# Patient Record
Sex: Female | Born: 1935 | Race: White | Hispanic: No | Smoking: Never smoker
Health system: Southern US, Community
[De-identification: ages and names within clinical notes are randomized; demographics above are authoritative.]

## PROBLEM LIST (undated history)

## (undated) DIAGNOSIS — I272 Pulmonary hypertension, unspecified: Secondary | ICD-10-CM

## (undated) DIAGNOSIS — I340 Nonrheumatic mitral (valve) insufficiency: Secondary | ICD-10-CM

## (undated) DIAGNOSIS — I350 Nonrheumatic aortic (valve) stenosis: Secondary | ICD-10-CM

## (undated) DIAGNOSIS — I2510 Atherosclerotic heart disease of native coronary artery without angina pectoris: Secondary | ICD-10-CM

## (undated) DIAGNOSIS — I10 Essential (primary) hypertension: Secondary | ICD-10-CM

## (undated) DIAGNOSIS — D649 Anemia, unspecified: Secondary | ICD-10-CM

## (undated) DIAGNOSIS — I447 Left bundle-branch block, unspecified: Secondary | ICD-10-CM

## (undated) DIAGNOSIS — I4891 Unspecified atrial fibrillation: Secondary | ICD-10-CM

## (undated) DIAGNOSIS — M1990 Unspecified osteoarthritis, unspecified site: Secondary | ICD-10-CM

## (undated) DIAGNOSIS — I5032 Chronic diastolic (congestive) heart failure: Secondary | ICD-10-CM

## (undated) DIAGNOSIS — I8393 Asymptomatic varicose veins of bilateral lower extremities: Secondary | ICD-10-CM

## (undated) DIAGNOSIS — R0600 Dyspnea, unspecified: Secondary | ICD-10-CM

## (undated) HISTORY — PX: OTHER SURGICAL HISTORY: SHX169

## (undated) HISTORY — DX: Anemia, unspecified: D64.9

## (undated) HISTORY — DX: Unspecified atrial fibrillation: I48.91

## (undated) HISTORY — DX: Essential (primary) hypertension: I10

## (undated) HISTORY — DX: Asymptomatic varicose veins of bilateral lower extremities: I83.93

## (undated) HISTORY — DX: Pulmonary hypertension, unspecified: I27.20

## (undated) HISTORY — DX: Nonrheumatic mitral (valve) insufficiency: I34.0

## (undated) HISTORY — DX: Chronic diastolic (congestive) heart failure: I50.32

## (undated) HISTORY — PX: CATARACT EXTRACTION: SUR2

## (undated) HISTORY — PX: TOTAL KNEE ARTHROPLASTY: SHX125

---

## 2004-12-31 ENCOUNTER — Ambulatory Visit (HOSPITAL_COMMUNITY): Admission: RE | Admit: 2004-12-31 | Discharge: 2004-12-31 | Payer: Self-pay | Admitting: Family Medicine

## 2009-02-07 ENCOUNTER — Ambulatory Visit: Payer: Self-pay | Admitting: Internal Medicine

## 2009-02-07 ENCOUNTER — Inpatient Hospital Stay (HOSPITAL_COMMUNITY): Admission: EM | Admit: 2009-02-07 | Discharge: 2009-02-11 | Payer: Self-pay | Admitting: Emergency Medicine

## 2009-02-10 ENCOUNTER — Encounter: Payer: Self-pay | Admitting: Cardiology

## 2009-02-11 ENCOUNTER — Telehealth (INDEPENDENT_AMBULATORY_CARE_PROVIDER_SITE_OTHER): Payer: Self-pay | Admitting: *Deleted

## 2009-02-12 ENCOUNTER — Ambulatory Visit: Payer: Self-pay

## 2009-02-12 ENCOUNTER — Encounter (HOSPITAL_COMMUNITY): Admission: RE | Admit: 2009-02-12 | Discharge: 2009-04-02 | Payer: Self-pay | Admitting: Internal Medicine

## 2009-02-12 ENCOUNTER — Ambulatory Visit: Payer: Self-pay | Admitting: Cardiovascular Disease

## 2009-04-02 ENCOUNTER — Ambulatory Visit: Payer: Self-pay | Admitting: Internal Medicine

## 2009-04-02 DIAGNOSIS — I5032 Chronic diastolic (congestive) heart failure: Secondary | ICD-10-CM | POA: Insufficient documentation

## 2009-04-02 DIAGNOSIS — I252 Old myocardial infarction: Secondary | ICD-10-CM

## 2009-04-02 DIAGNOSIS — I4891 Unspecified atrial fibrillation: Secondary | ICD-10-CM | POA: Insufficient documentation

## 2009-04-22 ENCOUNTER — Ambulatory Visit: Payer: Self-pay

## 2009-04-22 ENCOUNTER — Encounter: Payer: Self-pay | Admitting: Internal Medicine

## 2009-07-15 ENCOUNTER — Telehealth: Payer: Self-pay | Admitting: Internal Medicine

## 2009-07-29 ENCOUNTER — Telehealth (INDEPENDENT_AMBULATORY_CARE_PROVIDER_SITE_OTHER): Payer: Self-pay | Admitting: *Deleted

## 2009-08-24 ENCOUNTER — Telehealth: Payer: Self-pay | Admitting: Internal Medicine

## 2009-12-21 ENCOUNTER — Telehealth: Payer: Self-pay | Admitting: Internal Medicine

## 2009-12-21 ENCOUNTER — Emergency Department (HOSPITAL_COMMUNITY): Admission: EM | Admit: 2009-12-21 | Discharge: 2009-12-21 | Payer: Self-pay | Admitting: Emergency Medicine

## 2010-01-06 ENCOUNTER — Encounter: Payer: Self-pay | Admitting: Physician Assistant

## 2010-01-06 ENCOUNTER — Ambulatory Visit: Payer: Self-pay | Admitting: Cardiovascular Disease

## 2010-01-06 DIAGNOSIS — I10 Essential (primary) hypertension: Secondary | ICD-10-CM | POA: Insufficient documentation

## 2010-01-19 ENCOUNTER — Ambulatory Visit: Payer: Self-pay

## 2010-02-11 ENCOUNTER — Ambulatory Visit: Payer: Self-pay | Admitting: Internal Medicine

## 2010-02-16 ENCOUNTER — Telehealth: Payer: Self-pay | Admitting: Internal Medicine

## 2010-04-25 ENCOUNTER — Encounter: Payer: Self-pay | Admitting: Family Medicine

## 2010-04-25 ENCOUNTER — Encounter: Payer: Self-pay | Admitting: Gastroenterology

## 2010-05-04 NOTE — Progress Notes (Signed)
Summary: pt need pre autherization  Phone Note Refill Request Message from:  Patient on [facility]  Refills Requested: Medication #1:  PRADAXA 150 MG CAPS take one capsule two times a day Needs pre autherization/please call [phone] option 2  Initial call taken by: Omer Jack,  Aug 24, 2009 9:24 AM Caller: Patient  Follow-up for Phone Call        Pradaxa has been authorized for 1 year.  Pt notified Follow-up by: Judithe Modest CMA,  Aug 24, 2009 10:12 AM    Prescriptions: PRADAXA 150 MG CAPS (DABIGATRAN ETEXILATE MESYLATE) take one capsule two times a day  #180 x 3   Entered by:   Judithe Modest CMA   Authorized by:   Nathen May, MD, [facility]   Signed by:   Judithe Modest CMA on 08/24/2009   Method used:   Faxed to ...       [facility] (mail-order)             , [place]         Ph:        Fax: [phone]   RxID:   [id number]

## 2010-05-04 NOTE — Assessment & Plan Note (Signed)
Summary: NURSE VIST   Nurse Visit   Vital Signs:  Patient profile:   77 year old female Pulse rate:   77 / minute Pulse rhythm:   irregular Resp:     20 per minute BP sitting:   150 / 72  (left arm) Cuff size:   regular  Vitals Entered By: Sherri Rad, RN, BSN (April 22, 2009 11:49 AM)   Problems Prior to Update: 1)  Diastolic Heart Failure, Chronic  (ICD-428.32) 2)  Atrial Fibrillation  (ICD-427.31) 3)  Myocardial Infarction, Hx of  (ICD-412)  Current Medications (verified): 1)  Bufferin 325 Mg Tabs (Aspirin Buf(Cacarb-Mgcarb-Mgo)) .... Once Daily 2)  Vitamin B-12 100 Mcg Tabs (Cyanocobalamin) .... Take One Tablet Once Daily 3)  Klor-Con M20 20 Meq Cr-Tabs (Potassium Chloride Crys Cr) .... Take As Needed With Furosemide 4)  Furosemide 40 Mg Tabs (Furosemide) .... Take One Tablet As Needed 5)  Pradaxa 150 Mg-Patient Out Since Dec 13th .... Take One Tablet Once Daily 6)  Diltiazem Hcl Er Beads 120 Mg Xr24h-Cap (Diltiazem Hcl Er Beads) .... Take One Capsule Once Daily  Allergies (verified): No Known Drug Allergies       Visit Type:  Nurse Visit- Pulse check  CC:  The pt is without complaints today except for c/o nosebleeds. I verified the pt's medications and discussed this with Dr. Graciela Husbands. Per Dr. Graciela Husbands- it is ok for the pt to d/c ASA. Marland Kitchen

## 2010-05-04 NOTE — Assessment & Plan Note (Signed)
Summary: rov/ afib, flutter , htn untreated. pt has bcbs./medicare. g   Visit Type:  rov   History of Present Illness: This is an 77 year old white female patient who is here for routine followup. She has a history of atrial fibrillation, diastolic heart failure, and history of MI related to rapid atrial fibrillation and demand.  She comes in today because she needs her medications renewed. She denies any chest pain, palpitations, dyspnea, dyspnea on exertion, dizziness, or presyncope. She occasionally feels a flip in her heart but it is short lived and rare. 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  The patient's blood pressure is elevated today and she admits to eating a lot of salt.  Current Medications (verified): 1)  Bufferin 325 Mg Tabs (Aspirin Buf(Cacarb-Mgcarb-Mgo)) .... Once Daily 2)  Vitamin B-12 100 Mcg Tabs (Cyanocobalamin) .... Take One Tablet Once Daily 3)  Klor-Con M20 20 Meq Cr-Tabs (Potassium Chloride Crys Cr) .... Take As Needed With Furosemide 4)  Furosemide 40 Mg Tabs (Furosemide) .... Take One Tablet As Needed 5)  Pradaxa 150 Mg Caps (Dabigatran Etexilate Mesylate) .... Take One Capsule Two Times A Day 6)  Diltiazem Hcl Er Beads 120 Mg Xr24h-Cap (Diltiazem Hcl Er Beads) .... Take One Capsule Once Daily  Allergies (verified): No Known Drug Allergies  Past History:  Past Medical History: Last updated: 04/02/2009 Atrial fibrillation with rapid ventricular response Congestive heart failure consequence of atrial fibrillation with rapid ventricular response left bundle-branch block-left axis deviation  Social History: Reviewed history from 03/30/2009 and no changes required. Full Time Tobacco Use - No.  Alcohol Use - no Drug Use - no  Review of Systems       see history of present illness  Vital Signs:  Patient profile:   77 year old female Height:      62 inches Weight:      131.75 pounds BMI:     24.18 Pulse rate:   60 / minute BP sitting:   148  / 92  (left arm) Cuff size:   regular  Vitals Entered By: Caralee Ates CMA (January 06, 2010 10:03 AM)  Serial Vital Signs/Assessments:  Time      Position  BP       Pulse  Resp  Temp     By                     160/90                         Marletta Lor, PA-C   Physical Exam  General:   Well-nournished, in no acute distress. Neck: No JVD, HJR, Bruit, or thyroid enlargement Lungs: No tachypnea, clear without wheezing, rales, or rhonchi Cardiovascular irregular irregular, 2/6 systolic ejection murmur at the left sternal border and apex,no gallops, bruit, thrill, or heave. Abdomen: BS normal. Soft without organomegaly, masses, lesions or tenderness. Extremities: without cyanosis, clubbing or edema. Good distal pulses bilateral SKin: Warm, no lesions or rashes  Musculoskeletal: No deformities Neuro: no focal signs    EKG  Procedure date:  01/06/2010  Findings:      atrial fibrillation at 77 beats per minute left bundle branch block  Impression & Recommendations:  Problem # 1:  ATRIAL FIBRILLATION (ICD-427.31)  Patient has chronic atrial fibrillation with controlled ventricular rate. She continues on diltiazem and Pradaxa Her updated medication list for this problem includes:    Bufferin 325 Mg Tabs (Aspirin buf(cacarb-mgcarb-mgo)) .Marland KitchenMarland KitchenMarland KitchenMarland Kitchen  Once daily  Orders: EKG w/ Interpretation (93000)  Problem # 2:  DIASTOLIC HEART FAILURE, CHRONIC (ICD-428.32) patient has history of diastolic heart failure but is currently compensated. Her updated medication list for this problem includes:    Bufferin 325 Mg Tabs (Aspirin buf(cacarb-mgcarb-mgo)) ..... Once daily    Furosemide 40 Mg Tabs (Furosemide) .Marland Kitchen... Take one tablet as needed    Diltiazem Hcl Er Beads 120 Mg Xr24h-cap (Diltiazem hcl er beads) .Marland Kitchen... Take one capsule once daily  Problem # 3:  HYPERTENSION, BENIGN (ICD-401.1) Patient's blood pressure is elevated today and was higher on retake. She wants to try a low-sodium  diet prior to adjusting her medications. She will return in 2 weeks to the nurses for blood pressure check Her updated medication list for this problem includes:    Bufferin 325 Mg Tabs (Aspirin buf(cacarb-mgcarb-mgo)) ..... Once daily    Furosemide 40 Mg Tabs (Furosemide) .Marland Kitchen... Take one tablet as needed    Diltiazem Hcl Er Beads 120 Mg Xr24h-cap (Diltiazem hcl er beads) .Marland Kitchen... Take one capsule once daily  Patient Instructions: 1)  Dr. Graciela Husbands 6-9 months 2)  Your physician has requested that you limit the intake of sodium (salt) in your diet to two grams daily. Please see [facility] handout. 3)   Nurses visit in 2 weeks for blood pressure check 4)  Your physician recommends that you continue on your current medications as directed. Please refer to the Current Medication list given to you today. Prescriptions: DILTIAZEM HCL ER BEADS 120 MG XR24H-CAP (DILTIAZEM HCL ER BEADS) take one capsule once daily  #90 x 3   Entered by:   Ollen Gross, RN, BSN   Authorized by:   Marletta Lor, PA-C   Signed by:   Ollen Gross, RN, BSN on 01/06/2010   Method used:   Faxed to ...       [facility] (mail-order)             , [place]         Ph:        Fax: [phone]   RxID:   [id number] PRADAXA 150 MG CAPS (DABIGATRAN ETEXILATE MESYLATE) take one capsule two times a day  #180 x 3   Entered by:   Ollen Gross, RN, BSN   Authorized by:   Marletta Lor, PA-C   Signed by:   Ollen Gross, RN, BSN on 01/06/2010   Method used:   Faxed to ...       [facility] (mail-order)             , [place]         Ph:        Fax: [phone]   RxID:   [id number] FUROSEMIDE 40 MG TABS (FUROSEMIDE) take one tablet as needed  #0 x 3   Entered by:   Ollen Gross, RN, BSN   Authorized by:   Marletta Lor, PA-C   Signed by:   Ollen Gross, RN, BSN on 01/06/2010   Method used:   Faxed to ...       [facility] (mail-order)             , [place]         Ph:        Fax: [phone]   RxID:   [id number] KLOR-CON M20 20 MEQ CR-TABS (POTASSIUM CHLORIDE CRYS CR) take as needed with furosemide  #90 x 3  Entered by:   Ollen Gross, RN, BSN   Authorized by:   Marletta Lor, PA-C   Signed by:   Ollen Gross, RN, BSN on 01/06/2010   Method used:   Faxed to ...       [facility] (mail-order)             , [place]         Ph:        Fax: [phone]   RxID:   [id number]

## 2010-05-04 NOTE — Assessment & Plan Note (Signed)
Summary: per check out with nurse/med increased/saf   Visit Type:  Follow-up-Meds  CC:  no complaints.  History of Present Illness: Jody [name] is seen in followup for elevated blood pressure.  She has a history of atrial fibrillation diastolic heart failure and a type II MI. SHe seen a month ago by Jody [name]. The patient decided she would like to try and low-salt diet.    Blood pressure was noted to be elevated; a couple weeks later diltiazem was started per Dr. Myrtis Ser. She is here in followup for this. The patient denies SOB, chest pain, edema or palpitations   Problems Prior to Update: 1)  Hypertension, Benign  (ICD-401.1) 2)  Diastolic Heart Failure, Chronic  (ICD-428.32) 3)  Atrial Fibrillation  (ICD-427.31) 4)  Myocardial Infarction, Hx of  (ICD-412)  Current Medications (verified): 1)  Bufferin 325 Mg Tabs (Aspirin Buf(Cacarb-Mgcarb-Mgo)) .... Once Daily 2)  Vitamin B-12 100 Mcg Tabs (Cyanocobalamin) .... Take One Tablet Once Daily 3)  Klor-Con M20 20 Meq Cr-Tabs (Potassium Chloride Crys Cr) .... Take One Tablet Once Daily As Needed With Furosemide 4)  Furosemide 40 Mg Tabs (Furosemide) .... Take One Tablet Once Daily As Needed For Fluid 5)  Pradaxa 150 Mg Caps (Dabigatran Etexilate Mesylate) .... Take One Capsule Two Times A Day 6)  Diltiazem Hcl Er Beads 120 Mg Xr24h-Cap (Diltiazem Hcl Er Beads) .... Take One Capsule  Twice A Daily  Allergies (verified): No Known Drug Allergies  Past History:  Past Medical History: Last updated: 04/02/2009 Atrial fibrillation with rapid ventricular response Congestive heart failure consequence of atrial fibrillation with rapid ventricular response left bundle-branch block-left axis deviation  Family History: Last updated: 03/30/2009  Her family history is noncontributory  Social History: Last updated: 03/30/2009 Full Time Tobacco Use - No.  Alcohol Use - no Drug Use - no  Risk Factors: Smoking Status: never  (03/30/2009)  Vital Signs:  Patient profile:   77 year old female Height:      62 inches Weight:      132.25 pounds BMI:     24.28 Pulse rate:   79 / minute BP sitting:   133 / 79  (left arm) Cuff size:   regular  Vitals Entered By: Caralee Ates CMA (February 11, 2010 8:41 AM)  Physical Exam  General:  The patient was alert and oriented in no acute distress.Neck veins were flat, carotids were brisk. Lungs were clear. Heart sounds were irregular without murmurs or gallops. Abdomen was soft with active bowel sounds. There is no clubbing cyanosis or edema.    Impression & Recommendations:  Problem # 1:  ATRIAL FIBRILLATION (ICD-427.31) She is currently on Pradaxa. She is tolerating it without GI symptoms. We will discontinue her aspirin at this time to reduce the risk of bleeding. The following medications were removed from the medication list:    Bufferin 325 Mg Tabs (Aspirin buf(cacarb-mgcarb-mgo)) ..... Once daily  Problem # 2:  HYPERTENSION, BENIGN (ICD-401.1) Hblood pressure is much improved on b.i.d. diltiazem. We'll continue her on her current medications. We reviewed the side effects.  The following medications were removed from the medication list:    Bufferin 325 Mg Tabs (Aspirin buf(cacarb-mgcarb-mgo)) ..... Once daily Her updated medication list for this problem includes:    Furosemide 40 Mg Tabs (Furosemide) .Marland Kitchen... Take one tablet once daily as needed for fluid    Diltiazem Hcl Er Beads 120 Mg Xr24h-cap (Diltiazem hcl er beads) .Marland Kitchen... Take one capsule  twice a daily  Patient Instructions:  1)  Your physician wants you to follow-up in: 1 year  You will receive a reminder letter in the mail two months in advance. If you don't receive a letter, please call our office to schedule the follow-up appointment. 2)  Your physician has recommended you make the following change in your medication: Stop Aspirin

## 2010-05-04 NOTE — Progress Notes (Signed)
Summary: REFILL MEDS  Phone Note Refill Request Call back at Home Phone [phone] Message from:  Patient on February 16, 2010 9:42 AM  Refills Requested: Medication #1:  DILTIAZEM HCL ER BEADS 120 MG XR24H-CAP take one capsule  twice a daily. [facility] PHONE # [phone].OR [phone]. FAX # [phone].   Method Requested: Fax to Mail Away Pharmacy Initial call taken by: Lorne Skeens,  February 16, 2010 9:44 AM  Follow-up for Phone Call       Follow-up by: Judithe Modest CMA,  February 16, 2010 10:03 AM    Prescriptions: DILTIAZEM HCL ER BEADS 120 MG XR24H-CAP (DILTIAZEM HCL ER BEADS) take one capsule  twice a daily  #60 x 6   Entered by:   Judithe Modest CMA   Authorized by:   Nathen May, MD, [facility]   Signed by:   Judithe Modest CMA on 02/16/2010   Method used:   Faxed to ...       [facility] (mail-order)             , [place]         Ph:        Fax: [phone]   RxID:   [id number]

## 2010-05-04 NOTE — Miscellaneous (Signed)
Summary: [facility]   Imported By: [facility] 01/06/2010 09:54:05  _____________________________________________________________________  External Attachment:    Type:   Image     Comment:   External Document

## 2010-05-04 NOTE — Progress Notes (Signed)
  Walk in Patient Form Recieved " pt left Information about Pradaxa" sent to Message Nurse [facility]  July 29, 2009 3:03 PM

## 2010-05-04 NOTE — Progress Notes (Signed)
Summary: PT NEED ALL NEW PRESCRIPTION CHANGE INSURANCE   Phone Note Call from Patient Call back at Home Phone [phone]   Caller: Patient Summary of Call: PT HAS CHANGED TO [facility] AND NEED ALL NEW PRESCRIPTION FAX TO [phone] AND PHONE #[phone] FOR AUTHORIZATION [phone] (PRADAXA 150MG , DILTIAZEM 120MG  WITH 90 SUPPLY) Initial call taken by: Judie Grieve,  July 15, 2009 10:01 AM    New/Updated Medications: PRADAXA 150 MG CAPS (DABIGATRAN ETEXILATE MESYLATE) take one capsule two times a day Prescriptions: DILTIAZEM HCL ER BEADS 120 MG XR24H-CAP (DILTIAZEM HCL ER BEADS) take one capsule once daily  #90 x 3   Entered by:   Judithe Modest CMA   Authorized by:   Nathen May, MD, [facility]   Signed by:   Judithe Modest CMA on 07/16/2009   Method used:   Faxed to ...       [facility] (mail-order)             , [place]         Ph:        Fax: [phone]   RxID:   [id number] PRADAXA 150 MG CAPS (DABIGATRAN ETEXILATE MESYLATE) take one capsule two times a day  #180 x 3   Entered by:   Judithe Modest CMA   Authorized by:   Nathen May, MD, [facility]   Signed by:   Judithe Modest CMA on 07/16/2009   Method used:   Faxed to ...       [facility] (mail-order)             , [place]         Ph:        Fax: [phone]   RxID:   [id number]   Appended Document: PT NEED ALL NEW PRESCRIPTION CHANGE INSURANCE  Spoke to patients daughter regarding Pradaxa.  Pt taking two times a day and medication updated and refilled.  Sent to [facility].  AT,CMA  07/16/2009

## 2010-05-04 NOTE — Assessment & Plan Note (Signed)
Summary: BLOOD PRESSURE/SL  Nurse Visit   Vital Signs:  Patient profile:   77 year old female Height:      62 inches Weight:      128.12 pounds BMI:     23.52 Pulse rate:   66 / minute BP sitting:   160 / 104  (left arm)  Impression & Recommendations:  Problem # 1:  HYPERTENSION, BENIGN (ICD-401.1)  Her updated medication list for this problem includes:    Bufferin 325 Mg Tabs (Aspirin buf(cacarb-mgcarb-mgo)) ..... Once daily    Furosemide 40 Mg Tabs (Furosemide) .Marland Kitchen... Take one tablet once daily as needed for fluid    Diltiazem Hcl Er Beads 120 Mg Xr24h-cap (Diltiazem hcl er beads) .Marland Kitchen... Take one capsule  twice a daily Pt. in for B/P check. B/P right arm 156/98, left arm 160/104, Pulse 66 beats/min. Pt. took Diltiazem HCL 120 mg this AM. Pt. is following a 2 GM NA diet. Pt. states has taken Furosemide 40 mg as needed, a month ego.  Dr. Myrtis Ser DOD recommeded for pt. to take Diltiazem 120 mg twice a day and to have a f/u visit with the PA or her MD in 2 to 3 weeks.  writing instructions given to pt. and doughter.   Allergies: No Known Drug Allergies Prescriptions: DILTIAZEM HCL ER BEADS 120 MG XR24H-CAP (DILTIAZEM HCL ER BEADS) take one capsule  twice a daily  #60 x 6   Entered by:   Ollen Gross, RN, BSN   Authorized by:   Talitha Givens, MD, [facility]   Signed by:   Ollen Gross, RN, BSN on 01/19/2010   Method used:   Print then Give to Patient   RxID:   [id number]

## 2010-05-04 NOTE — Progress Notes (Signed)
Summary: chest pain under left breast  Phone Note Call from Patient   Caller: Patient Reason for Call: Talk to Nurse Summary of Call: pt having pain on left side under breast, saw pcp today and bp elevated and said her heart was racing, and pt has a headache-pls call [phone] Initial call taken by: Glynda Jaeger,  December 21, 2009 2:16 PM  Follow-up for Phone Call        spoke w/pt daughter and pt had some breathing test today related to her work and has the worst headache at this moment, is unable to remember how high her blood pressure was when taken this morning.  because of pts c/o severe head ("my head is about to blow off") and unknown elevated b/p levels, the concern is risk of stroke. pt is 45 mins from hosp but see medical help att. Claris Gladden, RN, BSN 9/19 1430     Appended Document: chest pain under left breast trish adv pt on way. Claris Gladden, RN, BSN

## 2010-06-17 LAB — POCT I-STAT, CHEM 8
Chloride: 105 mEq/L (ref 96–112)
HCT: 40 % (ref 36.0–46.0)
Hemoglobin: 13.6 g/dL (ref 12.0–15.0)
Potassium: 4 mEq/L (ref 3.5–5.1)
Sodium: 140 mEq/L (ref 135–145)

## 2010-06-17 LAB — POCT CARDIAC MARKERS
CKMB, poc: <1 ng/mL — ABNORMAL LOW (ref 1.0–8.0)
Myoglobin, poc: 25 ng/mL (ref 12–200)
Troponin i, poc: <0.05 ng/mL (ref 0.00–0.09)

## 2010-07-07 LAB — BASIC METABOLIC PANEL
BUN: 16 mg/dL (ref 6–23)
BUN: 17 mg/dL (ref 6–23)
CO2: 30 mEq/L (ref 19–32)
Chloride: 97 mEq/L (ref 96–112)
Creatinine, Ser: 0.83 mg/dL (ref 0.4–1.2)
Creatinine, Ser: 0.91 mg/dL (ref 0.4–1.2)
GFR calc non Af Amer: 60 mL/min — ABNORMAL LOW (ref >60)

## 2010-07-07 LAB — CBC
HCT: 40.9 % (ref 36.0–46.0)
Hemoglobin: 13.5 g/dL (ref 12.0–15.0)
Hemoglobin: 14 g/dL (ref 12.0–15.0)
MCHC: 33.9 g/dL (ref 30.0–36.0)
MCHC: 34.2 g/dL (ref 30.0–36.0)
MCHC: 34.3 g/dL (ref 30.0–36.0)
MCV: 94.1 fL (ref 78.0–100.0)
MCV: 94.2 fL (ref 78.0–100.0)
MCV: 94.6 fL (ref 78.0–100.0)
MCV: 94.6 fL (ref 78.0–100.0)
MCV: 94.8 fL (ref 78.0–100.0)
Platelets: 207 10*3/uL (ref 150–400)
Platelets: 212 10*3/uL (ref 150–400)
Platelets: 226 10*3/uL (ref 150–400)
Platelets: 242 10*3/uL (ref 150–400)
RBC: 4.16 MIL/uL (ref 3.87–5.11)
RDW: 15.1 % (ref 11.5–15.5)
WBC: 3.8 10*3/uL — ABNORMAL LOW (ref 4.0–10.5)
WBC: 4.2 10*3/uL (ref 4.0–10.5)
WBC: 5.7 10*3/uL (ref 4.0–10.5)

## 2010-07-07 LAB — COMPREHENSIVE METABOLIC PANEL
BUN: 11 mg/dL (ref 6–23)
CO2: 28 mEq/L (ref 19–32)
Calcium: 8.5 mg/dL (ref 8.4–10.5)
Chloride: 106 mEq/L (ref 96–112)
Creatinine, Ser: 0.62 mg/dL (ref 0.4–1.2)
GFR calc non Af Amer: >60 mL/min (ref >60)
Glucose, Bld: 104 mg/dL — ABNORMAL HIGH (ref 70–99)
Total Bilirubin: 0.7 mg/dL (ref 0.3–1.2)

## 2010-07-07 LAB — CARDIAC PANEL(CRET KIN+CKTOT+MB+TROPI)
CK, MB: 2.4 ng/mL (ref 0.3–4.0)
CK, MB: 5.3 ng/mL — ABNORMAL HIGH (ref 0.3–4.0)
Relative Index: 2.2 (ref 0.0–2.5)
Relative Index: 2.2 (ref 0.0–2.5)
Relative Index: INVALID (ref 0.0–2.5)
Total CK: 71 U/L (ref 7–177)
Troponin I: 0.25 ng/mL — ABNORMAL HIGH (ref 0.00–0.06)
Troponin I: 0.46 ng/mL — ABNORMAL HIGH (ref 0.00–0.06)

## 2010-07-07 LAB — DIFFERENTIAL
Basophils Relative: 0 % (ref 0–1)
Lymphocytes Relative: 25 % (ref 12–46)
Lymphs Abs: 1.4 10*3/uL (ref 0.7–4.0)
Neutrophils Relative %: 65 % (ref 43–77)

## 2010-07-07 LAB — HEPARIN LEVEL (UNFRACTIONATED)
Heparin Unfractionated: 0.46 IU/mL (ref 0.30–0.70)
Heparin Unfractionated: 0.73 IU/mL — ABNORMAL HIGH (ref 0.30–0.70)

## 2010-07-07 LAB — PROTIME-INR: INR: 0.94 (ref 0.00–1.49)

## 2010-07-07 LAB — LIPID PANEL
LDL Cholesterol: 135 mg/dL — ABNORMAL HIGH (ref 0–99)
Triglycerides: 88 mg/dL (ref <150)

## 2010-07-07 LAB — CK TOTAL AND CKMB (NOT AT ARMC): Relative Index: INVALID (ref 0.0–2.5)

## 2010-08-25 ENCOUNTER — Telehealth: Payer: Self-pay | Admitting: Internal Medicine

## 2010-08-25 MED ORDER — DILTIAZEM HCL 120 MG PO TABS: 120.0000 mg | ORAL_TABLET | Freq: Two times a day (BID) | ORAL | Status: DC

## 2010-08-25 NOTE — Telephone Encounter (Signed)
RX sent into pharmacy. Pt daughter notified. CTuck

## 2010-08-25 NOTE — Telephone Encounter (Signed)
Pt needs refill of Diltiazem 120mg  [facility]

## 2010-09-02 ENCOUNTER — Telehealth: Payer: Self-pay | Admitting: Internal Medicine

## 2010-09-02 NOTE — Telephone Encounter (Signed)
Pharmacist has question re pt diltiazem. [facility] # [id number] ref# [id number]

## 2010-09-03 ENCOUNTER — Telehealth: Payer: Self-pay | Admitting: Internal Medicine

## 2010-09-03 NOTE — Telephone Encounter (Signed)
Spoke with pharmacist. They will fill rx. Pt daughter notified.

## 2010-09-03 NOTE — Telephone Encounter (Signed)
Pts daughter stated that [facility] called about 4 days ago about a question they have on Diltiazem.  The pharmacy stated that no one had called them back regarding same.   Pt only has 8 more days of meds left.  Please call the phar. Line [phone] to give them the information that they are requesting so patient can get meds as soon as possible.

## 2010-09-03 NOTE — Telephone Encounter (Signed)
DUPLICATE

## 2011-04-12 ENCOUNTER — Ambulatory Visit (INDEPENDENT_AMBULATORY_CARE_PROVIDER_SITE_OTHER): Payer: Medicare Other | Admitting: Internal Medicine

## 2011-04-12 ENCOUNTER — Encounter: Payer: Self-pay | Admitting: Internal Medicine

## 2011-04-12 VITALS — BP 161/90 | HR 71 | Ht 63.0 in | Wt 131.0 lb

## 2011-04-12 DIAGNOSIS — I5032 Chronic diastolic (congestive) heart failure: Secondary | ICD-10-CM

## 2011-04-12 DIAGNOSIS — I509 Heart failure, unspecified: Secondary | ICD-10-CM

## 2011-04-12 DIAGNOSIS — I10 Essential (primary) hypertension: Secondary | ICD-10-CM | POA: Insufficient documentation

## 2011-04-12 DIAGNOSIS — I4891 Unspecified atrial fibrillation: Secondary | ICD-10-CM

## 2011-04-12 LAB — BASIC METABOLIC PANEL
CO2: 29 mEq/L (ref 19–32)
Calcium: 8.9 mg/dL (ref 8.4–10.5)
GFR: 68.05 mL/min (ref >60.00)
Potassium: 3.8 mEq/L (ref 3.5–5.1)
Sodium: 143 mEq/L (ref 135–145)

## 2011-04-12 NOTE — Assessment & Plan Note (Signed)
Atrial fibrillation appears permanent. She is dABIGITRAN . We'll check a renal function today. She has no dyspepsia

## 2011-04-12 NOTE — Assessment & Plan Note (Signed)
Stable on current medical regime

## 2011-04-12 NOTE — Patient Instructions (Signed)
Your physician wants you to follow-up in: 1 year with Dr. Graciela Husbands. You will receive a reminder letter in the mail two months in advance. If you don't receive a letter, please call our office to schedule the follow-up appointment.  Your physician recommends that you continue on your current medications as directed. Please refer to the Current Medication list given to you today.  Your physician recommends that you have lab work today: bmp (427.31)

## 2011-04-12 NOTE — Progress Notes (Signed)
   Patient ID: Jody [name], female    DOB: Jun 01, 1927, 78 y.o.   MRN: [id number]  HPI    Review of Systems    Physical Exam

## 2011-04-12 NOTE — Progress Notes (Signed)
  HPI  Jody [name] is a 78 y.o. female is seen in followup for atrial fibrillation with a rapid ventricular response.   This has been  complicated by congestive heart failure with preserved ejection fraction. In 2010 she  underwent nuclear scanning demonstrating normal perfusion and normal left ventricular function.  Shehas hypertension  The patient denies chest pain, shortness of breath, nocturnal dyspnea, orthopnea or peripheral edema.  There have been no palpitations, lightheadedness or syncope.  Denies stomach complaints   No past medical history on file.  No past surgical history on file.  Current Outpatient Prescriptions  Medication Sig Dispense Refill  . dabigatran (PRADAXA) 150 MG CAPS Take 150 mg by mouth daily.        Marland Kitchen diltiazem (CARDIZEM) 120 MG tablet Take 1 tablet (120 mg total) by mouth 2 (two) times daily.  180 tablet  3  . naproxen sodium (ANAPROX) 220 MG tablet Take 110 mg by mouth 2 (two) times daily at 10 AM and 5 PM.        . vitamin B-12 (CYANOCOBALAMIN) 100 MCG tablet Take 50 mcg by mouth daily.          Allergies no known allergies  Review of Systems negative except from HPI and PMH  Physical Exam Ht 5\' 3"  (1.6 m)  Wt 131 lb (59.421 kg)  BMI 23.21 kg/m2 Well developed and well nourished in no acute distress HENT normal E scleral and icterus clear Neck Supple JVP flat; carotids brisk and full Clear to ausculation irregularly irregular 2-3/6 systolic murmur whichdoes not change with RR interval S2 preserved Soft with active bowel sounds No clubbing cyanosis none Edema Alert and oriented, grossly normal motor and sensory function Skin Warm and Dry  ECG  AFib witgh CVresponse and LBBB  Assessment and  Plan]

## 2011-04-25 ENCOUNTER — Other Ambulatory Visit: Payer: Self-pay | Admitting: Internal Medicine

## 2011-04-25 MED ORDER — DABIGATRAN ETEXILATE MESYLATE 150 MG PO CAPS: 150.0000 mg | ORAL_CAPSULE | Freq: Two times a day (BID) | ORAL | Status: DC

## 2011-04-25 NOTE — Telephone Encounter (Signed)
Refill   Patient daughter f/u on patient medication refill.  She can be reached at [phone], please f/u with daughter Maggie Schwalbe concerning medication

## 2011-04-25 NOTE — Telephone Encounter (Signed)
Fax to [facility]. [phone]

## 2011-07-27 ENCOUNTER — Telehealth: Payer: Self-pay | Admitting: Internal Medicine

## 2011-07-27 ENCOUNTER — Other Ambulatory Visit: Payer: Self-pay | Admitting: *Deleted

## 2011-07-27 MED ORDER — DABIGATRAN ETEXILATE MESYLATE 150 MG PO CAPS: 150.0000 mg | ORAL_CAPSULE | Freq: Two times a day (BID) | ORAL | Status: DC

## 2011-07-27 MED ORDER — DILTIAZEM HCL 120 MG PO TABS: 120.0000 mg | ORAL_TABLET | Freq: Two times a day (BID) | ORAL | Status: DC

## 2011-07-27 NOTE — Telephone Encounter (Signed)
Patient needs all of her prescriptions written out for the year and sent in to [facility].  She can be reached at [phone]

## 2011-08-05 ENCOUNTER — Telehealth: Payer: Self-pay | Admitting: Internal Medicine

## 2011-08-05 NOTE — Telephone Encounter (Signed)
Option 1 to talk to pharmacy ref #[id number] re question on diltiazem

## 2011-08-08 ENCOUNTER — Telehealth: Payer: Self-pay | Admitting: Internal Medicine

## 2011-08-08 NOTE — Telephone Encounter (Signed)
Called and spoke to pharmacist to refill dilt.   No follow up needed.

## 2011-08-08 NOTE — Telephone Encounter (Signed)
[facility] calling re diltiazem , pls call has questions [phone] ref [id number]

## 2012-05-03 ENCOUNTER — Ambulatory Visit: Payer: Medicare Other | Admitting: Internal Medicine

## 2012-05-16 ENCOUNTER — Ambulatory Visit: Payer: Medicare Other | Admitting: Internal Medicine

## 2012-07-02 ENCOUNTER — Encounter: Payer: Self-pay | Admitting: Internal Medicine

## 2012-07-03 ENCOUNTER — Ambulatory Visit (INDEPENDENT_AMBULATORY_CARE_PROVIDER_SITE_OTHER): Payer: Medicare Other | Admitting: Internal Medicine

## 2012-07-03 ENCOUNTER — Encounter: Payer: Self-pay | Admitting: Internal Medicine

## 2012-07-03 VITALS — BP 138/78 | HR 80 | Ht 63.0 in | Wt 124.0 lb

## 2012-07-03 DIAGNOSIS — I5032 Chronic diastolic (congestive) heart failure: Secondary | ICD-10-CM

## 2012-07-03 DIAGNOSIS — I10 Essential (primary) hypertension: Secondary | ICD-10-CM

## 2012-07-03 LAB — BASIC METABOLIC PANEL
CO2: 29 mEq/L (ref 19–32)
Calcium: 8.8 mg/dL (ref 8.4–10.5)
Chloride: 105 mEq/L (ref 96–112)
Glucose, Bld: 87 mg/dL (ref 70–99)
Potassium: 5 mEq/L (ref 3.5–5.1)
Sodium: 139 mEq/L (ref 135–145)

## 2012-07-03 MED ORDER — DILTIAZEM HCL 120 MG PO TABS: 120.0000 mg | ORAL_TABLET | Freq: Two times a day (BID) | ORAL | Status: DC

## 2012-07-03 NOTE — Assessment & Plan Note (Signed)
Permanent. On Pradaxa. We will check her renal function today to make sure the dosing is appropriate for this 79 year old woman who weighs 56 kg

## 2012-07-03 NOTE — Progress Notes (Signed)
Patient has no care team.   HPI  Jody [name] is a 79 y.o. female is seen in followup for atrial fibrillation with a rapid ventricular response.  This has been complicated by congestive heart failure with preserved ejection fraction. In 2010 she underwent nuclear scanning demonstrating normal perfusion and normal left ventricular function. An echocardiogram 2010 also demonstrated normal left ventricular function. It was a TEE. No aortic stenosis was identified. She did have moderate mitral regurgitation and moderate LAE She has hypertension  The patient denies chest pain, shortness of breath, nocturnal dyspnea, orthopnea or peripheral edema. There have been no palpitations, lightheadedness or syncope.  Denies stomach complaints   Past Medical History  Diagnosis Date  . Afib   . HTN (hypertension)   . Chronic diastolic heart failure     History reviewed. No pertinent past surgical history.  Current Outpatient Prescriptions  Medication Sig Dispense Refill  . dabigatran (PRADAXA) 150 MG CAPS Take 1 capsule (150 mg total) by mouth every 12 (twelve) hours.  180 capsule  3  . diltiazem (CARDIZEM) 120 MG tablet Take 1 tablet (120 mg total) by mouth 2 (two) times daily.  180 tablet  3  . naproxen sodium (ANAPROX) 220 MG tablet Take 110 mg by mouth 2 (two) times daily at 10 AM and 5 PM.        . vitamin B-12 (CYANOCOBALAMIN) 100 MCG tablet Take 50 mcg by mouth daily.         No current facility-administered medications for this visit.    No Known Allergies  Review of Systems negative except from HPI and PMH  Physical Exam BP 138/78  Pulse 80  Ht 5\' 3"  (1.6 m)  Wt 124 lb (56.246 kg)  BMI 21.97 kg/m2  SpO2 99% Well developed and well nourished in no acute distress HENT normal E scleral and icterus clear Neck Supple JVP flat; carotids mildly delayed Clear to ausculation   Irregular rate and rhythm, 2-3 m with preserved S2 Soft with active bowel sounds No clubbing cyanosis  Trace, non-pitting and none Edema Alert and oriented, grossly normal motor and sensory function Skin Warm and Dry   ECG demonstrates atrial fibrillation at 68 intervals-/15/43 Axis - 45  Assessment and  Plan

## 2012-07-03 NOTE — Assessment & Plan Note (Signed)
Well controlled 

## 2012-07-03 NOTE — Patient Instructions (Addendum)
Your physician recommends that you have lab work today: bmp  Your physician has requested that you have an echocardiogram in 1 year. Echocardiography is a painless test that uses sound waves to create images of your heart. It provides your doctor with information about the size and shape of your heart and how well your heart's chambers and valves are working. This procedure takes approximately one hour. There are no restrictions for this procedure.  Your physician wants you to follow-up in: 1 year with Dr. Graciela Husbands. You will receive a reminder letter in the mail two months in advance. If you don't receive a letter, please call our office to schedule the follow-up appointment.

## 2012-07-03 NOTE — Assessment & Plan Note (Signed)
euvolemic 

## 2012-07-22 ENCOUNTER — Encounter: Payer: Self-pay | Admitting: Physician Assistant

## 2012-07-22 DIAGNOSIS — R112 Nausea with vomiting, unspecified: Secondary | ICD-10-CM

## 2012-07-23 ENCOUNTER — Encounter: Payer: Self-pay | Admitting: Physician Assistant

## 2012-07-24 ENCOUNTER — Encounter: Payer: Self-pay | Admitting: Physician Assistant

## 2012-07-24 DIAGNOSIS — I059 Rheumatic mitral valve disease, unspecified: Secondary | ICD-10-CM

## 2012-07-24 DIAGNOSIS — J9600 Acute respiratory failure, unspecified whether with hypoxia or hypercapnia: Secondary | ICD-10-CM

## 2012-07-24 DIAGNOSIS — I214 Non-ST elevation (NSTEMI) myocardial infarction: Secondary | ICD-10-CM

## 2012-07-24 DIAGNOSIS — I5033 Acute on chronic diastolic (congestive) heart failure: Secondary | ICD-10-CM

## 2012-07-25 DIAGNOSIS — I4891 Unspecified atrial fibrillation: Secondary | ICD-10-CM

## 2012-07-26 DIAGNOSIS — I214 Non-ST elevation (NSTEMI) myocardial infarction: Secondary | ICD-10-CM

## 2012-08-01 ENCOUNTER — Telehealth: Payer: Self-pay | Admitting: Internal Medicine

## 2012-08-01 DIAGNOSIS — I5032 Chronic diastolic (congestive) heart failure: Secondary | ICD-10-CM

## 2012-08-01 DIAGNOSIS — I10 Essential (primary) hypertension: Secondary | ICD-10-CM

## 2012-08-01 NOTE — Telephone Encounter (Signed)
New problem ° ° °Pt returning your call. °

## 2012-08-01 NOTE — Telephone Encounter (Signed)
Spoke with pt, aware of bmp results. She will return in two weeks for repeat bmp

## 2012-08-14 ENCOUNTER — Encounter: Payer: Medicare Other | Admitting: Internal Medicine

## 2012-08-17 ENCOUNTER — Encounter: Payer: Self-pay | Admitting: Physician Assistant

## 2012-08-17 ENCOUNTER — Ambulatory Visit (INDEPENDENT_AMBULATORY_CARE_PROVIDER_SITE_OTHER): Payer: Medicare Other | Admitting: Physician Assistant

## 2012-08-17 VITALS — BP 142/88 | HR 82 | Ht 63.0 in | Wt 121.4 lb

## 2012-08-17 DIAGNOSIS — I059 Rheumatic mitral valve disease, unspecified: Secondary | ICD-10-CM

## 2012-08-17 DIAGNOSIS — I5032 Chronic diastolic (congestive) heart failure: Secondary | ICD-10-CM

## 2012-08-17 DIAGNOSIS — I10 Essential (primary) hypertension: Secondary | ICD-10-CM

## 2012-08-17 DIAGNOSIS — I4891 Unspecified atrial fibrillation: Secondary | ICD-10-CM

## 2012-08-17 DIAGNOSIS — I340 Nonrheumatic mitral (valve) insufficiency: Secondary | ICD-10-CM | POA: Insufficient documentation

## 2012-08-17 DIAGNOSIS — D649 Anemia, unspecified: Secondary | ICD-10-CM

## 2012-08-17 DIAGNOSIS — Z79899 Other long term (current) drug therapy: Secondary | ICD-10-CM

## 2012-08-17 MED ORDER — FUROSEMIDE 20 MG PO TABS: 20.0000 mg | ORAL_TABLET | Freq: Every day | ORAL | Status: DC

## 2012-08-17 NOTE — Progress Notes (Addendum)
Primary Cardiologist: Sherryl Manges, MD   HPI: Post hospital followup from [facility], status post evaluation for abnormal troponins in the setting of acute respiratory failure.  Patient presented with acute GIB with a Hgb of 6.8, requiring PRBC transfusion. Patient was on Pradaxa for stroke prophylaxis, with known permanent atrial fibrillation. She subsequently developed elevated HR of approximately 160 bpm, requiring treatment with IV diltiazem. She was also treated with IV Lasix for acute DHF. We recommended in-house evaluation consisting of echocardiography and pharmacologic nuclear imaging, to rule out underlying ischemia.   - Echocardiogram, April 22: EF 60%; moderate LVH; inferobasal/inferior wall HK; grade 2 diastolic dysfunction; moderate MR; mild AS; moderate TR; moderate PHTN   - Lexiscan Cardiolite, April 22: No evidence of scar/ischemia; EF 60%   Patient was thus cleared to undergo complete GI evaluation consisting of upper and lower endoscopy, notable for mild gastritis and Schatzki ring, but no overt bleeding. Patient was discharged off Pradaxa, and on low-dose ASA.  She presents today reporting no evidence of overt bleeding. She denies exertional CP or tachycardia palpitations. She does report occasional DOE, but denies orthopnea, PND, or worsening peripheral edema. Of note, she does take Lasix prn and has done so for years. She currently has lost 3 pounds since her recent hospitalization.   Patient has also since followed up Dr. Teena Dunk, her gastroenterologist. She does not recall being told that she could go back on Pradaxa, but did resume taking it this morning. Of note, she is also on low-dose ASA.  No Known Allergies  Current Outpatient Prescriptions  Medication Sig Dispense Refill  . aspirin 81 MG chewable tablet Chew 81 mg by mouth daily.      Marland Kitchen diltiazem (CARDIZEM) 120 MG tablet Take 120 mg by mouth daily.      . Ferrous Sulfate Dried (SLOW RELEASE IRON) 45 MG TBCR Take 1 tablet  by mouth daily.      . naproxen sodium (ANAPROX) 220 MG tablet Take 110 mg by mouth 2 (two) times daily at 10 AM and 5 PM.        . pantoprazole (PROTONIX) 40 MG tablet Take 40 mg by mouth daily.      . vitamin B-12 (CYANOCOBALAMIN) 100 MCG tablet Take 50 mcg by mouth daily.        . furosemide (LASIX) 20 MG tablet Take 1 tablet (20 mg total) by mouth daily.  30 tablet  6   No current facility-administered medications for this visit.    Past Medical History  Diagnosis Date  . Afib   . HTN (hypertension)   . Chronic diastolic heart failure   . Mitral regurgitation     Moderate, echo, 07/2012  . Pulmonary hypertension   . Anemia     No past surgical history on file.  History   Social History  . Marital Status: Widowed    Spouse Name: N/A    Number of Children: N/A  . Years of Education: N/A   Occupational History  . Not on file.   Social History Main Topics  . Smoking status: Never Smoker   . Smokeless tobacco: Never Used  . Alcohol Use: No  . Drug Use: No  . Sexually Active: Not on file   Other Topics Concern  . Not on file   Social History Narrative  . No narrative on file    No family history on file.  ROS: no nausea, vomiting; no fever, chills; no melena, hematochezia; no claudication  PHYSICAL EXAM: BP  142/88  Pulse 82  Ht 5\' 3"  (1.6 m)  Wt 121 lb 6.4 oz (55.067 kg)  BMI 21.51 kg/m2  SpO2 98% GENERAL: 79 year old female; NAD HEENT: NCAT, PERRLA, EOMI; sclera clear; no xanthelasma NECK: Positive JVD on R, at 90 LUNGS: CTA bilaterally CARDIAC: RRR (S1, S2); no significant murmurs; no rubs or gallops ABDOMEN: soft, non-tender; intact BS EXTREMETIES: 1+ bilateral, nonpitting peripheral edema SKIN: warm/dry; no obvious rash/lesions MUSCULOSKELETAL: no joint deformity NEURO: no focal deficit; NL affect   EKG:    ASSESSMENT & PLAN:  Atrial fibrillation I recommend discontinuing Pradaxa, as we had recently advised, and will defer to Dr. Graciela Husbands  regarding resumption of anticoagulation therapy. She is to remain on low-dose ASA in the interim. Dr. Diona Browner suggested substituting Pradaxa with Eliquis, at some point in the near future. Will continue current dose of diltiazem for rate control.  DIASTOLIC HEART FAILURE, CHRONIC Patient does present with evidence of volume overload by exam, but does not suggest symptoms of heart failure. Recommend that she start taking Lasix on a regular basis, starting at low dose 20 mg daily. Will check followup labs, including BNP level. Recent evaluation notable for NL LVF by echocardiography, and NL perfusion by nuclear imaging.  HTN (hypertension) Stable on current medication regimen  Mitral regurgitation Assessed as moderate, by recent echocardiogram.  Anemia No evidence of overt bleeding, by recent upper and lower endoscopy. Will check followup labs.    Jody [name], PAC

## 2012-08-17 NOTE — Assessment & Plan Note (Signed)
Stable on current medication regimen 

## 2012-08-17 NOTE — Patient Instructions (Signed)
   Stop Pradaxa  Continue Aspirin 81mg  daily Continue all other current medications. Begin Lasix 20mg  daily Labs today for BMET, BNP, CBC Office will contact with results Follow up in  2 weeks

## 2012-08-17 NOTE — Assessment & Plan Note (Signed)
Assessed as moderate, by recent echocardiogram.

## 2012-08-17 NOTE — Assessment & Plan Note (Addendum)
Patient does present with evidence of volume overload by exam, but does not suggest symptoms of heart failure. Recommend that she start taking Lasix on a regular basis, starting at low dose 20 mg daily. Will check followup labs, including BNP level. Recent evaluation notable for NL LVF by echocardiography, and NL perfusion by nuclear imaging.

## 2012-08-17 NOTE — Assessment & Plan Note (Signed)
I recommend discontinuing Pradaxa, as we had recently advised, and will defer to Dr. Graciela Husbands regarding resumption of anticoagulation therapy. She is to remain on low-dose ASA in the interim. Dr. Diona Browner suggested substituting Pradaxa with Eliquis, at some point in the near future. Will continue current dose of diltiazem for rate control.

## 2012-08-17 NOTE — Assessment & Plan Note (Signed)
No evidence of overt bleeding, by recent upper and lower endoscopy. Will check followup labs.

## 2012-08-29 ENCOUNTER — Encounter: Payer: Self-pay | Admitting: Internal Medicine

## 2012-08-29 ENCOUNTER — Ambulatory Visit (INDEPENDENT_AMBULATORY_CARE_PROVIDER_SITE_OTHER): Payer: Medicare Other | Admitting: Internal Medicine

## 2012-08-29 ENCOUNTER — Telehealth: Payer: Self-pay | Admitting: Internal Medicine

## 2012-08-29 VITALS — BP 112/70 | HR 68 | Ht 63.0 in | Wt 119.0 lb

## 2012-08-29 DIAGNOSIS — I4891 Unspecified atrial fibrillation: Secondary | ICD-10-CM

## 2012-08-29 DIAGNOSIS — I10 Essential (primary) hypertension: Secondary | ICD-10-CM

## 2012-08-29 LAB — BASIC METABOLIC PANEL
CO2: 30 mEq/L (ref 19–32)
Calcium: 9.2 mg/dL (ref 8.4–10.5)
Potassium: 4.9 mEq/L (ref 3.5–5.1)
Sodium: 137 mEq/L (ref 135–145)

## 2012-08-29 LAB — CBC WITH DIFFERENTIAL/PLATELET
Basophils Absolute: 0 10*3/uL (ref 0.0–0.1)
Basophils Relative: 0.6 % (ref 0.0–3.0)
HCT: 36.3 % (ref 36.0–46.0)
Hemoglobin: 12 g/dL (ref 12.0–15.0)
Lymphocytes Relative: 27.3 % (ref 12.0–46.0)
Lymphs Abs: 1.5 10*3/uL (ref 0.7–4.0)
MCHC: 33 g/dL (ref 30.0–36.0)
Monocytes Absolute: 0.5 10*3/uL (ref 0.1–1.0)
Monocytes Relative: 9.6 % (ref 3.0–12.0)
Neutro Abs: 3.2 10*3/uL (ref 1.4–7.7)
RBC: 3.92 Mil/uL (ref 3.87–5.11)

## 2012-08-29 MED ORDER — DILTIAZEM HCL ER BEADS 240 MG PO CP24: 240.0000 mg | ORAL_CAPSULE | Freq: Every day | ORAL | Status: DC

## 2012-08-29 MED ORDER — APIXABAN 2.5 MG PO TABS: 2.5000 mg | ORAL_TABLET | Freq: Two times a day (BID) | ORAL | Status: DC

## 2012-08-29 NOTE — Telephone Encounter (Signed)
Spoke with pt dtr, the eliquis is over $300 and she is unable to afford this med. They want to know what else they can use. Will discuss with dr Graciela Husbands

## 2012-08-29 NOTE — Telephone Encounter (Signed)
New Problem:    Patient's daughter called in because the patient was prescribed apixaban (ELIQUIS) 2.5 MG TABS tablet and it is not covered by the patient's insurance and wanted to know if he would be willing to switch her to a generic medication.  Please call back.

## 2012-08-29 NOTE — Assessment & Plan Note (Signed)
We will recheck her CBC following her recent GI bleed

## 2012-08-29 NOTE — Assessment & Plan Note (Signed)
Euvolemic. She is feeling better with the introduction of her diuretic. We'll check a metabolic profile today.

## 2012-08-29 NOTE — Patient Instructions (Addendum)
Your physician has recommended you make the following change in your medication: STOP Aspirin, START Eliquis 2.5mg  take one tablet by mouth twice a day, Please use Naprosyn as needed  Your physician recommends that you have lab work today: BMP and CBC  Your physician recommends that you schedule a follow-up appointment in: 8 WEEKS with Gene Serpe PA in the [place] office

## 2012-08-29 NOTE — Progress Notes (Signed)
Patient has no care team.   HPI  Jody [name] is a 79 y.o. female  is seen in followup for atrial fibrillation with a rapid ventricular response.   She was recently hospitalized for acute HFpEF in setting of GI bleed (HgB6.8) assoc with + troponin. This occurred in context of Pradaxa and asa    She underwent In 2014 she u  nuclear scanning demonstrating normal perfusion and normal left ventricular function. An echocardiogram 2014 also demonstrated normal left ventricular function withmoderate LVH MR and PHTN. Mild aortic stenosis was identified.    The patient denies chest pain, shortness of breath, nocturnal dyspnea, orthopnea or peripheral edema. There have been no palpitations, lightheadedness or syncope.  Denies stomach complaints   Hospital records reviewed   Past Medical History  Diagnosis Date  . Afib   . HTN (hypertension)   . Chronic diastolic heart failure   . Mitral regurgitation     Moderate, echo, 07/2012  . Pulmonary hypertension   . Anemia     No past surgical history on file.  Current Outpatient Prescriptions  Medication Sig Dispense Refill  . aspirin 81 MG chewable tablet Chew 81 mg by mouth daily.      Marland Kitchen diltiazem (CARDIZEM) 120 MG tablet Take 120 mg by mouth daily.      Marland Kitchen diltiazem (TIAZAC) 120 MG 24 hr capsule Take 120 mg by mouth daily.      . Ferrous Sulfate Dried (SLOW RELEASE IRON) 45 MG TBCR Take 1 tablet by mouth daily.      . furosemide (LASIX) 20 MG tablet Take 1 tablet (20 mg total) by mouth daily.  30 tablet  6  . naproxen sodium (ANAPROX) 220 MG tablet Take 110 mg by mouth 2 (two) times daily at 10 AM and 5 PM.        . vitamin B-12 (CYANOCOBALAMIN) 100 MCG tablet Take 50 mcg by mouth daily.         No current facility-administered medications for this visit.    No Known Allergies  Review of Systems negative except from HPI and PMH  Physical Exam BP 112/70  Pulse 68  Ht 5\' 3"  (1.6 m)  Wt 119 lb (53.978 kg)  BMI 21.09  kg/m2 Well developed and well nourished in no acute distress HENT normal E scleral and icterus clear Neck Supple JVP flat; carotids brisk and full Clear to ausculation  *Regular rate and rhythm, 2/6 sys murmur b Soft with active bowel sounds No clubbing cyanosis Trace Edema Alert and oriented, grossly normal motor and sensory function Skin Warm and Dry    Assessment and  Plan

## 2012-08-29 NOTE — Assessment & Plan Note (Signed)
Permanence. We will begin her on anticoagulation again. We will use of apixaban. I've asked her to stop her aspirin and use her Naprosyn only as needed. She is on a PPI.

## 2012-08-30 NOTE — Telephone Encounter (Signed)
Discussed with dr Graciela Husbands, both eliquis and xarelto are on the same tier. They both need prior auth. Savings card for eliquis mailed to pt home address, she will see if it works for her while I am working on the paperwork for the prior auth and poss tier exception.

## 2012-09-05 NOTE — Telephone Encounter (Signed)
dtr aware prior auth is complete and good for one year.

## 2012-09-05 NOTE — Telephone Encounter (Signed)
New Prob      Pts daughter states Dr. Graciela Husbands needs to call [phone] (insurance company-[employer]) to authorize pt needs ELIQUIS so insurance can pay for it.

## 2012-09-06 ENCOUNTER — Telehealth: Payer: Self-pay | Admitting: Internal Medicine

## 2012-09-06 NOTE — Telephone Encounter (Signed)
Left message to call back - unsure of what form needs to be faxed.

## 2012-09-06 NOTE — Telephone Encounter (Signed)
Follow Up      Following up on a form that was supposed to be faxed to [employer] regarding approval for ELIQUIS. Please call.  Fax Number: [phone]

## 2012-09-07 NOTE — Telephone Encounter (Signed)
Follow up  This is the fax number for her prescription ELIQUIS ... [facility]  [phone]

## 2012-09-10 NOTE — Telephone Encounter (Signed)
Follow up  Pt is calling back about the prescription for ELIQUIS.  She asked if someone could call her back about this medication. She said that [facility] has not received prior authorization yet for this medication.

## 2012-09-10 NOTE — Telephone Encounter (Signed)
lmom for patient that her rx is ready

## 2012-10-25 ENCOUNTER — Ambulatory Visit: Payer: Medicare Other | Admitting: Internal Medicine

## 2012-11-15 ENCOUNTER — Ambulatory Visit (INDEPENDENT_AMBULATORY_CARE_PROVIDER_SITE_OTHER): Payer: Medicare Other | Admitting: Internal Medicine

## 2012-11-15 ENCOUNTER — Encounter: Payer: Self-pay | Admitting: Internal Medicine

## 2012-11-15 VITALS — BP 132/78 | HR 69 | Ht 65.0 in | Wt 121.4 lb

## 2012-11-15 DIAGNOSIS — I4891 Unspecified atrial fibrillation: Secondary | ICD-10-CM

## 2012-11-15 MED ORDER — DILTIAZEM HCL ER BEADS 240 MG PO CP24: 240.0000 mg | ORAL_CAPSULE | Freq: Every day | ORAL | Status: DC

## 2012-11-15 NOTE — Patient Instructions (Addendum)
Your physician has recommended you make the following change in your medication:  1) Stop Tiazac (Diltiazem) 2) Start Taztia (Diltiazem) 240mg  by mouth daily 3. Start Eliquis 2.5mg  twice daily  Your physician wants you to follow-up in: 1 Year with Dr. Graciela Husbands. You will receive a reminder letter in the mail two months in advance. If you don't receive a letter, please call our office to schedule the follow-up appointment.

## 2012-11-15 NOTE — Progress Notes (Addendum)
.  kf Patient has no care team.   HPI  Jody [name] is a 79 y.o. female  is seen in followup for atrial fibrillation with a rapid ventricular response.  She was recently hospitalized for acute HFpEF in setting of GI bleed (HgB6.8) assoc with + troponin. This occurred in context of Pradaxa and asa  She underwent In 2014 she had a nuclear scanning demonstrating normal perfusion and normal left ventricular function. An echocardiogram 2014 also demonstrated normal left ventricular function withmoderate LVH MR and PHTN. Mild aortic stenosis was identified.    at her last visit she was started on apixaban and her aspirin was discontinued. Followup was anticipated in [place]  Intercurrently she has been hospitalized in the ICU for a viral infection complicated by heart failure.    Echocardiogram 4/14 demonstrated normal left ventricular systolic function moderate left atrial dilatation with evidence of diastolic dysfunction and mild aortic stenosis  She is currently treated with diltiazem for rate control. She is concerned that one of the generic form his diltiazem was associated with edema so she stopped taking her apixaban she thought was also causing edema.  At this point she is doing pretty well. She describes a little bit of leg weakness. She is back at work.  Past Medical History  Diagnosis Date  . Afib   . HTN (hypertension)   . Chronic diastolic heart failure   . Mitral regurgitation     Moderate, echo, 07/2012  . Pulmonary hypertension   . Anemia     No past surgical history on file.  Current Outpatient Prescriptions  Medication Sig Dispense Refill  . diltiazem (TIAZAC) 240 MG 24 hr capsule Take 1 capsule (240 mg total) by mouth daily.  30 capsule  6  . furosemide (LASIX) 20 MG tablet Take 1 tablet (20 mg total) by mouth daily.  30 tablet  6  . IRON, FERROUS GLUCONATE, PO Take 27 mg by mouth.      . vitamin B-12 (CYANOCOBALAMIN) 1000 MCG tablet Take 1,000 mcg by mouth daily.        No current facility-administered medications for this visit.    No Known Allergies  Review of Systems negative except from HPI and PMH  Physical Exam BP 132/78  Pulse 69  Ht 5\' 5"  (1.651 m)  Wt 121 lb 6.4 oz (55.067 kg)  BMI 20.2 kg/m2 Well developed and nourished in no acute distress HENT normal Neck supple with JVP-flat Carotids brisk and full without bruits Clear Irregularly irregular rate and rhythm with controlled ventricular response, 2/6 systolic murmur and a very for the RR intervalAbd-soft with active BS without hepatomegaly No Clubbing cyanosis trace edema Skin-warm and dry A & Oriented  Grossly normal sensory and motor function   ECG demonstrates atrial fibrillation at 69 Intervals-/15/43 Axis leftward at -71  Assessment and  Plan

## 2012-12-12 ENCOUNTER — Telehealth: Payer: Self-pay | Admitting: Internal Medicine

## 2012-12-12 NOTE — Telephone Encounter (Signed)
Spoke with patient's daughter who states patient is experiencing bilateral lower extremity swelling and weakness since starting Eliquis and Tiazac.  Patient also c/o SOB, denies chest discomfort (I can hear patient talking from the background).  Patient states she has to hold onto the wall to get to the bathroom; states these symptoms are new since began taking these medications. Patient would like to be seen by Dr. Graciela Husbands.  I advised patient that Dr. Graciela Husbands and Dory Horn, RN his primary nurse are in the office tomorrow and that I will send message to them for advice.  Patient and daughter verbalized understanding and agreement with plan of care.

## 2012-12-12 NOTE — Telephone Encounter (Signed)
New Problem  Pt is swelling in her legs// believes that it is due to medication.

## 2012-12-13 ENCOUNTER — Other Ambulatory Visit: Payer: Self-pay | Admitting: *Deleted

## 2012-12-13 ENCOUNTER — Telehealth: Payer: Self-pay | Admitting: Internal Medicine

## 2012-12-13 DIAGNOSIS — I4891 Unspecified atrial fibrillation: Secondary | ICD-10-CM

## 2012-12-13 MED ORDER — DILTIAZEM HCL ER COATED BEADS 120 MG PO CP24
120.0000 mg | ORAL_CAPSULE | Freq: Every day | ORAL | Status: DC
Start: 2012-12-13 — End: 2013-04-16

## 2012-12-13 NOTE — Telephone Encounter (Signed)
Already spoken with patient in encounter from yesterday

## 2012-12-13 NOTE — Telephone Encounter (Signed)
Per Dr. Graciela Husbands we will switch patient back to Cardizem 120mg  daily. Advised to elevate legs also to see if this helps edema. Asked her to keep [place] informed if status does not improve or symptoms worsen.  Patient agreeable to plan.

## 2012-12-13 NOTE — Telephone Encounter (Signed)
F/u message;  Called yesterday regarding edema in legs and shortness of breath.  Please call [phone] when checked with Dr. Graciela Husbands.  Daughter wants her to be seen.

## 2012-12-27 ENCOUNTER — Ambulatory Visit: Payer: Medicare Other | Admitting: Physician Assistant

## 2013-02-11 ENCOUNTER — Ambulatory Visit: Payer: Medicare Other | Attending: Orthopedic Surgery | Admitting: Physical Therapy

## 2013-02-11 DIAGNOSIS — M25669 Stiffness of unspecified knee, not elsewhere classified: Secondary | ICD-10-CM | POA: Insufficient documentation

## 2013-02-11 DIAGNOSIS — IMO0001 Reserved for inherently not codable concepts without codable children: Secondary | ICD-10-CM | POA: Insufficient documentation

## 2013-02-11 DIAGNOSIS — M25569 Pain in unspecified knee: Secondary | ICD-10-CM | POA: Insufficient documentation

## 2013-02-11 DIAGNOSIS — R269 Unspecified abnormalities of gait and mobility: Secondary | ICD-10-CM | POA: Insufficient documentation

## 2013-02-15 ENCOUNTER — Ambulatory Visit: Payer: Medicare Other | Admitting: *Deleted

## 2013-02-19 ENCOUNTER — Ambulatory Visit: Payer: Medicare Other | Admitting: Physical Therapy

## 2013-02-21 ENCOUNTER — Ambulatory Visit: Payer: Medicare Other | Admitting: *Deleted

## 2013-04-11 ENCOUNTER — Telehealth: Payer: Self-pay | Admitting: Internal Medicine

## 2013-04-11 NOTE — Telephone Encounter (Signed)
New Problem:  Pt's daughter is calling to get surgical clearance for her daughter to have knee replacement surgery with Dr. Remo Lipps Case... I scheduled pt to see Nicki Reaper weaver on 1/13 at 3:40.... Daughter wants to know if the pt needs th appt or if Dr. Caryl Comes can give the ok without her being seen.

## 2013-04-11 NOTE — Telephone Encounter (Signed)
Left message that I would discuss with Dr. Caryl Comes and get back with them on decision.

## 2013-04-12 NOTE — Telephone Encounter (Signed)
Follow Up:  Jody [name] is calling to follow up from the call yesterday.

## 2013-04-12 NOTE — Telephone Encounter (Signed)
Advised them to keep f/u with Richardson Dopp on 1/13 for cardiac clearance - per Dr. Caryl Comes. She verbalized understanding and agreeable to plan.

## 2013-04-16 ENCOUNTER — Encounter (INDEPENDENT_AMBULATORY_CARE_PROVIDER_SITE_OTHER): Payer: Self-pay

## 2013-04-16 ENCOUNTER — Encounter: Payer: Self-pay | Admitting: Physician Assistant

## 2013-04-16 ENCOUNTER — Ambulatory Visit (INDEPENDENT_AMBULATORY_CARE_PROVIDER_SITE_OTHER): Payer: Medicare Other | Admitting: Physician Assistant

## 2013-04-16 VITALS — BP 150/74 | HR 112 | Ht 64.0 in | Wt 115.0 lb

## 2013-04-16 DIAGNOSIS — I340 Nonrheumatic mitral (valve) insufficiency: Secondary | ICD-10-CM

## 2013-04-16 DIAGNOSIS — I10 Essential (primary) hypertension: Secondary | ICD-10-CM

## 2013-04-16 DIAGNOSIS — Z01810 Encounter for preprocedural cardiovascular examination: Secondary | ICD-10-CM

## 2013-04-16 DIAGNOSIS — I5030 Unspecified diastolic (congestive) heart failure: Secondary | ICD-10-CM

## 2013-04-16 DIAGNOSIS — I059 Rheumatic mitral valve disease, unspecified: Secondary | ICD-10-CM

## 2013-04-16 DIAGNOSIS — I4891 Unspecified atrial fibrillation: Secondary | ICD-10-CM

## 2013-04-16 DIAGNOSIS — I509 Heart failure, unspecified: Secondary | ICD-10-CM

## 2013-04-16 LAB — CBC WITH DIFFERENTIAL/PLATELET
BASOS PCT: 0.3 % (ref 0.0–3.0)
Basophils Absolute: 0 10*3/uL (ref 0.0–0.1)
EOS PCT: 1.7 % (ref 0.0–5.0)
Eosinophils Absolute: 0.1 10*3/uL (ref 0.0–0.7)
HEMATOCRIT: 44.8 % (ref 36.0–46.0)
Hemoglobin: 15.1 g/dL — ABNORMAL HIGH (ref 12.0–15.0)
LYMPHS ABS: 1.4 10*3/uL (ref 0.7–4.0)
Lymphocytes Relative: 20.4 % (ref 12.0–46.0)
MCHC: 33.6 g/dL (ref 30.0–36.0)
MCV: 95.2 fl (ref 78.0–100.0)
MONOS PCT: 8.7 % (ref 3.0–12.0)
Monocytes Absolute: 0.6 10*3/uL (ref 0.1–1.0)
NEUTROS ABS: 4.6 10*3/uL (ref 1.4–7.7)
Neutrophils Relative %: 68.9 % (ref 43.0–77.0)
Platelets: 259 10*3/uL (ref 150.0–400.0)
RBC: 4.7 Mil/uL (ref 3.87–5.11)
RDW: 15.1 % — ABNORMAL HIGH (ref 11.5–14.6)
WBC: 6.7 10*3/uL (ref 4.5–10.5)

## 2013-04-16 LAB — BASIC METABOLIC PANEL
BUN: 26 mg/dL — AB (ref 6–23)
CALCIUM: 9.1 mg/dL (ref 8.4–10.5)
CHLORIDE: 100 meq/L (ref 96–112)
CO2: 30 meq/L (ref 19–32)
CREATININE: 1.4 mg/dL — AB (ref 0.4–1.2)
GFR: 39.04 mL/min — ABNORMAL LOW (ref >60.00)
GLUCOSE: 96 mg/dL (ref 70–99)
Potassium: 4.3 mEq/L (ref 3.5–5.1)
Sodium: 139 mEq/L (ref 135–145)

## 2013-04-16 MED ORDER — DILTIAZEM HCL ER COATED BEADS 240 MG PO CP24: 240.0000 mg | ORAL_CAPSULE | Freq: Every day | ORAL | Status: DC

## 2013-04-16 NOTE — Patient Instructions (Addendum)
INCREASE DILTIAZEM TO 240 MG DAILY; NEW RX WAS SENT IN TODAY [facility] IN [place]  LAB WORK TODAY; BMET, CBC W/DIFF  YOU NEED TO HAVE AN EKG DONE IN 1 WEEK IN THE [place] OFFICE AND HAVE THE RESULTS FAXED TO SCOTT WEAVER, PAC [phone]  WE WILL SEND OUT A REMINDER LETTER TO MAKE AN APPT TO SEE DR. KLEIN IN 07/2013

## 2013-04-16 NOTE — Progress Notes (Addendum)
[place], [place],   [address] Phone: [phone] Fax:  [phone]  Date:  04/16/2013   ID:  Jody [name], DOB November 01, 1927, MRN [id number]  PCP:  No PCP Per Patient  Cardiologist:  Dr. Virl Axe    History of Present Illness: Jody [name] is a 80 y.o. female with a hx of permanent AFib, HFpEF assoc with RVR, HTN, mitral regurgitation.  She was admitted to [facility] in 07/2012 with profound anemia 2/2 GI bleed while on Pradaxa requiring transfusion with PRBCs complicated by Type 2 NSTEMI and acute HFpEF.  Echo (07/24/2012):  EF 60%, mod LVH, inferobasal/inferior HK, Gr 2 DD, mod MR, mild AS, mod TR, mod pulmo HTN.  LexiScan CLite (07/24/2012):  No evidence of scar or ischemia, EF 60%.  EGD demonstrated gastritis and Schatzki ring; no overt bleeding.  She was kept off of Pradaxa.  She was placed on Apixaban as an outpatient.  Last seen by Dr. Virl Axe in 11/2012.    She needs R TKR for DJD with Dr. Remo Lipps Case.  Surgery will be at [facility].  Date TBD.  He is here with her daughter.  She currently walks with a walker.  She denies chest pain, significant dyspnea, orthopnea, PND, edema.  She denies sudden weight gain.  No syncope, near syncope.  No palpitations.     Recent Labs: 08/29/2012: Creatinine 1.0; Hemoglobin 12.0; Potassium 4.9   Wt Readings from Last 3 Encounters:  04/16/13 115 lb (52.164 kg)  11/15/12 121 lb 6.4 oz (55.067 kg)  08/29/12 119 lb (53.978 kg)     Past Medical History  Diagnosis Date  . Afib   . HTN (hypertension)   . Chronic diastolic heart failure   . Mitral regurgitation     Moderate, echo, 07/2012  . Pulmonary hypertension   . Anemia     Current Outpatient Prescriptions  Medication Sig Dispense Refill  . apixaban (ELIQUIS) 2.5 MG TABS tablet Take 1 tablet (2.5 mg total) by mouth 2 (two) times daily.      Marland Kitchen diltiazem (CARDIZEM CD) 120 MG 24 hr capsule Take 1 capsule (120 mg total) by mouth daily.  90 capsule  3  .  furosemide (LASIX) 20 MG tablet Take 1 tablet (20 mg total) by mouth daily.  30 tablet  6  . IRON, FERROUS GLUCONATE, PO Take 27 mg by mouth.      . vitamin B-12 (CYANOCOBALAMIN) 1000 MCG tablet Take 1,000 mcg by mouth daily.       No current facility-administered medications for this visit.    Allergies:   Review of patient's allergies indicates no known allergies.   Social History:  The patient  reports that she has never smoked. She has never used smokeless tobacco. She reports that she does not drink alcohol or use illicit drugs.   Family History:  The patient's family history is not on file.   ROS:  Please see the history of present illness.   She denies melena, hematochezia, hematuria.    All other systems reviewed and negative.   PHYSICAL EXAM: VS:  BP 150/74  Pulse 112  Ht 5\' 4"  (1.626 m)  Wt 115 lb (52.164 kg)  BMI 19.73 kg/m2 Well nourished, well developed, in no acute distress HEENT: normal Neck: no JVDat 90 degrees Cardiac:  normal S1, S2; RRR; 6-8/3 systolic murmurLUSB Lungs:  clear to auscultation bilaterally, no wheezing, rhonchi or rales Abd: soft, nontender, no hepatomegaly Ext: no edema Skin:  warm and dry Neuro:  CNs 2-12 intact, no focal abnormalities noted  EKG:  Atrial fibrillation, HR 112, LBBB     ASSESSMENT AND PLAN:  1. Atrial Fibrillation:  Rate is uncontrolled.  She is now on Dilt 120 QD instead of 240 QD for unclear reasons.  I will increase her Diltiazem CD back to 240 mg QD.  I will have her return in 1 week to recheck an ECG to ensure her HR is well controlled.  She is tolerating Eliquis and she will continue this.  Check f/u CBC and BMET today. 2. Heart Failure with Preserved EF (HFpEF):  Volume is stable.  Continue current Rx.  Check f/u BMET today.   3. Surgical Clearance:  She does not require further testing.  As noted, she needs a follow up ECG next week to check her HR.  She is at acceptable risk.  She can hold her Eliquis 2 days prior to  surgery.  She will need to resume this post op when cleared by surgery.  Cardiology should be consulted perioperatively should any complications arise.   4. Mitral Regurgitation:  She is asymptomatic.   5. Hypertension:  BP elevated.  Increase Diltiazem as noted.  6. Pulmonary HTN:  She is asymptomatic.  7. Disposition:  F/u with Dr. Virl Axe as planned. She will have an ECG next week in our [place] clinic.   Signed, Richardson Dopp, PA-C  04/16/2013 3:07 PM    Addendum: The patient does not have any unstable cardiac conditions.  Follow up ECG demonstrates controlled ventricular rate in atrial fibrillation.  She does not require any further cardiac workup prior to her non-cardiac surgery.  She may proceed with her surgery at acceptable risk. Signed, Richardson Dopp, PA-C   05/20/2013 11:39 AM

## 2013-04-18 ENCOUNTER — Telehealth: Payer: Self-pay | Admitting: *Deleted

## 2013-04-18 DIAGNOSIS — I4891 Unspecified atrial fibrillation: Secondary | ICD-10-CM

## 2013-04-18 DIAGNOSIS — I10 Essential (primary) hypertension: Secondary | ICD-10-CM

## 2013-04-18 DIAGNOSIS — I5032 Chronic diastolic (congestive) heart failure: Secondary | ICD-10-CM

## 2013-04-18 NOTE — Telephone Encounter (Signed)
s/w pt's daughter Jody [name] who is pt's caregiver about lab results and instructions to hold lasix x 3 days then resume Mon, Wed, and Fri's only. Instructed to weigh daily and call if wt > 3 lb's . BMET 04/26/13 in our office.Cheryl verbalized Plan of Care.

## 2013-04-23 ENCOUNTER — Telehealth: Payer: Self-pay | Admitting: Physician Assistant

## 2013-04-23 NOTE — Telephone Encounter (Signed)
Pt's daughter Malachy Mood advised of EKG appt in the [place] office and lab work to be done at [facility]. Malachy Mood verbalized understanding to Plan of Care. Lab order faxed to [place]

## 2013-04-23 NOTE — Telephone Encounter (Signed)
New message     Pt is going to the [place] office in [place] tomorrow to have an ekg----she is scheduled to have lab drawn here fri---will you fax the order for lab to the [place] office so that she can have ekg and bld wk at [place] tomorrow?

## 2013-04-24 ENCOUNTER — Ambulatory Visit (INDEPENDENT_AMBULATORY_CARE_PROVIDER_SITE_OTHER): Payer: Medicare Other | Admitting: *Deleted

## 2013-04-24 ENCOUNTER — Encounter: Payer: Self-pay | Admitting: Physician Assistant

## 2013-04-24 ENCOUNTER — Telehealth: Payer: Self-pay | Admitting: *Deleted

## 2013-04-24 DIAGNOSIS — I4891 Unspecified atrial fibrillation: Secondary | ICD-10-CM

## 2013-04-24 NOTE — Telephone Encounter (Signed)
pt's daughter Malachy Mood cb and has been advised ok for surgery and I will fax ov from PA to surgeon [facility] @ [facility] fax # [phone]. I will also fax over pt's lab results once they have been faxed to [place] from [facility] and read by dr. . Malachy Mood verbalized to Plan of Care.

## 2013-04-24 NOTE — Telephone Encounter (Signed)
lmptcb ekg hr improved, no changes with medication regimen, and ok to proceed with surgery. I did ask for cb to comfirm recv'd this message.

## 2013-05-05 HISTORY — PX: JOINT REPLACEMENT: SHX530

## 2013-05-06 ENCOUNTER — Telehealth: Payer: Self-pay | Admitting: *Deleted

## 2013-05-06 NOTE — Telephone Encounter (Signed)
daughter Malachy Mood notified about lab results that were done at the PCP, no changes to be made. Creatinine improved. Malachy Mood verbalized understanding to Plan of Care

## 2013-05-14 ENCOUNTER — Telehealth: Payer: Self-pay | Admitting: Internal Medicine

## 2013-05-14 NOTE — Telephone Encounter (Signed)
New Message  Jody [name] with [facility] called. She states that the note that was previously sent is not clear// It says that the pt is at acceptable risk but it doesn't say that she was clear. Does her EKG indicate that she is clear. Please call back to assist

## 2013-05-17 NOTE — Telephone Encounter (Signed)
Re-faxed over Richardson Dopp, [place] office note with clarification about being cleared for surgery.

## 2013-07-04 ENCOUNTER — Ambulatory Visit (INDEPENDENT_AMBULATORY_CARE_PROVIDER_SITE_OTHER): Payer: Medicare Other | Admitting: Internal Medicine

## 2013-07-04 ENCOUNTER — Ambulatory Visit (HOSPITAL_COMMUNITY): Payer: Medicare Other | Attending: Internal Medicine | Admitting: Radiology

## 2013-07-04 ENCOUNTER — Encounter: Payer: Self-pay | Admitting: Internal Medicine

## 2013-07-04 ENCOUNTER — Ambulatory Visit: Payer: Medicare Other | Admitting: Internal Medicine

## 2013-07-04 ENCOUNTER — Other Ambulatory Visit (HOSPITAL_COMMUNITY): Payer: Self-pay | Admitting: Radiology

## 2013-07-04 VITALS — BP 141/79 | HR 89 | Ht 62.0 in | Wt 114.0 lb

## 2013-07-04 DIAGNOSIS — I079 Rheumatic tricuspid valve disease, unspecified: Secondary | ICD-10-CM | POA: Insufficient documentation

## 2013-07-04 DIAGNOSIS — I4891 Unspecified atrial fibrillation: Secondary | ICD-10-CM

## 2013-07-04 DIAGNOSIS — I059 Rheumatic mitral valve disease, unspecified: Secondary | ICD-10-CM | POA: Insufficient documentation

## 2013-07-04 DIAGNOSIS — I379 Nonrheumatic pulmonary valve disorder, unspecified: Secondary | ICD-10-CM | POA: Insufficient documentation

## 2013-07-04 DIAGNOSIS — I10 Essential (primary) hypertension: Secondary | ICD-10-CM | POA: Insufficient documentation

## 2013-07-04 MED ORDER — APIXABAN 2.5 MG PO TABS: 2.5000 mg | ORAL_TABLET | Freq: Two times a day (BID) | ORAL | Status: DC

## 2013-07-04 MED ORDER — CEPHALEXIN 250 MG PO CAPS: 250.0000 mg | ORAL_CAPSULE | Freq: Two times a day (BID) | ORAL | Status: DC

## 2013-07-04 NOTE — Progress Notes (Signed)
Echocardiogram performed.  

## 2013-07-04 NOTE — Patient Instructions (Addendum)
Your physician has recommended you make the following change in your medication:  1) Start Keflex 250 mg twice daily for 10 days  Your physician wants you to follow-up in: 6 months with Dr. Caryl Comes.  You will receive a reminder letter in the mail two months in advance. If you don't receive a letter, please call our office to schedule the follow-up appointment.

## 2013-07-04 NOTE — Progress Notes (Signed)
   PCP:  No PCP Per Patient  The patient presents today for routine electrophysiology followup.  She has recently undergone right TKR and is recovering slowly.  She is asymptomatic with her atrial fibrillation and is unaware of her rates.   Ehocardiogram done today and preliminary report demonstrates normal EF, severe TR, moderate MR, bilateral atrial dilation, mild AS, elevated pulmonary pressures.   She has multiple concerns regarding A1C, uric acid levels, and skin changes - she has been encouraged to establish with a primary care physician.   Today, she denies symptoms of palpitations, chest pain, shortness of breath, orthopnea, PND, lower extremity edema, dizziness, presyncope, syncope, or neurologic sequela.  The patient feels that she is tolerating medications without difficulties and is otherwise without complaint today.   Past Medical History  Diagnosis Date  . Afib   . HTN (hypertension)   . Chronic diastolic heart failure   . Mitral regurgitation     Moderate, echo, 07/2012  . Pulmonary hypertension   . Anemia    No past surgical history on file.  Current Outpatient Prescriptions  Medication Sig Dispense Refill  . apixaban (ELIQUIS) 2.5 MG TABS tablet Take 1 tablet (2.5 mg total) by mouth 2 (two) times daily.      Marland Kitchen diltiazem (CARDIZEM CD) 240 MG 24 hr capsule Take 1 capsule (240 mg total) by mouth daily.  30 capsule  11  . furosemide (LASIX) 20 MG tablet Take 1 tablet (20 mg total) by mouth daily.  30 tablet  6  . IRON, FERROUS GLUCONATE, PO Take 27 mg by mouth.      . vitamin B-12 (CYANOCOBALAMIN) 1000 MCG tablet Take 1,000 mcg by mouth daily.       No current facility-administered medications for this visit.    No Known Allergies  History   Social History  . Marital Status: Widowed    Spouse Name: N/A    Number of Children: N/A  . Years of Education: N/A   Occupational History  . Not on file.   Social History Main Topics  . Smoking status: Never Smoker   .  Smokeless tobacco: Never Used  . Alcohol Use: No  . Drug Use: No  . Sexual Activity: Not on file   Other Topics Concern  . Not on file   Social History Narrative  . No narrative on file    No family history on file.  ROS-  All systems are reviewed and are negative except as outlined in the HPI above  Physical Exam: Filed Vitals:   07/04/13 1528  BP: 141/79  Pulse: 89  Height: 5\' 2"  (1.575 m)  Weight: 114 lb (51.71 kg)    GEN- The patient is well appearing, alert and oriented x 3 today.   Head- normocephalic, atraumatic Eyes-  Sclera clear, conjunctiva pink Ears- hearing intact Oropharynx- clear Neck- supple, no JVP Lymph- no cervical lymphadenopathy Lungs- Clear to ausculation bilaterally, normal work of breathing Heart- Regular rate and rhythm, no murmurs, rubs or gallops, PMI not laterally displaced GI- soft, NT, ND, + BS Extremities- no clubbing, cyanosis, or edema MS- no significant deformity or atrophy Skin- no rash or lesion Psych- euthymic mood, full affect Neuro- strength and sensation are intact  Assessment and Plan:  Atrial fibrillation  HFpEF  Pt doing amazingly well will continue durrent meds

## 2013-07-09 ENCOUNTER — Telehealth: Payer: Self-pay | Admitting: Internal Medicine

## 2013-07-09 NOTE — Telephone Encounter (Signed)
No answer

## 2013-07-09 NOTE — Telephone Encounter (Signed)
New Message:  Pt's daughter, Malachy Mood, is requesting a call back for clarification on her moms "leaky valve" in her heart.

## 2013-07-09 NOTE — Telephone Encounter (Signed)
Ms. Wagenaar daughter Malachy Mood wanted to know if Ms. Griffing needed another week of antibiotics since she is not able to see her new PCP until 4/20. She states Ms Ackroyd legs are just now starting to go from red. She is not sure if her mother is just now getting the antibiotics "good in her system" but does not want the red to come back now that it is fading away.  She wants a call back from Dr Olin Pia nurse if possible to whether or not her is to go back on antibiotics. Ms Bruck is not finished with her antibiotics and I let her know in the original office note it states 10 days of Keflex twice a day but will forward this message to Dr Olin Pia nurse.     [place] [phone]  Cell [phone]

## 2013-07-10 ENCOUNTER — Telehealth: Payer: Self-pay | Admitting: Internal Medicine

## 2013-07-10 NOTE — Telephone Encounter (Signed)
New message    Daughter want to know which valve was leaking?

## 2013-07-10 NOTE — Telephone Encounter (Signed)
Advised to go to urgent care, if necessary, if legs do not improve before PCP establishment on 4/20 (per Dr. Caryl Comes). Pt's dtr agreeable to this plan.

## 2013-07-10 NOTE — Telephone Encounter (Signed)
Informed pt's dtr that tricuspid valve leaking, and per Caryl Comes, we are not doing anything about this. She verbalized understanding.

## 2013-07-22 ENCOUNTER — Encounter: Payer: Self-pay | Admitting: Family Medicine

## 2013-07-22 ENCOUNTER — Ambulatory Visit (INDEPENDENT_AMBULATORY_CARE_PROVIDER_SITE_OTHER): Payer: Medicare Other | Admitting: Family Medicine

## 2013-07-22 VITALS — BP 112/62 | HR 82 | Temp 98.1°F | Ht 62.0 in | Wt 114.0 lb

## 2013-07-22 DIAGNOSIS — R531 Weakness: Secondary | ICD-10-CM

## 2013-07-22 DIAGNOSIS — R5383 Other fatigue: Secondary | ICD-10-CM

## 2013-07-22 DIAGNOSIS — R7989 Other specified abnormal findings of blood chemistry: Secondary | ICD-10-CM

## 2013-07-22 DIAGNOSIS — R5381 Other malaise: Secondary | ICD-10-CM

## 2013-07-22 DIAGNOSIS — L0390 Cellulitis, unspecified: Secondary | ICD-10-CM

## 2013-07-22 DIAGNOSIS — E790 Hyperuricemia without signs of inflammatory arthritis and tophaceous disease: Secondary | ICD-10-CM

## 2013-07-22 DIAGNOSIS — L0291 Cutaneous abscess, unspecified: Secondary | ICD-10-CM

## 2013-07-22 LAB — POCT CBC
Granulocyte percent: 75.3 %G (ref 37–80)
HEMATOCRIT: 37.6 % — AB (ref 37.7–47.9)
HEMOGLOBIN: 12.2 g/dL (ref 12.2–16.2)
Lymph, poc: 1.4 (ref 0.6–3.4)
MCH: 30.3 pg (ref 27–31.2)
MCHC: 32.4 g/dL (ref 31.8–35.4)
MCV: 93.6 fL (ref 80–97)
MPV: 6.8 fL (ref 0–99.8)
POC Granulocyte: 5.1 (ref 2–6.9)
POC LYMPH %: 20.4 % (ref 10–50)
Platelet Count, POC: 362 10*3/uL (ref 142–424)
RBC: 4 M/uL — AB (ref 4.04–5.48)
RDW, POC: 15 %
WBC: 6.8 10*3/uL (ref 4.6–10.2)

## 2013-07-22 MED ORDER — DOXYCYCLINE HYCLATE 100 MG PO CAPS: 100.0000 mg | ORAL_CAPSULE | Freq: Two times a day (BID) | ORAL | Status: DC

## 2013-07-22 NOTE — Progress Notes (Signed)
Patient name: Jody [name] Medical record number: [id number] Date of birth: 1927-10-25 Age: 80 y.o. Gender: female  Primary Care Provider: Redge Gainer, MD  Chief Complaint: cellulitis follow up   History of Present Illness:This is a 80 y.o. year old female with noted prior hx/o chronic diastolic heart failure, MR, HTN, afib on eliquis-followed by Dr. Caryl Comes presenting as a new pt for follow up of bilateral LE cellulitis. Apart from fairly stable cardiac issues, pt has not had any significant medical issues. Has been getting the majority of her care through her cardiologist. Was seen by Dr. Caryl Comes on 4/2. Pt with reported bilateral cellulitis. Pt was started on keflex for soft tissue coverage x 10 days. Pt was told to follow up with PCP for recheck/establish care. Pt states " I dont like going to the doctor!" Pt/daughter report that sxs are much improved in comparison to initial visit on 4/2, though there is still some persistent redness in LLE. No fevers or chills.  Had R TKR 05/2013. Denies any pain, swelling, redness in R knee.  Does report some mild intermittent weakness. States that she discussed this with Dr. Caryl Comes. Believes it is related to MR.  Denies any LE swelling, orthopnea, PND. No chest pain.  Is refusing immunizations.    Past Medical History: Patient Active Problem List   Diagnosis Date Noted  . (HFpEF) heart failure with preserved ejection fraction 04/16/2013  . Mitral regurgitation   . Anemia   . HTN (hypertension)   . Atrial fibrillation 04/02/2009  . DIASTOLIC HEART FAILURE, CHRONIC 04/02/2009   Past Medical History  Diagnosis Date  . Afib   . HTN (hypertension)   . Chronic diastolic heart failure   . Mitral regurgitation     Moderate, echo, 07/2012  . Pulmonary hypertension   . Anemia     Past Surgical History: Past Surgical History  Procedure Laterality Date  . Joint replacement Right Feb 2015    Dr. Case- [facility] Noss    Social History: History    Social History  . Marital Status: Widowed    Spouse Name: N/A    Number of Children: N/A  . Years of Education: N/A   Social History Main Topics  . Smoking status: Never Smoker   . Smokeless tobacco: Never Used  . Alcohol Use: No  . Drug Use: No  . Sexual Activity: None   Other Topics Concern  . None   Social History Narrative  . None    Family History: Family History  Problem Relation Age of Onset  . Diabetes Mother     Allergies: No Known Allergies  Current Outpatient Prescriptions  Medication Sig Dispense Refill  . apixaban (ELIQUIS) 2.5 MG TABS tablet Take 1 tablet (2.5 mg total) by mouth 2 (two) times daily.  60 tablet  5  . diltiazem (CARDIZEM CD) 240 MG 24 hr capsule Take 1 capsule (240 mg total) by mouth daily.  30 capsule  11  . furosemide (LASIX) 20 MG tablet Take 1 tablet (20 mg total) by mouth daily.  30 tablet  6  . IRON, FERROUS GLUCONATE, PO Take 27 mg by mouth.      . vitamin B-12 (CYANOCOBALAMIN) 1000 MCG tablet Take 1,000 mcg by mouth daily.       No current facility-administered medications for this visit.   Review Of Systems: 12 point ROS negative except as noted above in HPI.  Physical Exam: Filed Vitals:   07/22/13 0926  BP: 112/62  Pulse: 82  Temp: 98.1 F (36.7 C)    General: alert and cooperative HEENT: PERRLA and extra ocular movement intact Heart: S1, S2 normal, no murmur, rub or gallop, regular rate and rhythm Lungs: clear to auscultation, no wheezes or rales and unlabored breathing Abdomen: abdomen is soft without significant tenderness, masses, organomegaly or guarding Extremities: R knee TKR scarring stable, mild LLE erythema, redness  Skin:as above, otherwise WNL  Neurology: normal without focal findings  Labs and Imaging:  Pending   Assessment and Plan: Weakness - Plan: TSH, Vitamin D pnl(25-hydrxy+1,25-dihy)-bld, Vitamin B12, Comprehensive metabolic panel, POCT CBC, Prealbumin  Hyperuricemia - Plan: Uric  acid  Cellulitis - Plan: doxycycline (VIBRAMYCIN) 100 MG capsule    Will place on extended course of doxy.  Plan for recheck in 4-5 days. Will check general labs including TSH, CMET, vitamin D for weakness, though likely may be contributions of MR and HF.  Discussed red flags.  Follow up as needed.       Shanda Howells MD

## 2013-07-23 LAB — TSH: TSH: 2.37 u[IU]/mL (ref 0.450–4.500)

## 2013-07-23 LAB — COMPREHENSIVE METABOLIC PANEL
ALT: 8 IU/L (ref 0–32)
AST: 16 IU/L (ref 0–40)
Albumin/Globulin Ratio: 1.6 (ref 1.1–2.5)
Albumin: 4.1 g/dL (ref 3.5–4.7)
Alkaline Phosphatase: 119 IU/L — ABNORMAL HIGH (ref 39–117)
BILIRUBIN TOTAL: 0.3 mg/dL (ref 0.0–1.2)
BUN/Creatinine Ratio: 19 (ref 11–26)
BUN: 19 mg/dL (ref 8–27)
CALCIUM: 9.3 mg/dL (ref 8.7–10.3)
CO2: 24 mmol/L (ref 18–29)
Chloride: 99 mmol/L (ref 97–108)
Creatinine, Ser: 1 mg/dL (ref 0.57–1.00)
GFR calc Af Amer: 60 mL/min/{1.73_m2} (ref >59)
GFR calc non Af Amer: 52 mL/min/{1.73_m2} — ABNORMAL LOW (ref >59)
GLUCOSE: 92 mg/dL (ref 65–99)
Globulin, Total: 2.6 g/dL (ref 1.5–4.5)
POTASSIUM: 4.7 mmol/L (ref 3.5–5.2)
Sodium: 139 mmol/L (ref 134–144)
TOTAL PROTEIN: 6.7 g/dL (ref 6.0–8.5)

## 2013-07-23 LAB — VITAMIN B12: Vitamin B-12: 638 pg/mL (ref 211–946)

## 2013-07-23 LAB — URIC ACID: Uric Acid: 6.5 mg/dL (ref 2.5–7.1)

## 2013-07-23 LAB — PREALBUMIN: Prealbumin: 20 mg/dL (ref 20–40)

## 2013-07-26 ENCOUNTER — Ambulatory Visit (INDEPENDENT_AMBULATORY_CARE_PROVIDER_SITE_OTHER): Payer: Medicare Other | Admitting: Family Medicine

## 2013-07-26 ENCOUNTER — Encounter: Payer: Self-pay | Admitting: Family Medicine

## 2013-07-26 VITALS — BP 122/73 | HR 82 | Temp 96.6°F | Ht 62.0 in | Wt 115.4 lb

## 2013-07-26 DIAGNOSIS — L0390 Cellulitis, unspecified: Secondary | ICD-10-CM

## 2013-07-26 DIAGNOSIS — L0291 Cutaneous abscess, unspecified: Secondary | ICD-10-CM

## 2013-07-26 DIAGNOSIS — R609 Edema, unspecified: Secondary | ICD-10-CM

## 2013-07-26 DIAGNOSIS — R600 Localized edema: Secondary | ICD-10-CM

## 2013-07-26 MED ORDER — DOXYCYCLINE HYCLATE 100 MG PO CAPS: 100.0000 mg | ORAL_CAPSULE | Freq: Two times a day (BID) | ORAL | Status: DC

## 2013-07-26 MED ORDER — NYSTATIN-TRIAMCINOLONE 100000-0.1 UNIT/GM-% EX OINT: 1.0000 "application " | TOPICAL_OINTMENT | Freq: Two times a day (BID) | CUTANEOUS | Status: DC

## 2013-07-26 NOTE — Progress Notes (Signed)
   Subjective:    Patient ID: Jody [name], female    DOB: 12-Feb-1928, 80 y.o.   MRN: [id number]  HPI This 80 y.o. female presents for evaluation of bilateral lower extremity cellulitis.  She was tx with doxycycline and she has been doing better and her legs are feeling better and are not as red. She denies fevers or pain in her legs at this time.  She has had some labs drawn by Dr. Ernestina Patches and we will review today.   Review of Systems C/o bilateral lower extremity cellulitis No chest pain, SOB, HA, dizziness, vision change, N/V, diarrhea, constipation, dysuria, urinary urgency or frequency, myalgias, arthralgias or rash.     Objective:   Physical Exam  Vital signs noted  Well developed well nourished elderly female in NAD.  HEENT - Head atraumatic Normocephalic                Eyes - PERRLA, Conjuctiva - clear Sclera- Clear EOMI Respiratory - Lungs CTA bilateral Cardiac - RRR S1 and S2 without murmur GI - Abdomen soft Nontender and bowel sounds active x 4 Extremities - Bilateral lower extremities with venous stasis and no edema and mild erythema to  Mid pretibial region and not warm or painful and no ulcers.       Assessment & Plan:  Cellulitis - Plan: doxycycline (VIBRAMYCIN) 100 MG capsule  Take another 7 days. Reviewed labs which look good and show normal wbc and no diabetes, thyroid, or metabolic concerns.  Discussed that she needs to follow up if the infection starts to come back but at this Time her legs are healing up well.  Edema of both legs - Plan: nystatin-triamcinolone ointment (MYCOLOG) apply bid prn Erythema   Lysbeth Penner FNP

## 2014-04-10 DIAGNOSIS — M1712 Unilateral primary osteoarthritis, left knee: Secondary | ICD-10-CM | POA: Diagnosis not present

## 2014-04-16 DIAGNOSIS — M1712 Unilateral primary osteoarthritis, left knee: Secondary | ICD-10-CM | POA: Diagnosis not present

## 2014-04-16 DIAGNOSIS — M23307 Other meniscus derangements, unspecified meniscus, left knee: Secondary | ICD-10-CM | POA: Diagnosis not present

## 2014-04-16 DIAGNOSIS — M94262 Chondromalacia, left knee: Secondary | ICD-10-CM | POA: Diagnosis not present

## 2014-04-19 ENCOUNTER — Other Ambulatory Visit: Payer: Self-pay | Admitting: Physician Assistant

## 2014-05-05 ENCOUNTER — Ambulatory Visit (INDEPENDENT_AMBULATORY_CARE_PROVIDER_SITE_OTHER): Payer: Medicare Other | Admitting: Internal Medicine

## 2014-05-05 ENCOUNTER — Encounter: Payer: Self-pay | Admitting: Internal Medicine

## 2014-05-05 VITALS — BP 152/82 | HR 79 | Ht 62.0 in | Wt 115.8 lb

## 2014-05-05 DIAGNOSIS — I480 Paroxysmal atrial fibrillation: Secondary | ICD-10-CM

## 2014-05-05 MED ORDER — APIXABAN 2.5 MG PO TABS: 2.5000 mg | ORAL_TABLET | Freq: Two times a day (BID) | ORAL | Status: DC

## 2014-05-05 MED ORDER — DILTIAZEM HCL ER COATED BEADS 240 MG PO CP24: 240.0000 mg | ORAL_CAPSULE | Freq: Every day | ORAL | Status: DC

## 2014-05-05 MED ORDER — FUROSEMIDE 20 MG PO TABS: 20.0000 mg | ORAL_TABLET | Freq: Every day | ORAL | Status: DC | PRN

## 2014-05-05 NOTE — Progress Notes (Signed)
.  kf Patient Care Team: Chipper Herb, MD as PCP - General (Family Medicine)   HPI  [facility] Jody [name] is a 81 y.o. female  is seen in followup for atrial fibrillation with a prior problem with rapid ventricular response.    She underwent In 2014 she had a nuclear scanning demonstrating normal perfusion and normal left ventricular function. An echocardiogram 2014 also demonstrated normal left ventricular function withmoderate LVH MR and PHTN. Mild aortic stenosis was identified.   At her last visit she was started on apixaban and her aspirin was discontinued.    Echocardiogram 4/14 demonstrated normal left ventricular systolic function moderate left atrial dilatation with evidence of diastolic dysfunction and mild aortic stenosis  She is currently treated with diltiazem for rate control.  She takes her meds regularly x diuretic which she takes as needed  The patient denies chest pain, shortness of breath, nocturnal dyspnea, orthopnea or peripheral edema.  There have been no palpitations, lightheadedness or syncope.    Past Medical History  Diagnosis Date  . Afib   . HTN (hypertension)   . Chronic diastolic heart failure   . Mitral regurgitation     Moderate, echo, 07/2012  . Pulmonary hypertension   . Anemia     Past Surgical History  Procedure Laterality Date  . Joint replacement Right Feb 2015    Dr. CaseLedell Noss    Current Outpatient Prescriptions  Medication Sig Dispense Refill  . apixaban (ELIQUIS) 2.5 MG TABS tablet Take 1 tablet (2.5 mg total) by mouth 2 (two) times daily. 60 tablet 5  . CARTIA XT 240 MG 24 hr capsule TAKE ONE CAPSULE BY MOUTH ONCE DAILY 30 capsule 0  . furosemide (LASIX) 20 MG tablet Take 1 tablet (20 mg total) by mouth daily. (Patient taking differently: Take 20 mg by mouth daily. Pt is taking one pill by mouth every two weeks.) 30 tablet 6  . IRON, FERROUS GLUCONATE, PO Take 27 mg by mouth.    . vitamin B-12 (CYANOCOBALAMIN) 1000 MCG tablet Take  1,000 mcg by mouth daily.     No current facility-administered medications for this visit.    No Known Allergies  Review of Systems negative except from HPI and PMH  Physical Exam BP 152/82 mmHg  Pulse 79  Ht 5\' 2"  (1.575 m)  Wt 115 lb 12.8 oz (52.527 kg)  BMI 21.17 kg/m2 Well developed and nourished in no acute distress HENT normal Neck supple with JVP-flat Carotids brisk and full without bruits Clear Irregularly irregular rate and rhythm with controlled ventricular response, 2/6 systolic murmur and a very for the RR intervalAbd-soft with active BS without hepatomegaly No Clubbing cyanosis trace edema Skin-warm and dry A & Oriented  Grossly normal sensory and motor function   ECG  Atrial fib at 79 -/15/40  Assessment and  Plan  Atrial fibrillation  Hypertension     We had a discussion regarding the importance of ongoing medical therapy for her blood pressure and risk reduction for stroke with her atrial fibrillation.    Continues current meds

## 2014-05-05 NOTE — Patient Instructions (Signed)
Your physician recommends that you continue on your current medications as directed. Please refer to the Current Medication list given to you today.  Your physician wants you to follow-up in: 6 months with Dr. Klein. You will receive a reminder letter in the mail two months in advance. If you don't receive a letter, please call our office to schedule the follow-up appointment.  

## 2014-05-12 DIAGNOSIS — M1712 Unilateral primary osteoarthritis, left knee: Secondary | ICD-10-CM | POA: Diagnosis not present

## 2014-05-13 DIAGNOSIS — Z7901 Long term (current) use of anticoagulants: Secondary | ICD-10-CM | POA: Diagnosis not present

## 2014-05-13 DIAGNOSIS — Z79899 Other long term (current) drug therapy: Secondary | ICD-10-CM | POA: Diagnosis not present

## 2014-05-13 DIAGNOSIS — I4891 Unspecified atrial fibrillation: Secondary | ICD-10-CM | POA: Diagnosis not present

## 2014-05-13 DIAGNOSIS — R350 Frequency of micturition: Secondary | ICD-10-CM | POA: Diagnosis not present

## 2014-05-13 DIAGNOSIS — M1712 Unilateral primary osteoarthritis, left knee: Secondary | ICD-10-CM | POA: Diagnosis not present

## 2014-05-13 DIAGNOSIS — Z01818 Encounter for other preprocedural examination: Secondary | ICD-10-CM | POA: Diagnosis not present

## 2014-05-13 DIAGNOSIS — I517 Cardiomegaly: Secondary | ICD-10-CM | POA: Diagnosis not present

## 2014-05-13 DIAGNOSIS — Z87898 Personal history of other specified conditions: Secondary | ICD-10-CM | POA: Diagnosis not present

## 2014-05-13 DIAGNOSIS — Z87440 Personal history of urinary (tract) infections: Secondary | ICD-10-CM | POA: Diagnosis not present

## 2014-05-20 DIAGNOSIS — N390 Urinary tract infection, site not specified: Secondary | ICD-10-CM | POA: Diagnosis not present

## 2014-05-20 DIAGNOSIS — R300 Dysuria: Secondary | ICD-10-CM | POA: Diagnosis not present

## 2014-05-22 DIAGNOSIS — I868 Varicose veins of other specified sites: Secondary | ICD-10-CM | POA: Diagnosis not present

## 2014-05-27 DIAGNOSIS — R0789 Other chest pain: Secondary | ICD-10-CM | POA: Diagnosis not present

## 2014-05-27 DIAGNOSIS — R42 Dizziness and giddiness: Secondary | ICD-10-CM | POA: Diagnosis not present

## 2014-05-27 DIAGNOSIS — R079 Chest pain, unspecified: Secondary | ICD-10-CM | POA: Diagnosis not present

## 2014-05-27 DIAGNOSIS — R110 Nausea: Secondary | ICD-10-CM | POA: Diagnosis not present

## 2014-05-27 DIAGNOSIS — I252 Old myocardial infarction: Secondary | ICD-10-CM | POA: Diagnosis not present

## 2014-05-27 DIAGNOSIS — I482 Chronic atrial fibrillation: Secondary | ICD-10-CM | POA: Diagnosis not present

## 2014-05-27 DIAGNOSIS — I129 Hypertensive chronic kidney disease with stage 1 through stage 4 chronic kidney disease, or unspecified chronic kidney disease: Secondary | ICD-10-CM | POA: Diagnosis not present

## 2014-05-27 DIAGNOSIS — G629 Polyneuropathy, unspecified: Secondary | ICD-10-CM | POA: Diagnosis not present

## 2014-05-27 DIAGNOSIS — R072 Precordial pain: Secondary | ICD-10-CM | POA: Diagnosis not present

## 2014-05-27 DIAGNOSIS — R112 Nausea with vomiting, unspecified: Secondary | ICD-10-CM | POA: Diagnosis not present

## 2014-05-27 DIAGNOSIS — N189 Chronic kidney disease, unspecified: Secondary | ICD-10-CM | POA: Diagnosis not present

## 2014-05-27 DIAGNOSIS — G4700 Insomnia, unspecified: Secondary | ICD-10-CM | POA: Diagnosis not present

## 2014-05-27 DIAGNOSIS — S0990XA Unspecified injury of head, initial encounter: Secondary | ICD-10-CM | POA: Diagnosis not present

## 2014-05-28 DIAGNOSIS — R079 Chest pain, unspecified: Secondary | ICD-10-CM | POA: Diagnosis not present

## 2014-06-23 ENCOUNTER — Ambulatory Visit (INDEPENDENT_AMBULATORY_CARE_PROVIDER_SITE_OTHER): Payer: Medicare Other | Admitting: Physician Assistant

## 2014-06-23 ENCOUNTER — Encounter: Payer: Self-pay | Admitting: Physician Assistant

## 2014-06-23 VITALS — BP 158/90 | HR 87 | Ht 62.0 in | Wt 117.8 lb

## 2014-06-23 DIAGNOSIS — I480 Paroxysmal atrial fibrillation: Secondary | ICD-10-CM

## 2014-06-23 DIAGNOSIS — Z01818 Encounter for other preprocedural examination: Secondary | ICD-10-CM | POA: Insufficient documentation

## 2014-06-23 DIAGNOSIS — I5030 Unspecified diastolic (congestive) heart failure: Secondary | ICD-10-CM

## 2014-06-23 DIAGNOSIS — I509 Heart failure, unspecified: Secondary | ICD-10-CM

## 2014-06-23 DIAGNOSIS — I10 Essential (primary) hypertension: Secondary | ICD-10-CM

## 2014-06-23 DIAGNOSIS — I5032 Chronic diastolic (congestive) heart failure: Secondary | ICD-10-CM

## 2014-06-23 NOTE — Assessment & Plan Note (Signed)
Patient has chronic atrial fibrillation with controlled rate on diltiazem 240 mg once daily. Continue this as well as apixaban. May hold for surgery.

## 2014-06-23 NOTE — Assessment & Plan Note (Signed)
No evidence of heart failure and exam. Need to watch closely during surgery.

## 2014-06-23 NOTE — Progress Notes (Addendum)
Cardiology Office Note   Date:  06/23/2014   ID:  Meaghann, Choo 1927-05-02, MRN [id number]  PCP:  Redge Gainer, MD  Cardiologist:  Jolyn Nap, M.D.  Chief Complaint: Preoperative clearance    History of Present Illness: Jody [name] is a 81 y.o. female who presents for post hospital follow-up after hospitalization at [facility]. Patient was placed on Levaquin for a UTI and had a reaction as well as dehydration and had to be admitted to the hospital.  She also needs total knee replacement and is here for presurgical clearance. She did bring labs that I reviewed and  BNP was elevated at 295, creatinine 1.54, troponin negative. Discharge summary did say she had chest pain but gives no further information. Patient states she had headache, dizziness, visual changes and chest pain from levaquin. She never has chest tightness. 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 She denies chest tightness, palpitations, dyspnea, dyspnea on exertion, dizziness or presyncope.   This is a patient of Dr. Jolyn Nap who has history of chronic atrial fibrillation, HFpEF assoc with RVR, HTN, MR. . 2-D echo 07/04/13 showed normal LV function, severe TR, moderate MR, bilateral atrial dilatation, mild AF, elevated pulmonary pressures. She has history of GI bleed while on Pradaxa requiring transfusions complicated by NSTEM 04/3242.Marland Kitchen Lexi scan Cardiolite 07/2012 no evidence of scar or ischemia EF 60%. She was kept off Pradaxa and placed on apixaban.      Past Medical History  Diagnosis Date  . Afib   . HTN (hypertension)   . Chronic diastolic heart failure   . Mitral regurgitation     Moderate, echo, 07/2012  . Pulmonary hypertension   . Anemia     Past Surgical History  Procedure Laterality Date  . Joint replacement Right Feb 2015    Dr. CaseLedell Noss     Current Outpatient Prescriptions  Medication Sig Dispense Refill  . apixaban  (ELIQUIS) 2.5 MG TABS tablet Take 1 tablet (2.5 mg total) by mouth 2 (two) times daily. 180 tablet 3  . diltiazem (CARTIA XT) 240 MG 24 hr capsule Take 1 capsule (240 mg total) by mouth daily. 90 capsule 3  . furosemide (LASIX) 20 MG tablet Take 1 tablet (20 mg total) by mouth daily as needed for edema. Pt is taking one pill by mouth every two weeks. 10 tablet 3  . IRON, FERROUS GLUCONATE, PO Take 27 mg by mouth.    . vitamin B-12 (CYANOCOBALAMIN) 1000 MCG tablet Take 1,000 mcg by mouth daily.     No current facility-administered medications for this visit.    Allergies:   Review of patient's allergies indicates no known allergies.    Social History:  The patient  reports that she has never smoked. She has never used smokeless tobacco. She reports that she does not drink alcohol or use illicit drugs.   Family History:  The patient's family history includes Diabetes in her mother.    ROS:  Please see the history of present illness.   Otherwise, review of systems are positive for none.   All other systems are reviewed and negative.    PHYSICAL EXAM: BP 158/90 mmHg  Pulse 87  Ht 5\' 2"  (1.575 m)  Wt 117 lb 12.8 oz (53.434 kg)  BMI 21.54 kg/m2 GEN: Well nourished, young looking, well developed, in no acute distress HEENT: normal Neck: no JVD, HJR, carotid bruits, or masses  Cardiac: Irregular irregular; 2/6 systolic murmur at the left sternal border and apex, no rubs, thrill or heave,no edema,   Respiratory:  clear to auscultation bilaterally, normal work of breathing GI: soft, nontender, nondistended, + BS MS: no deformity or atrophy Extremities: without cyanosis, clubbing, edema, good distal pulses bilaterally.  Skin: warm and dry, no rash Neuro:  Strength and sensation are intact Psych: euthymic mood, full affect   EKG:  EKG is ordered today. The ekg ordered today demonstrates atrial fibrillation with left axis deviation and left bundle branch block unchanged from prior  tracings   Recent Labs: 07/22/2013: ALT 8; BUN 19; Creatinine 1.00; Hemoglobin 12.2; Potassium 4.7; Sodium 139; TSH 2.370    Lipid Panel    Component Value Date/Time   CHOL * 02/10/2009 0450    210        ATP III CLASSIFICATION:  <200     mg/dL   Desirable  200-239  mg/dL   Borderline High  >=240    mg/dL   High          TRIG 88 02/10/2009 0450   HDL 57 02/10/2009 0450   CHOLHDL 3.7 02/10/2009 0450   VLDL 18 02/10/2009 0450   LDLCALC * 02/10/2009 0450    135        Total Cholesterol/HDL:CHD Risk Coronary Heart Disease Risk Table                     Men   Women  1/2 Average Risk   3.4   3.3  Average Risk       5.0   4.4  2 X Average Risk   9.6   7.1  3 X Average Risk  23.4   11.0        Use the calculated Patient Ratio above and the CHD Risk Table to determine the patient's CHD Risk.        ATP III CLASSIFICATION (LDL):  <100     mg/dL   Optimal  100-129  mg/dL   Near or Above                    Optimal  130-159  mg/dL   Borderline  160-189  mg/dL   High  >190     mg/dL   Very High      Wt Readings from Last 3 Encounters:  05/05/14 115 lb 12.8 oz (52.527 kg)  07/26/13 115 lb 6.4 oz (52.345 kg)  07/22/13 114 lb (51.71 kg)      Other studies Reviewed: Additional studies/ records that were reviewed today include and review of the records demonstrates:  2-D echo 07/04/13: Study Conclusions  - Left ventricle: The cavity size was normal. Wall thickness   was normal. Systolic function was normal. The estimated   ejection fraction was in the range of 55% to 60%. Wall   motion was normal; there were no regional wall motion   abnormalities. - Aortic valve: Valve area: 1.48cm^2(VTI). Valve area:   1.37cm^2 (Vmax). - Mitral valve: Mild regurgitation. - Left atrium: The atrium was mildly dilated. - Right atrium: The atrium was mildly to moderately dilated. - Tricuspid valve: Moderate-severe regurgitation. - Pulmonary arteries: Systolic pressure was mildly    increased. PA peak pressure: 45mm Hg (S).  Stress Myoview 07/2012 at [facility] normal LV function EF 60%, no evidence of ischemia or scar.   ASSESSMENT AND PLAN: Preoperative clearance Patient is here for preoperative clearance before undergoing total left  knee replacement by Dr. Remo Lipps Case in [place]. Patient had recent hospitalization after reaction to Levaquin and had some chest pain with it. Troponins were negative. She is extremely active for an 81 year old still mowing her lawn and working full-time. She denies any chest pain outside of this drug reaction. She does have chronic atrial fibrillation and has a history of heart failure associated with rapid ventricular rates. Her EF is 60% but she does have severe TR and moderate MR on 2-D echo in 2015. Lexi scan Cardiolite and 2014 showed no evidence of scar or ischemia EF 60%. She is maintained on apixaban and diltiazem. I discussed this patient in detail with Dr. Pernell Dupre who concurs that she is at moderate but acceptable risk for surgery. Follow-up with Dr. Caryl Comes in 6 months.   Atrial fibrillation Patient has chronic atrial fibrillation with controlled rate on diltiazem 240 mg once daily. Continue this as well as apixaban. May hold for surgery.   (HFpEF) heart failure with preserved ejection fraction No evidence of heart failure.   DIASTOLIC HEART FAILURE, CHRONIC No evidence of heart failure and exam. Need to watch closely during surgery.   HTN (hypertension) Patient's blood pressure is up today but she is nervous. It's usually stable. No changes. 2 g sodium diet.   Addendum. Just received discharge summary report from [facility]. Patient had atypical chest pain and was admitted to telemetry. Troponins were negative 3. She also had acute on chronic kidney injury creatinine was up to 1.54, came down to 1.32. CT of head showed atrophy with patchy periventricular small vessel disease, no intracranial mass,  hemorrhage or extra fluid collection.   Sumner Boast, PA-C  06/23/2014 8:08 AM    [facility] [address], [place], [place]  [address] Phone: [phone]; Fax: [phone]

## 2014-06-23 NOTE — Assessment & Plan Note (Signed)
Patient's blood pressure is up today but she is nervous. It's usually stable. No changes. 2 g sodium diet.

## 2014-06-23 NOTE — Assessment & Plan Note (Signed)
No evidence of heart failure. 

## 2014-06-23 NOTE — Assessment & Plan Note (Signed)
Patient is here for preoperative clearance before undergoing total left knee replacement by Dr. Remo Lipps Case in [place]. Patient had recent hospitalization after reaction to Levaquin and had some chest pain with it. Troponins were negative. She is extremely active for an 81 year old still mowing her lawn and working full-time. She denies any chest pain outside of this drug reaction. She does have chronic atrial fibrillation and has a history of heart failure associated with rapid ventricular rates. Her EF is 60% but she does have severe TR and moderate MR on 2-D echo in 2015. Lexi scan Cardiolite and 2014 showed no evidence of scar or ischemia EF 60%. She is maintained on apixaban and diltiazem. I discussed this patient in detail with Dr. Pernell Dupre who concurs that she is at moderate but acceptable risk for surgery. Follow-up with Dr. Caryl Comes in 6 months.

## 2014-06-23 NOTE — Patient Instructions (Signed)
Your physician recommends that you continue on your current medications as directed. Please refer to the Current Medication list given to you today.  Your physician wants you to follow-up in: 6 months with Dr. Klein. You will receive a reminder letter in the mail two months in advance. If you don't receive a letter, please call our office to schedule the follow-up appointment.  

## 2014-06-30 ENCOUNTER — Telehealth: Payer: Self-pay | Admitting: Internal Medicine

## 2014-06-30 NOTE — Telephone Encounter (Signed)
Error

## 2014-07-31 DIAGNOSIS — M1712 Unilateral primary osteoarthritis, left knee: Secondary | ICD-10-CM | POA: Diagnosis not present

## 2014-08-01 DIAGNOSIS — Z79899 Other long term (current) drug therapy: Secondary | ICD-10-CM | POA: Diagnosis not present

## 2014-08-01 DIAGNOSIS — Z96651 Presence of right artificial knee joint: Secondary | ICD-10-CM | POA: Diagnosis not present

## 2014-08-01 DIAGNOSIS — Z87311 Personal history of (healed) other pathological fracture: Secondary | ICD-10-CM | POA: Diagnosis not present

## 2014-08-01 DIAGNOSIS — I4891 Unspecified atrial fibrillation: Secondary | ICD-10-CM | POA: Diagnosis not present

## 2014-08-01 DIAGNOSIS — Z01818 Encounter for other preprocedural examination: Secondary | ICD-10-CM | POA: Diagnosis not present

## 2014-08-01 DIAGNOSIS — M1712 Unilateral primary osteoarthritis, left knee: Secondary | ICD-10-CM | POA: Diagnosis not present

## 2014-08-01 DIAGNOSIS — Z7901 Long term (current) use of anticoagulants: Secondary | ICD-10-CM | POA: Diagnosis not present

## 2014-08-01 DIAGNOSIS — I517 Cardiomegaly: Secondary | ICD-10-CM | POA: Diagnosis not present

## 2014-08-01 DIAGNOSIS — I252 Old myocardial infarction: Secondary | ICD-10-CM | POA: Diagnosis not present

## 2014-08-01 DIAGNOSIS — I10 Essential (primary) hypertension: Secondary | ICD-10-CM | POA: Diagnosis not present

## 2014-08-01 DIAGNOSIS — Z87440 Personal history of urinary (tract) infections: Secondary | ICD-10-CM | POA: Diagnosis not present

## 2014-08-05 DIAGNOSIS — Z01818 Encounter for other preprocedural examination: Secondary | ICD-10-CM | POA: Diagnosis not present

## 2014-08-05 DIAGNOSIS — I482 Chronic atrial fibrillation: Secondary | ICD-10-CM | POA: Diagnosis not present

## 2014-08-05 DIAGNOSIS — Z87311 Personal history of (healed) other pathological fracture: Secondary | ICD-10-CM | POA: Diagnosis not present

## 2014-08-05 DIAGNOSIS — R278 Other lack of coordination: Secondary | ICD-10-CM | POA: Diagnosis not present

## 2014-08-05 DIAGNOSIS — I252 Old myocardial infarction: Secondary | ICD-10-CM | POA: Diagnosis not present

## 2014-08-05 DIAGNOSIS — M179 Osteoarthritis of knee, unspecified: Secondary | ICD-10-CM | POA: Diagnosis not present

## 2014-08-05 DIAGNOSIS — Z96651 Presence of right artificial knee joint: Secondary | ICD-10-CM | POA: Diagnosis not present

## 2014-08-05 DIAGNOSIS — I4891 Unspecified atrial fibrillation: Secondary | ICD-10-CM | POA: Diagnosis not present

## 2014-08-05 DIAGNOSIS — I517 Cardiomegaly: Secondary | ICD-10-CM | POA: Diagnosis not present

## 2014-08-05 DIAGNOSIS — Z96652 Presence of left artificial knee joint: Secondary | ICD-10-CM | POA: Diagnosis not present

## 2014-08-05 DIAGNOSIS — G3184 Mild cognitive impairment, so stated: Secondary | ICD-10-CM | POA: Diagnosis not present

## 2014-08-05 DIAGNOSIS — R488 Other symbolic dysfunctions: Secondary | ICD-10-CM | POA: Diagnosis not present

## 2014-08-05 DIAGNOSIS — Z883 Allergy status to other anti-infective agents status: Secondary | ICD-10-CM | POA: Diagnosis not present

## 2014-08-05 DIAGNOSIS — M6281 Muscle weakness (generalized): Secondary | ICD-10-CM | POA: Diagnosis not present

## 2014-08-05 DIAGNOSIS — M1712 Unilateral primary osteoarthritis, left knee: Secondary | ICD-10-CM | POA: Diagnosis not present

## 2014-08-05 DIAGNOSIS — Z471 Aftercare following joint replacement surgery: Secondary | ICD-10-CM | POA: Diagnosis not present

## 2014-08-05 DIAGNOSIS — Z7901 Long term (current) use of anticoagulants: Secondary | ICD-10-CM | POA: Diagnosis not present

## 2014-08-05 DIAGNOSIS — I209 Angina pectoris, unspecified: Secondary | ICD-10-CM | POA: Diagnosis not present

## 2014-08-05 DIAGNOSIS — R262 Difficulty in walking, not elsewhere classified: Secondary | ICD-10-CM | POA: Diagnosis not present

## 2014-08-05 DIAGNOSIS — Z87440 Personal history of urinary (tract) infections: Secondary | ICD-10-CM | POA: Diagnosis not present

## 2014-08-05 DIAGNOSIS — I10 Essential (primary) hypertension: Secondary | ICD-10-CM | POA: Diagnosis not present

## 2014-08-05 DIAGNOSIS — Z79899 Other long term (current) drug therapy: Secondary | ICD-10-CM | POA: Diagnosis not present

## 2014-08-07 DIAGNOSIS — I252 Old myocardial infarction: Secondary | ICD-10-CM | POA: Diagnosis not present

## 2014-08-07 DIAGNOSIS — I209 Angina pectoris, unspecified: Secondary | ICD-10-CM | POA: Diagnosis not present

## 2014-08-07 DIAGNOSIS — R262 Difficulty in walking, not elsewhere classified: Secondary | ICD-10-CM | POA: Diagnosis not present

## 2014-08-07 DIAGNOSIS — M179 Osteoarthritis of knee, unspecified: Secondary | ICD-10-CM | POA: Diagnosis not present

## 2014-08-07 DIAGNOSIS — M170 Bilateral primary osteoarthritis of knee: Secondary | ICD-10-CM | POA: Diagnosis not present

## 2014-08-07 DIAGNOSIS — Z79899 Other long term (current) drug therapy: Secondary | ICD-10-CM | POA: Diagnosis not present

## 2014-08-07 DIAGNOSIS — R278 Other lack of coordination: Secondary | ICD-10-CM | POA: Diagnosis not present

## 2014-08-07 DIAGNOSIS — E162 Hypoglycemia, unspecified: Secondary | ICD-10-CM | POA: Diagnosis not present

## 2014-08-07 DIAGNOSIS — Z96652 Presence of left artificial knee joint: Secondary | ICD-10-CM | POA: Diagnosis not present

## 2014-08-07 DIAGNOSIS — I10 Essential (primary) hypertension: Secondary | ICD-10-CM | POA: Diagnosis not present

## 2014-08-07 DIAGNOSIS — I4891 Unspecified atrial fibrillation: Secondary | ICD-10-CM | POA: Diagnosis not present

## 2014-08-07 DIAGNOSIS — M6281 Muscle weakness (generalized): Secondary | ICD-10-CM | POA: Diagnosis not present

## 2014-08-07 DIAGNOSIS — Z96651 Presence of right artificial knee joint: Secondary | ICD-10-CM | POA: Diagnosis not present

## 2014-08-07 DIAGNOSIS — R488 Other symbolic dysfunctions: Secondary | ICD-10-CM | POA: Diagnosis not present

## 2014-08-07 DIAGNOSIS — G3184 Mild cognitive impairment, so stated: Secondary | ICD-10-CM | POA: Diagnosis not present

## 2014-08-07 DIAGNOSIS — I517 Cardiomegaly: Secondary | ICD-10-CM | POA: Diagnosis not present

## 2014-08-07 DIAGNOSIS — M1712 Unilateral primary osteoarthritis, left knee: Secondary | ICD-10-CM | POA: Diagnosis not present

## 2014-08-26 DIAGNOSIS — M170 Bilateral primary osteoarthritis of knee: Secondary | ICD-10-CM | POA: Diagnosis not present

## 2014-08-26 DIAGNOSIS — E162 Hypoglycemia, unspecified: Secondary | ICD-10-CM | POA: Diagnosis not present

## 2014-08-26 DIAGNOSIS — I10 Essential (primary) hypertension: Secondary | ICD-10-CM | POA: Diagnosis not present

## 2014-08-28 DIAGNOSIS — Z96652 Presence of left artificial knee joint: Secondary | ICD-10-CM | POA: Diagnosis not present

## 2014-08-28 DIAGNOSIS — Z7901 Long term (current) use of anticoagulants: Secondary | ICD-10-CM | POA: Diagnosis not present

## 2014-08-28 DIAGNOSIS — I4891 Unspecified atrial fibrillation: Secondary | ICD-10-CM | POA: Diagnosis not present

## 2014-08-28 DIAGNOSIS — I252 Old myocardial infarction: Secondary | ICD-10-CM | POA: Diagnosis not present

## 2014-08-28 DIAGNOSIS — I10 Essential (primary) hypertension: Secondary | ICD-10-CM | POA: Diagnosis not present

## 2014-08-29 DIAGNOSIS — L03119 Cellulitis of unspecified part of limb: Secondary | ICD-10-CM | POA: Diagnosis not present

## 2014-08-29 DIAGNOSIS — I10 Essential (primary) hypertension: Secondary | ICD-10-CM | POA: Diagnosis not present

## 2014-08-29 DIAGNOSIS — I252 Old myocardial infarction: Secondary | ICD-10-CM | POA: Diagnosis not present

## 2014-08-29 DIAGNOSIS — Z96652 Presence of left artificial knee joint: Secondary | ICD-10-CM | POA: Diagnosis not present

## 2014-08-29 DIAGNOSIS — Z7901 Long term (current) use of anticoagulants: Secondary | ICD-10-CM | POA: Diagnosis not present

## 2014-08-29 DIAGNOSIS — I4891 Unspecified atrial fibrillation: Secondary | ICD-10-CM | POA: Diagnosis not present

## 2014-09-02 DIAGNOSIS — Z96652 Presence of left artificial knee joint: Secondary | ICD-10-CM | POA: Diagnosis not present

## 2014-09-02 DIAGNOSIS — I4891 Unspecified atrial fibrillation: Secondary | ICD-10-CM | POA: Diagnosis not present

## 2014-09-02 DIAGNOSIS — I10 Essential (primary) hypertension: Secondary | ICD-10-CM | POA: Diagnosis not present

## 2014-09-02 DIAGNOSIS — I252 Old myocardial infarction: Secondary | ICD-10-CM | POA: Diagnosis not present

## 2014-09-02 DIAGNOSIS — Z7901 Long term (current) use of anticoagulants: Secondary | ICD-10-CM | POA: Diagnosis not present

## 2014-09-04 DIAGNOSIS — Z7901 Long term (current) use of anticoagulants: Secondary | ICD-10-CM | POA: Diagnosis not present

## 2014-09-04 DIAGNOSIS — Z96652 Presence of left artificial knee joint: Secondary | ICD-10-CM | POA: Diagnosis not present

## 2014-09-04 DIAGNOSIS — I4891 Unspecified atrial fibrillation: Secondary | ICD-10-CM | POA: Diagnosis not present

## 2014-09-04 DIAGNOSIS — I252 Old myocardial infarction: Secondary | ICD-10-CM | POA: Diagnosis not present

## 2014-09-04 DIAGNOSIS — I10 Essential (primary) hypertension: Secondary | ICD-10-CM | POA: Diagnosis not present

## 2014-09-05 DIAGNOSIS — I10 Essential (primary) hypertension: Secondary | ICD-10-CM | POA: Diagnosis not present

## 2014-09-05 DIAGNOSIS — I252 Old myocardial infarction: Secondary | ICD-10-CM | POA: Diagnosis not present

## 2014-09-05 DIAGNOSIS — I4891 Unspecified atrial fibrillation: Secondary | ICD-10-CM | POA: Diagnosis not present

## 2014-09-05 DIAGNOSIS — Z96652 Presence of left artificial knee joint: Secondary | ICD-10-CM | POA: Diagnosis not present

## 2014-09-05 DIAGNOSIS — Z7901 Long term (current) use of anticoagulants: Secondary | ICD-10-CM | POA: Diagnosis not present

## 2014-09-09 DIAGNOSIS — Z96652 Presence of left artificial knee joint: Secondary | ICD-10-CM | POA: Diagnosis not present

## 2014-09-09 DIAGNOSIS — I252 Old myocardial infarction: Secondary | ICD-10-CM | POA: Diagnosis not present

## 2014-09-09 DIAGNOSIS — I4891 Unspecified atrial fibrillation: Secondary | ICD-10-CM | POA: Diagnosis not present

## 2014-09-09 DIAGNOSIS — I10 Essential (primary) hypertension: Secondary | ICD-10-CM | POA: Diagnosis not present

## 2014-09-09 DIAGNOSIS — Z7901 Long term (current) use of anticoagulants: Secondary | ICD-10-CM | POA: Diagnosis not present

## 2014-09-11 DIAGNOSIS — I4891 Unspecified atrial fibrillation: Secondary | ICD-10-CM | POA: Diagnosis not present

## 2014-09-11 DIAGNOSIS — I252 Old myocardial infarction: Secondary | ICD-10-CM | POA: Diagnosis not present

## 2014-09-11 DIAGNOSIS — I10 Essential (primary) hypertension: Secondary | ICD-10-CM | POA: Diagnosis not present

## 2014-09-11 DIAGNOSIS — Z7901 Long term (current) use of anticoagulants: Secondary | ICD-10-CM | POA: Diagnosis not present

## 2014-09-11 DIAGNOSIS — Z96652 Presence of left artificial knee joint: Secondary | ICD-10-CM | POA: Diagnosis not present

## 2014-09-16 DIAGNOSIS — I4891 Unspecified atrial fibrillation: Secondary | ICD-10-CM | POA: Diagnosis not present

## 2014-09-16 DIAGNOSIS — Z7901 Long term (current) use of anticoagulants: Secondary | ICD-10-CM | POA: Diagnosis not present

## 2014-09-16 DIAGNOSIS — I10 Essential (primary) hypertension: Secondary | ICD-10-CM | POA: Diagnosis not present

## 2014-09-16 DIAGNOSIS — Z96652 Presence of left artificial knee joint: Secondary | ICD-10-CM | POA: Diagnosis not present

## 2014-09-16 DIAGNOSIS — I252 Old myocardial infarction: Secondary | ICD-10-CM | POA: Diagnosis not present

## 2014-09-17 DIAGNOSIS — M1712 Unilateral primary osteoarthritis, left knee: Secondary | ICD-10-CM | POA: Diagnosis not present

## 2014-09-18 DIAGNOSIS — I252 Old myocardial infarction: Secondary | ICD-10-CM | POA: Diagnosis not present

## 2014-09-18 DIAGNOSIS — I4891 Unspecified atrial fibrillation: Secondary | ICD-10-CM | POA: Diagnosis not present

## 2014-09-18 DIAGNOSIS — Z7901 Long term (current) use of anticoagulants: Secondary | ICD-10-CM | POA: Diagnosis not present

## 2014-09-18 DIAGNOSIS — I10 Essential (primary) hypertension: Secondary | ICD-10-CM | POA: Diagnosis not present

## 2014-09-18 DIAGNOSIS — Z96652 Presence of left artificial knee joint: Secondary | ICD-10-CM | POA: Diagnosis not present

## 2014-11-03 DIAGNOSIS — M1712 Unilateral primary osteoarthritis, left knee: Secondary | ICD-10-CM | POA: Diagnosis not present

## 2015-01-19 DIAGNOSIS — Z96652 Presence of left artificial knee joint: Secondary | ICD-10-CM | POA: Diagnosis not present

## 2015-01-19 DIAGNOSIS — M13162 Monoarthritis, not elsewhere classified, left knee: Secondary | ICD-10-CM | POA: Diagnosis not present

## 2015-03-12 ENCOUNTER — Telehealth: Payer: Self-pay | Admitting: Family Medicine

## 2015-05-05 ENCOUNTER — Ambulatory Visit (INDEPENDENT_AMBULATORY_CARE_PROVIDER_SITE_OTHER): Payer: Medicare Other | Admitting: Internal Medicine

## 2015-05-05 ENCOUNTER — Encounter: Payer: Self-pay | Admitting: Internal Medicine

## 2015-05-05 VITALS — BP 124/88 | HR 70 | Ht 62.0 in | Wt 121.0 lb

## 2015-05-05 DIAGNOSIS — I480 Paroxysmal atrial fibrillation: Secondary | ICD-10-CM

## 2015-05-05 DIAGNOSIS — I482 Chronic atrial fibrillation, unspecified: Secondary | ICD-10-CM

## 2015-05-05 MED ORDER — FUROSEMIDE 20 MG PO TABS: 20.0000 mg | ORAL_TABLET | Freq: Every day | ORAL | Status: DC | PRN

## 2015-05-05 MED ORDER — DILTIAZEM HCL ER COATED BEADS 240 MG PO CP24: 240.0000 mg | ORAL_CAPSULE | Freq: Every day | ORAL | Status: DC

## 2015-05-05 MED ORDER — APIXABAN 2.5 MG PO TABS: 2.5000 mg | ORAL_TABLET | Freq: Two times a day (BID) | ORAL | Status: DC

## 2015-05-05 NOTE — Patient Instructions (Signed)

## 2015-05-05 NOTE — Progress Notes (Signed)
.  k[facility] Patient Care Team: Chipper Herb, MD as PCP - General (Family Medicine)   HPI  Jody [name] is a 82 y.o. female  is seen in followup for atrial fibrillation with a prior problem with rapid ventricular response.    She underwent In 2014 she had a nuclear scanning demonstrating normal perfusion and normal left ventricular function. An echocardiogram 2014 also demonstrated normal left ventricular function withmoderate LVH MR and PHTN. Mild aortic stenosis was identified.   At her last visit she was started on apixaban and her aspirin was discontinued.    Echocardiogram 4/14 demonstrated normal left ventricular systolic function moderate left atrial dilatation with evidence of diastolic dysfunction and mild aortic stenosis  She is currently treated with diltiazem for rate control.  She takes her meds regularly x diuretic which she takes as needed  The patient denies chest pain, shortness of breath, nocturnal dyspnea, orthopnea or peripheral edema.  There have been no palpitations, lightheadedness or syncope.    She had a knee replacement in May. She is still working on rehabilitation she is back at work full-time  Past Medical History  Diagnosis Date  . Afib ([place])   . HTN (hypertension)   . Chronic diastolic heart failure ([place])   . Mitral regurgitation     Moderate, echo, 07/2012  . Pulmonary hypertension ([place])   . Anemia     Past Surgical History  Procedure Laterality Date  . Joint replacement Right Feb 2015    Dr. CaseLedell Noss    Current Outpatient Prescriptions  Medication Sig Dispense Refill  . apixaban (ELIQUIS) 2.5 MG TABS tablet Take 1 tablet (2.5 mg total) by mouth 2 (two) times daily. 180 tablet 3  . diltiazem (CARTIA XT) 240 MG 24 hr capsule Take 1 capsule (240 mg total) by mouth daily. 90 capsule 3  . furosemide (LASIX) 20 MG tablet Take 1 tablet (20 mg total) by mouth daily as needed for edema. Pt is taking one pill by mouth every two weeks. 10 tablet 3  .  IRON, FERROUS GLUCONATE, PO Take 27 mg by mouth.    . vitamin B-12 (CYANOCOBALAMIN) 1000 MCG tablet Take 1,000 mcg by mouth daily.     No current facility-administered medications for this visit.    Allergies  Allergen Reactions  . Levaquin [Levofloxacin] Other (See Comments)    Patient stated vision became blurry and became weak.    Review of Systems negative except from HPI and PMH  Physical Exam BP 124/88 mmHg  Pulse 70  Ht 5\' 2"  (1.575 m)  Wt 121 lb (54.885 kg)  BMI 22.13 kg/m2 Well developed and nourished in no acute distress HENT normal Neck supple with JVP-flat Carotids brisk and full without bruits Clear Irregularly irregular rate and rhythm with controlled ventricular response, 2/6 systolic murmur that varies  RR intervalAbd-soft with active BS without hepatomegaly No Clubbing cyanosis trace edema Skin-warm and dry A & Oriented  Grossly normal sensory and motor function   ECG  Atrial fib at 81 -/15/45  Assessment and  Plan  Atrial fibrillation  Hypertension   LBBB  Blood pressure is well-controlled. She has no significant symptoms. We will continue her current medications. She has no bleeding issues of which she is aware  Her blood work is followed by her primary care physicians to whom we do not have computer access

## 2015-05-24 ENCOUNTER — Other Ambulatory Visit: Payer: Self-pay | Admitting: Internal Medicine

## 2015-05-25 NOTE — Telephone Encounter (Signed)
Rx request sent to pharmacy.  

## 2015-06-15 ENCOUNTER — Telehealth: Payer: Self-pay | Admitting: Family Medicine

## 2015-07-05 DIAGNOSIS — R197 Diarrhea, unspecified: Secondary | ICD-10-CM | POA: Diagnosis not present

## 2015-09-05 DIAGNOSIS — R319 Hematuria, unspecified: Secondary | ICD-10-CM | POA: Diagnosis not present

## 2015-09-05 DIAGNOSIS — N3001 Acute cystitis with hematuria: Secondary | ICD-10-CM | POA: Diagnosis not present

## 2015-09-05 DIAGNOSIS — S20221A Contusion of right back wall of thorax, initial encounter: Secondary | ICD-10-CM | POA: Diagnosis not present

## 2015-09-05 DIAGNOSIS — W19XXXA Unspecified fall, initial encounter: Secondary | ICD-10-CM | POA: Diagnosis not present

## 2015-10-07 DIAGNOSIS — L02611 Cutaneous abscess of right foot: Secondary | ICD-10-CM | POA: Diagnosis not present

## 2015-10-20 DIAGNOSIS — S81801A Unspecified open wound, right lower leg, initial encounter: Secondary | ICD-10-CM | POA: Diagnosis not present

## 2015-10-20 DIAGNOSIS — X58XXXA Exposure to other specified factors, initial encounter: Secondary | ICD-10-CM | POA: Diagnosis not present

## 2015-10-20 DIAGNOSIS — I83891 Varicose veins of right lower extremities with other complications: Secondary | ICD-10-CM | POA: Diagnosis not present

## 2015-10-20 DIAGNOSIS — I868 Varicose veins of other specified sites: Secondary | ICD-10-CM | POA: Diagnosis not present

## 2015-10-20 DIAGNOSIS — R58 Hemorrhage, not elsewhere classified: Secondary | ICD-10-CM | POA: Diagnosis not present

## 2015-10-20 DIAGNOSIS — I252 Old myocardial infarction: Secondary | ICD-10-CM | POA: Diagnosis not present

## 2015-10-20 DIAGNOSIS — Z7901 Long term (current) use of anticoagulants: Secondary | ICD-10-CM | POA: Diagnosis not present

## 2015-10-20 DIAGNOSIS — Z79899 Other long term (current) drug therapy: Secondary | ICD-10-CM | POA: Diagnosis not present

## 2015-10-20 DIAGNOSIS — I10 Essential (primary) hypertension: Secondary | ICD-10-CM | POA: Diagnosis not present

## 2015-10-22 DIAGNOSIS — Z7901 Long term (current) use of anticoagulants: Secondary | ICD-10-CM | POA: Diagnosis not present

## 2015-10-22 DIAGNOSIS — I252 Old myocardial infarction: Secondary | ICD-10-CM | POA: Diagnosis not present

## 2015-10-22 DIAGNOSIS — Z79899 Other long term (current) drug therapy: Secondary | ICD-10-CM | POA: Diagnosis not present

## 2015-10-22 DIAGNOSIS — I83899 Varicose veins of unspecified lower extremities with other complications: Secondary | ICD-10-CM | POA: Diagnosis not present

## 2015-10-22 DIAGNOSIS — I10 Essential (primary) hypertension: Secondary | ICD-10-CM | POA: Diagnosis not present

## 2015-10-22 DIAGNOSIS — Z5189 Encounter for other specified aftercare: Secondary | ICD-10-CM | POA: Diagnosis not present

## 2015-10-26 ENCOUNTER — Ambulatory Visit: Payer: Medicare Other | Admitting: Family

## 2015-10-26 ENCOUNTER — Encounter: Payer: Self-pay | Admitting: Family

## 2015-10-26 ENCOUNTER — Ambulatory Visit (INDEPENDENT_AMBULATORY_CARE_PROVIDER_SITE_OTHER): Payer: Medicare Other | Admitting: Family

## 2015-10-26 VITALS — BP 136/77 | HR 74 | Temp 98.6°F | Ht 62.0 in | Wt 120.2 lb

## 2015-10-26 DIAGNOSIS — L03115 Cellulitis of right lower limb: Secondary | ICD-10-CM

## 2015-10-26 MED ORDER — SULFAMETHOXAZOLE-TRIMETHOPRIM 800-160 MG PO TABS: 1.0000 | ORAL_TABLET | Freq: Two times a day (BID) | ORAL | 0 refills | Status: DC

## 2015-10-26 MED ORDER — CEFTRIAXONE SODIUM 1 G IJ SOLR
1.0000 g | Freq: Once | INTRAMUSCULAR | Status: AC
  Administered 2015-10-26: 1 g via INTRAMUSCULAR

## 2015-10-26 NOTE — Progress Notes (Signed)
   Subjective:    Patient ID: Jody [name], female    DOB: 25-May-1927, 82 y.o.   MRN: [id number]  HPI PT presents to the office today with right ankle infection. Pt states she noticed it about two weeks after she scratched it while mowing the yard and it has become worse.  Pt states it has spread and started draining a "yellow pus". Pt states she is having constant pain of 10 out 10. Pt states the pain is worse at night. Pt has tried soaking with no relief.    Review of Systems  Respiratory: Negative.   Genitourinary: Negative.   Musculoskeletal: Positive for gait problem and joint swelling.       Objective:   Physical Exam  Constitutional: She is oriented to person, place, and time.  Cardiovascular: Normal rate and regular rhythm.   Murmur heard. Pulmonary/Chest: Effort normal and breath sounds normal. No respiratory distress. She has no wheezes.  Abdominal: Soft. Bowel sounds are normal.  Neurological: She is alert and oriented to person, place, and time.  Skin: Rash noted.  Abrasion on right lateral ankle with serous drainage, tenderness and swelling present. Slighter erythemas boarder   BP 136/77   Pulse 74   Temp 98.6 F (37 C) (Oral)   Ht 5\' 2"  (1.575 m)   Wt 120 lb 3.2 oz (54.5 kg)   BMI 21.98 kg/m   Area cleaned and antibiotic ointment applied and ace bandage       Assessment & Plan:  1. Cellulitis of right ankle -Rest -Keep elevated -Change dressing daily -Report any increase in swelling, redness, fever, or drainage -RTO in 7 days - cefTRIAXone (ROCEPHIN) injection 1 g; Inject 1 g into the muscle once. - sulfamethoxazole-trimethoprim (BACTRIM DS) 800-160 MG tablet; Take 1 tablet by mouth 2 (two) times daily.  Dispense: 14 tablet; Refill: 0  Evelina Dun, FNP

## 2015-10-26 NOTE — Patient Instructions (Signed)

## 2015-10-27 DIAGNOSIS — Z4802 Encounter for removal of sutures: Secondary | ICD-10-CM | POA: Diagnosis not present

## 2015-10-30 ENCOUNTER — Ambulatory Visit (INDEPENDENT_AMBULATORY_CARE_PROVIDER_SITE_OTHER): Payer: Medicare Other | Admitting: Family Medicine

## 2015-10-30 ENCOUNTER — Encounter: Payer: Self-pay | Admitting: Family Medicine

## 2015-10-30 VITALS — BP 120/62 | HR 87 | Temp 97.1°F | Ht 62.0 in | Wt 120.8 lb

## 2015-10-30 DIAGNOSIS — I878 Other specified disorders of veins: Secondary | ICD-10-CM | POA: Diagnosis not present

## 2015-10-30 DIAGNOSIS — L03115 Cellulitis of right lower limb: Secondary | ICD-10-CM | POA: Diagnosis not present

## 2015-10-30 MED ORDER — TRAMADOL HCL 50 MG PO TABS: 50.0000 mg | ORAL_TABLET | Freq: Three times a day (TID) | ORAL | 0 refills | Status: DC | PRN

## 2015-10-30 MED ORDER — CEPHALEXIN 500 MG PO CAPS: 500.0000 mg | ORAL_CAPSULE | Freq: Four times a day (QID) | ORAL | 0 refills | Status: DC

## 2015-10-30 NOTE — Patient Instructions (Addendum)
Great to meet you!  Please wrap her leg and take the dressing off 2-3 times a day.   Elevate her leg as often as possible. The extra fluid in your leg is allowing you to have the drainage.  Stop taking Bactrim, start Keflex.

## 2015-10-30 NOTE — Progress Notes (Signed)
   HPI  Patient presents today here for follow-up of right lower extremity cellulitis.  Patient explains that her symptoms have not improved since starting Bactrim and getting a Rocephin injection. She continues to have drainage out of her right lateral ankle, swelling of the leg, and pain.  He denies fever, chills, sweats, or purulent drainage. She has clear to yellow drainage from the area.  She states that Bactrim is causing her to feel very weak and she would like to change antibiotics.  She has been elevating her leg minimally.  She's continued to wear her compression stockings on the left lower extremity but not on the right cannot fitted over the swollen leg.  A couple weeks ago she had a bleed of her right medial lower extremity which was treated with a suture at [facility].   PMH: Smoking status noted ROS: Per HPI  Objective: BP 120/62   Pulse 87   Temp 97.1 F (36.2 C) (Oral)   Ht 5\' 2"  (1.575 m)   Wt 120 lb 12.8 oz (54.8 kg)   BMI 22.09 kg/m  Gen: NAD, alert, cooperative with exam HEENT: NCAT CV: RRR, good S1/S2, no murmur Resp: CTABL, no wheezes, non-labored Ext: Right lower extremity swollen greater than left lower extremity, 36 cm versus 31 cm leg circumference on the left.   Skin: Right lateral malleolus area has an erosion and clear drainage just posterior to the malleolus. The area is approximately or centimeters by 2 cm an irregularly shaped.  Today have covered this in plain gauze, wrapped with rolled gauze, and then applying moderate compression I placed a Ace bandage.  Assessment and plan:  # Venous stasis, right lower extremity cellulitis Changing antibiotics to Keflex, she's not tolerating Bactrim and I don't believe this is a MRSA infection Recommended staying off her feet as much as possible, elevating the leg, and using the Ace bandage for compression. Use compression stocking as soon as possible, she continues to use on the left lower  extremity which I believe accounts for the leg circumference discrepancy. I was initially concerned about blood clot, however she is on Eliquis making this much less likely.  Return to clinic in 1 week for recheck.    Meds ordered this encounter  Medications  . cephALEXin (KEFLEX) 500 MG capsule    Sig: Take 1 capsule (500 mg total) by mouth 4 (four) times daily.    Dispense:  28 capsule    Refill:  0  . traMADol (ULTRAM) 50 MG tablet    Sig: Take 1 tablet (50 mg total) by mouth every 8 (eight) hours as needed.    Dispense:  20 tablet    Refill:  0    Laroy Apple, MD [facility] 10/30/2015, 10:56 AM

## 2015-11-05 ENCOUNTER — Encounter: Payer: Self-pay | Admitting: Family

## 2015-11-05 ENCOUNTER — Ambulatory Visit (INDEPENDENT_AMBULATORY_CARE_PROVIDER_SITE_OTHER): Payer: Medicare Other | Admitting: Family

## 2015-11-05 VITALS — BP 134/68 | HR 87 | Temp 98.1°F | Ht 62.0 in | Wt 118.8 lb

## 2015-11-05 DIAGNOSIS — L03115 Cellulitis of right lower limb: Secondary | ICD-10-CM

## 2015-11-05 DIAGNOSIS — I83013 Varicose veins of right lower extremity with ulcer of ankle: Secondary | ICD-10-CM

## 2015-11-05 DIAGNOSIS — L97319 Non-pressure chronic ulcer of right ankle with unspecified severity: Principal | ICD-10-CM

## 2015-11-05 MED ORDER — DOXYCYCLINE HYCLATE 100 MG PO TABS: 100.0000 mg | ORAL_TABLET | Freq: Two times a day (BID) | ORAL | 0 refills | Status: DC

## 2015-11-05 NOTE — Progress Notes (Signed)
   Subjective:    Patient ID: Jody [name], female    DOB: 06/26/1927, 82 y.o.   MRN: [id number]  HPI PT presents to the office today to recheck right lower leg cellulitis. PT was given Rocephin IM on 10/26/15 and started on bactrim. PT then was seen in the clinic on 10/30/15 and she was tolerated bactrim and her antibiotic was switched to Keflex 500 mg  QID. PT states her leg is the same. PT states she has constant pain 10 out 10. PT states she has "yellow discharge" from her lateral ankle. PT states the redness extends from her ankle to her calf and lower leg. Pt is on her last day of keflex today.    Review of Systems  Cardiovascular: Positive for leg swelling.  All other systems reviewed and are negative.      Objective:   Physical Exam  Constitutional: She is oriented to person, place, and time. She appears well-developed and well-nourished.  Neck: Normal range of motion. Neck supple.  Cardiovascular:  Murmur heard. Pulmonary/Chest: Effort normal and breath sounds normal.  Musculoskeletal: She exhibits edema (2+ in RLE) and tenderness.  Neurological: She is alert and oriented to person, place, and time.  Skin: There is erythema.  Erythemas lateral right ankle that extends to calf and entire lower leg. Venous ulcer present on lateral ankle with yellow discharge  Psychiatric: She has a normal mood and affect. Her behavior is normal. Judgment and thought content normal.    BP 134/68   Pulse 87   Temp 98.1 F (36.7 C) (Oral)   Ht 5\' 2"  (1.575 m)   Wt 118 lb 12.8 oz (53.9 kg)   BMI 21.73 kg/m   Area cleaned and antibiotic ointment applied. Gauze and ace wrapped      Assessment & Plan:  1. Venous stasis ulcer of ankle, right (HCC) - doxycycline (VIBRA-TABS) 100 MG tablet; Take 1 tablet (100 mg total) by mouth 2 (two) times daily.  Dispense: 20 tablet; Refill: 0  2. Cellulitis of leg, right - doxycycline (VIBRA-TABS) 100 MG tablet; Take 1 tablet (100 mg total) by mouth 2  (two) times daily.  Dispense: 20 tablet; Refill: 0  Keep clean and dry Keep elevated, avoid being on leg Compression hose RTO in 1 week to recheck, if  Erythemas, warmth, or drainage becomes worse RTO sooner  Evelina Dun, FNP

## 2015-11-05 NOTE — Patient Instructions (Signed)
Venous Ulcer A venous ulcer is a shallow sore on your lower leg that is caused by poor circulation in your veins. This condition used to be called stasis ulcer.  Veins have valves that help return blood to the heart. If these valves do not work properly, it can cause blood to flow backward and to back up into the veins near the skin. When that happens, blood can pool in your lower legs. The blood can then leak out of your veins, which can irritate your skin. This may cause a break in your skin that becomes a venous ulcer. Venous ulcer is the most common type of lower leg ulcer. You may have venous ulcers on one leg or on both legs. The area where this condition most commonly develops is around the ankles. A venous ulcer may last for a long time (chronic ulcer) or it may return repeatedly (recurrent ulcer). CAUSES Any condition that causes poor circulation to your legs can lead to a venous ulcer.  RISK FACTORS This condition is more likely to develop in:  People who are 65 years of age or older.  People who are overweight.  People who are not active.  People who have had a leg ulcer in the past.  People who have clots in their lower leg veins (deep vein thrombosis).  People who have inflammation of their leg veins (phlebitis).  Women who have given birth.  People who smoke. SYMPTOMS  The most common symptom of this condition is an open sore near your ankle. Other symptoms may include:  Swelling.  Thickening of the skin.  Fluid leaking from the ulcer.  Bleeding.  Itching.  Pain and swelling that gets worse when you stand up and feels better when you raise your leg.  Blotchy skin.  Darkening of the skin. DIAGNOSIS  Your health care provider may suspect a venous ulcer based on your medical history and your risk factors. Your health care provider will check the skin on your legs. Other tests may be done to learn more about the ulcer and to determine the best way to treat it.  Tests that may be done include:  Measuring the blood pressure in your arms and legs.  Using sound waves (ultrasound) to measure the blood flow in your leg veins. TREATMENT You may need to try several different types of treatment to get your venous ulcer to heal. Healing may take a long time. Treatment may include:  Keeping your leg raised (elevated).  Wearing a type of bandage or stocking to compress the veins of your leg (compression therapy). Venous wounds are not likely to heal or to stay healed without compression.  Taking medicines to improve blood flow.  Taking antibiotic medicines to treat infection.  Cleaning your ulcer and removing any dead tissue from the wound (debridement).  Placing various types of medicated bandage (dressings) or medicated wraps on your ulcer. This helps the ulcer to heal and helps to prevent infection. Surgery is sometimes needed to close the wound using a piece of skin taken from another area of your body (graft). You may need surgery if other treatments are not working or if your ulcer is very deep. HOME CARE INSTRUCTIONS Wound Care  Follow instructions from your health care provider about:  How to take care of your wound.  When and how you should change your bandage (dressing).  When you should remove your dressing. If your dressing is dry and sticks to your leg when you try to remove   it, moisten or wet the dressing with saline solution or water so that the dressing can be removed without harming your skin or wound tissue.  Check your wound every day for signs of infection. Have a caregiver do this for you if you are not able to do it yourself. Check for:  More redness, swelling, or pain. More fluid or blood.  Pus, warmth, or a bad smell. Medicines   Take over-the-counter and prescription medicines only as told by your health care provider.  If you were prescribed an antibiotic medicine, take it or apply it as told by your health care  provider. Do not stop taking or using the antibiotic even if your condition improves. Activity  Do not stand or sit in one position for a long period of time. Rest with your legs raised during the day. If possible, keep your legs above your heart for 30 minutes, 3-4 times a day, or as told by your health care provider.  Do not sit with your legs crossed.   Walk often to increase the blood flow in your legs.Ask your health care provider what level of activity is safe for you.  If you are taking a long ride in a car or plane, take a break to walk around at least once every two hours, or as often as your health care provider recommends. Ask your health care provider if you should take aspirin before long trips.  General Instructions  Wear elastic stockings, compression stockings, or support hose as told by your health care provider. This is very important.  Raise the foot of your bed as told by your health care provider.  Do not smoke.  Keep all follow-up visits as told by your health care provider. This is important. SEEK MEDICAL CARE IF:   You have a fever.   Your ulcer is getting larger or is not healing.   Your pain gets worse.   You have more redness or swelling around your ulcer.  You have more fluid, blood, or pus coming from your ulcer after it has been cleaned by you or your health care provider.  You have warmth or a bad smell coming from your ulcer.   This information is not intended to replace advice given to you by your health care provider. Make sure you discuss any questions you have with your health care provider.   Document Released: 12/14/2000 Document Revised: 12/10/2014 Document Reviewed: 07/30/2014 Elsevier Interactive Patient Education 2016 [employer]  

## 2015-11-10 ENCOUNTER — Ambulatory Visit (INDEPENDENT_AMBULATORY_CARE_PROVIDER_SITE_OTHER): Payer: Medicare Other | Admitting: Family

## 2015-11-10 ENCOUNTER — Encounter: Payer: Self-pay | Admitting: Family

## 2015-11-10 VITALS — BP 146/80 | HR 82 | Temp 97.5°F | Ht 62.0 in | Wt 116.0 lb

## 2015-11-10 DIAGNOSIS — L03115 Cellulitis of right lower limb: Secondary | ICD-10-CM

## 2015-11-10 DIAGNOSIS — I83013 Varicose veins of right lower extremity with ulcer of ankle: Secondary | ICD-10-CM

## 2015-11-10 DIAGNOSIS — L97319 Non-pressure chronic ulcer of right ankle with unspecified severity: Secondary | ICD-10-CM

## 2015-11-10 MED ORDER — MUPIROCIN 2 % EX OINT: 1.0000 "application " | TOPICAL_OINTMENT | Freq: Two times a day (BID) | CUTANEOUS | 0 refills | Status: DC

## 2015-11-10 NOTE — Progress Notes (Addendum)
   Subjective:    Patient ID: Jody [name], female    DOB: [birth year], 82 y.o.   MRN: [id number]  HPI  PT presents to the office today to recheck right lower leg cellulitis. PT was given Rocephin IM on 10/26/15 and started on bactrim. PT then was seen in the clinic on 10/30/15 and states she could not tolerated bactrim and her antibiotic was switched to Keflex 500 mg  QID. Then pt was seen in the clinic with increase redness and purulent drainage. PT was then started on doxycyline 100 mg BID for 10 days.PT's leg has improved. Redness is decreased and ulcer size is decreased. The redness had been up pt's leg to her knee and is now contained around her ankle. PT states she continues to have constant pain 5 out 10 (improved from 10 out 10).    Review of Systems  Constitutional: Negative.   HENT: Negative.   Eyes: Negative.   Respiratory: Negative.  Negative for apnea and shortness of breath.   Cardiovascular: Positive for leg swelling. Negative for chest pain and palpitations.  Gastrointestinal: Negative.  Negative for abdominal distention and blood in stool.  Endocrine: Negative.   Genitourinary: Negative.   Musculoskeletal: Negative.   Skin: Positive for wound.  Neurological: Negative.  Negative for headaches.  Hematological: Negative.   Psychiatric/Behavioral: Negative.   All other systems reviewed and are negative.      Objective:   Physical Exam  Constitutional: She is oriented to person, place, and time. She appears well-developed and well-nourished.  Neck: Normal range of motion. Neck supple.  Cardiovascular:  Murmur heard. Pulmonary/Chest: Effort normal and breath sounds normal.  Musculoskeletal: She exhibits edema (trace in RLE) and tenderness.  Neurological: She is alert and oriented to person, place, and time.  Skin: There is erythema.  Erythemas lateral right ankle that extends to ankle approm 12cm X 15 cm. Venous ulcer present on lateral ankle with yellow discharge    Psychiatric: She has a normal mood and affect. Her behavior is normal. Judgment and thought content normal.    BP (!) 146/80   Pulse 82   Temp 97.5 F (36.4 C) (Oral)   Ht 5\' 2"  (1.575 m)   Wt 116 lb (52.6 kg)   BMI 21.22 kg/m   Area cleaned and antibiotic ointment applied. Telfa applied and ace wrapped      Assessment & Plan:  1. Cellulitis of leg, right - mupirocin ointment (BACTROBAN) 2 %; Apply 1 application topically 2 (two) times daily.  Dispense: 22 g; Refill: 0  2. Venous stasis ulcer of ankle, right ([facility]) - mupirocin ointment (BACTROBAN) 2 %; Apply 1 application topically 2 (two) times daily.  Dispense: 22 g; Refill: 0  Continue doxycyline Keep clean and dry Keep elevated, avoid being on leg Compression hose RTO on Monday to recheck, if  Erythemas, warmth, or drainage becomes worse RTO sooner  Evelina Dun, FNP

## 2015-11-10 NOTE — Patient Instructions (Signed)
Venous Ulcer A venous ulcer is a shallow sore on your lower leg that is caused by poor circulation in your veins. This condition used to be called stasis ulcer.  Veins have valves that help return blood to the heart. If these valves do not work properly, it can cause blood to flow backward and to back up into the veins near the skin. When that happens, blood can pool in your lower legs. The blood can then leak out of your veins, which can irritate your skin. This may cause a break in your skin that becomes a venous ulcer. Venous ulcer is the most common type of lower leg ulcer. You may have venous ulcers on one leg or on both legs. The area where this condition most commonly develops is around the ankles. A venous ulcer may last for a long time (chronic ulcer) or it may return repeatedly (recurrent ulcer). CAUSES Any condition that causes poor circulation to your legs can lead to a venous ulcer.  RISK FACTORS This condition is more likely to develop in:  People who are 65 years of age or older.  People who are overweight.  People who are not active.  People who have had a leg ulcer in the past.  People who have clots in their lower leg veins (deep vein thrombosis).  People who have inflammation of their leg veins (phlebitis).  Women who have given birth.  People who smoke. SYMPTOMS  The most common symptom of this condition is an open sore near your ankle. Other symptoms may include:  Swelling.  Thickening of the skin.  Fluid leaking from the ulcer.  Bleeding.  Itching.  Pain and swelling that gets worse when you stand up and feels better when you raise your leg.  Blotchy skin.  Darkening of the skin. DIAGNOSIS  Your health care provider may suspect a venous ulcer based on your medical history and your risk factors. Your health care provider will check the skin on your legs. Other tests may be done to learn more about the ulcer and to determine the best way to treat it.  Tests that may be done include:  Measuring the blood pressure in your arms and legs.  Using sound waves (ultrasound) to measure the blood flow in your leg veins. TREATMENT You may need to try several different types of treatment to get your venous ulcer to heal. Healing may take a long time. Treatment may include:  Keeping your leg raised (elevated).  Wearing a type of bandage or stocking to compress the veins of your leg (compression therapy). Venous wounds are not likely to heal or to stay healed without compression.  Taking medicines to improve blood flow.  Taking antibiotic medicines to treat infection.  Cleaning your ulcer and removing any dead tissue from the wound (debridement).  Placing various types of medicated bandage (dressings) or medicated wraps on your ulcer. This helps the ulcer to heal and helps to prevent infection. Surgery is sometimes needed to close the wound using a piece of skin taken from another area of your body (graft). You may need surgery if other treatments are not working or if your ulcer is very deep. HOME CARE INSTRUCTIONS Wound Care  Follow instructions from your health care provider about:  How to take care of your wound.  When and how you should change your bandage (dressing).  When you should remove your dressing. If your dressing is dry and sticks to your leg when you try to remove   it, moisten or wet the dressing with saline solution or water so that the dressing can be removed without harming your skin or wound tissue.  Check your wound every day for signs of infection. Have a caregiver do this for you if you are not able to do it yourself. Check for:  More redness, swelling, or pain. More fluid or blood.  Pus, warmth, or a bad smell. Medicines   Take over-the-counter and prescription medicines only as told by your health care provider.  If you were prescribed an antibiotic medicine, take it or apply it as told by your health care  provider. Do not stop taking or using the antibiotic even if your condition improves. Activity  Do not stand or sit in one position for a long period of time. Rest with your legs raised during the day. If possible, keep your legs above your heart for 30 minutes, 3-4 times a day, or as told by your health care provider.  Do not sit with your legs crossed.   Walk often to increase the blood flow in your legs.Ask your health care provider what level of activity is safe for you.  If you are taking a long ride in a car or plane, take a break to walk around at least once every two hours, or as often as your health care provider recommends. Ask your health care provider if you should take aspirin before long trips.  General Instructions  Wear elastic stockings, compression stockings, or support hose as told by your health care provider. This is very important.  Raise the foot of your bed as told by your health care provider.  Do not smoke.  Keep all follow-up visits as told by your health care provider. This is important. SEEK MEDICAL CARE IF:   You have a fever.   Your ulcer is getting larger or is not healing.   Your pain gets worse.   You have more redness or swelling around your ulcer.  You have more fluid, blood, or pus coming from your ulcer after it has been cleaned by you or your health care provider.  You have warmth or a bad smell coming from your ulcer.   This information is not intended to replace advice given to you by your health care provider. Make sure you discuss any questions you have with your health care provider.   Document Released: 12/14/2000 Document Revised: 12/10/2014 Document Reviewed: 07/30/2014 Elsevier Interactive Patient Education 2016 [employer]  

## 2015-11-19 ENCOUNTER — Ambulatory Visit (INDEPENDENT_AMBULATORY_CARE_PROVIDER_SITE_OTHER): Payer: Medicare Other | Admitting: Family

## 2015-11-19 ENCOUNTER — Encounter: Payer: Self-pay | Admitting: Family

## 2015-11-19 VITALS — BP 127/67 | HR 81 | Temp 97.3°F | Ht 62.0 in | Wt 118.6 lb

## 2015-11-19 DIAGNOSIS — Z5189 Encounter for other specified aftercare: Secondary | ICD-10-CM | POA: Diagnosis not present

## 2015-11-19 DIAGNOSIS — L97319 Non-pressure chronic ulcer of right ankle with unspecified severity: Secondary | ICD-10-CM

## 2015-11-19 DIAGNOSIS — L03115 Cellulitis of right lower limb: Secondary | ICD-10-CM

## 2015-11-19 DIAGNOSIS — I83013 Varicose veins of right lower extremity with ulcer of ankle: Secondary | ICD-10-CM | POA: Diagnosis not present

## 2015-11-19 MED ORDER — MUPIROCIN 2 % EX OINT: 1.0000 "application " | TOPICAL_OINTMENT | Freq: Two times a day (BID) | CUTANEOUS | 1 refills | Status: DC

## 2015-11-19 NOTE — Progress Notes (Signed)
   Subjective:    Patient ID: Jody [name], female    DOB: 1927/08/05, 82 y.o.   MRN: [id number]  Wound Check  She was originally treated more than 14 days ago. Previous treatment included oral antibiotics. Her temperature was unmeasured prior to arrival. There has been no drainage from the wound. The redness has improved. The swelling has improved. The pain has improved. There is difficulty moving the extremity or digit due to pain.   PT presents to the office today to recheck right lower leg cellulitis. PT was given Rocephin IM on 10/26/15 and started on bactrim. PT then was seen in the clinic on 10/30/15 and states she could not tolerated bactrim and her antibiotic was switched to Keflex 500 mg  QID. Then pt was seen in the clinic with increase redness and purulent drainage. PT was then started on doxycyline 100 mg BID for 10 days. Pt completed the doxycyline on 11/15/15. PT's leg has greatly improved. Redness is decreased and ulcer is dried and scabbed and no drainage present.  PT states she continues to have constant pain 4 out 10 (improved from 10 out 10).    Review of Systems  Constitutional: Negative.   HENT: Negative.   Eyes: Negative.   Respiratory: Negative.  Negative for apnea and shortness of breath.   Cardiovascular: Positive for leg swelling. Negative for chest pain and palpitations.  Gastrointestinal: Negative.  Negative for abdominal distention and blood in stool.  Endocrine: Negative.   Genitourinary: Negative.   Musculoskeletal: Negative.   Skin: Positive for wound.  Neurological: Negative.  Negative for headaches.  Hematological: Negative.   Psychiatric/Behavioral: Negative.   All other systems reviewed and are negative.      Objective:   Physical Exam  Constitutional: She is oriented to person, place, and time. She appears well-developed and well-nourished.  Neck: Normal range of motion. Neck supple.  Cardiovascular:  Murmur heard. Pulmonary/Chest: Effort  normal and breath sounds normal.  Musculoskeletal: She exhibits edema (trace in RLE) and tenderness.  2+ swelling in right foot, and ankle  Neurological: She is alert and oriented to person, place, and time.  Skin: There is erythema.  Erythemas around right ankle with healing Venous ulcer present on lateral ankle. No discharge present. Slightly warm.   Psychiatric: She has a normal mood and affect. Her behavior is normal. Judgment and thought content normal.    BP 127/67   Pulse 81   Temp 97.3 F (36.3 C) (Oral)   Ht 5\' 2"  (1.575 m)   Wt 118 lb 9.6 oz (53.8 kg)   BMI 21.69 kg/m   Area cleaned and antibiotic ointment applied. Telfa applied and ace wrapped      Assessment & Plan:  1. Cellulitis of leg, right - mupirocin ointment (BACTROBAN) 2 %; Apply 1 application topically 2 (two) times daily.  Dispense: 60 g; Refill: 1  2. Venous stasis ulcer of ankle, right ([facility]CC) - mupirocin ointment (BACTROBAN) 2 %; Apply 1 application topically 2 (two) times daily.  Dispense: 60 g; Refill: 1  3. Encounter for wound re-check   Keep clean and dry Keep elevated, avoid being on leg Compression hose RTO in 2 weeks  to recheck, if  Erythemas, warmth, or drainage becomes worse RTO sooner  Evelina Dun, FNP

## 2015-11-19 NOTE — Patient Instructions (Signed)
Venous Ulcer A venous ulcer is a shallow sore on your lower leg that is caused by poor circulation in your veins. This condition used to be called stasis ulcer.  Veins have valves that help return blood to the heart. If these valves do not work properly, it can cause blood to flow backward and to back up into the veins near the skin. When that happens, blood can pool in your lower legs. The blood can then leak out of your veins, which can irritate your skin. This may cause a break in your skin that becomes a venous ulcer. Venous ulcer is the most common type of lower leg ulcer. You may have venous ulcers on one leg or on both legs. The area where this condition most commonly develops is around the ankles. A venous ulcer may last for a long time (chronic ulcer) or it may return repeatedly (recurrent ulcer). CAUSES Any condition that causes poor circulation to your legs can lead to a venous ulcer.  RISK FACTORS This condition is more likely to develop in:  People who are 65 years of age or older.  People who are overweight.  People who are not active.  People who have had a leg ulcer in the past.  People who have clots in their lower leg veins (deep vein thrombosis).  People who have inflammation of their leg veins (phlebitis).  Women who have given birth.  People who smoke. SYMPTOMS  The most common symptom of this condition is an open sore near your ankle. Other symptoms may include:  Swelling.  Thickening of the skin.  Fluid leaking from the ulcer.  Bleeding.  Itching.  Pain and swelling that gets worse when you stand up and feels better when you raise your leg.  Blotchy skin.  Darkening of the skin. DIAGNOSIS  Your health care provider may suspect a venous ulcer based on your medical history and your risk factors. Your health care provider will check the skin on your legs. Other tests may be done to learn more about the ulcer and to determine the best way to treat it.  Tests that may be done include:  Measuring the blood pressure in your arms and legs.  Using sound waves (ultrasound) to measure the blood flow in your leg veins. TREATMENT You may need to try several different types of treatment to get your venous ulcer to heal. Healing may take a long time. Treatment may include:  Keeping your leg raised (elevated).  Wearing a type of bandage or stocking to compress the veins of your leg (compression therapy). Venous wounds are not likely to heal or to stay healed without compression.  Taking medicines to improve blood flow.  Taking antibiotic medicines to treat infection.  Cleaning your ulcer and removing any dead tissue from the wound (debridement).  Placing various types of medicated bandage (dressings) or medicated wraps on your ulcer. This helps the ulcer to heal and helps to prevent infection. Surgery is sometimes needed to close the wound using a piece of skin taken from another area of your body (graft). You may need surgery if other treatments are not working or if your ulcer is very deep. HOME CARE INSTRUCTIONS Wound Care  Follow instructions from your health care provider about:  How to take care of your wound.  When and how you should change your bandage (dressing).  When you should remove your dressing. If your dressing is dry and sticks to your leg when you try to remove   it, moisten or wet the dressing with saline solution or water so that the dressing can be removed without harming your skin or wound tissue.  Check your wound every day for signs of infection. Have a caregiver do this for you if you are not able to do it yourself. Check for:  More redness, swelling, or pain. More fluid or blood.  Pus, warmth, or a bad smell. Medicines   Take over-the-counter and prescription medicines only as told by your health care provider.  If you were prescribed an antibiotic medicine, take it or apply it as told by your health care  provider. Do not stop taking or using the antibiotic even if your condition improves. Activity  Do not stand or sit in one position for a long period of time. Rest with your legs raised during the day. If possible, keep your legs above your heart for 30 minutes, 3-4 times a day, or as told by your health care provider.  Do not sit with your legs crossed.   Walk often to increase the blood flow in your legs.Ask your health care provider what level of activity is safe for you.  If you are taking a long ride in a car or plane, take a break to walk around at least once every two hours, or as often as your health care provider recommends. Ask your health care provider if you should take aspirin before long trips.  General Instructions  Wear elastic stockings, compression stockings, or support hose as told by your health care provider. This is very important.  Raise the foot of your bed as told by your health care provider.  Do not smoke.  Keep all follow-up visits as told by your health care provider. This is important. SEEK MEDICAL CARE IF:   You have a fever.   Your ulcer is getting larger or is not healing.   Your pain gets worse.   You have more redness or swelling around your ulcer.  You have more fluid, blood, or pus coming from your ulcer after it has been cleaned by you or your health care provider.  You have warmth or a bad smell coming from your ulcer.   This information is not intended to replace advice given to you by your health care provider. Make sure you discuss any questions you have with your health care provider.   Document Released: 12/14/2000 Document Revised: 12/10/2014 Document Reviewed: 07/30/2014 Elsevier Interactive Patient Education 2016 [employer]  

## 2015-11-27 ENCOUNTER — Telehealth: Payer: Self-pay | Admitting: Family

## 2015-11-27 DIAGNOSIS — L989 Disorder of the skin and subcutaneous tissue, unspecified: Secondary | ICD-10-CM

## 2015-11-30 ENCOUNTER — Ambulatory Visit: Payer: Medicare Other | Admitting: Family

## 2015-11-30 NOTE — Telephone Encounter (Signed)
Referral to derm placed.

## 2015-12-02 ENCOUNTER — Encounter: Payer: Self-pay | Admitting: Family

## 2015-12-02 ENCOUNTER — Ambulatory Visit (INDEPENDENT_AMBULATORY_CARE_PROVIDER_SITE_OTHER): Payer: Medicare Other | Admitting: Family

## 2015-12-02 VITALS — BP 127/59 | HR 69 | Temp 97.2°F | Ht 62.0 in | Wt 116.6 lb

## 2015-12-02 DIAGNOSIS — I83019 Varicose veins of right lower extremity with ulcer of unspecified site: Secondary | ICD-10-CM | POA: Diagnosis not present

## 2015-12-02 DIAGNOSIS — IMO0001 Reserved for inherently not codable concepts without codable children: Secondary | ICD-10-CM

## 2015-12-02 DIAGNOSIS — L03115 Cellulitis of right lower limb: Secondary | ICD-10-CM | POA: Diagnosis not present

## 2015-12-02 DIAGNOSIS — Z5189 Encounter for other specified aftercare: Secondary | ICD-10-CM

## 2015-12-02 MED ORDER — TRAMADOL HCL 50 MG PO TABS: 50.0000 mg | ORAL_TABLET | Freq: Three times a day (TID) | ORAL | 0 refills | Status: DC | PRN

## 2015-12-02 MED ORDER — CEFTRIAXONE SODIUM 1 G IJ SOLR
1.0000 g | Freq: Once | INTRAMUSCULAR | Status: AC
  Administered 2015-12-02: 1 g via INTRAMUSCULAR

## 2015-12-02 MED ORDER — DOXYCYCLINE HYCLATE 100 MG PO TABS: 100.0000 mg | ORAL_TABLET | Freq: Two times a day (BID) | ORAL | 0 refills | Status: DC

## 2015-12-02 NOTE — Patient Instructions (Signed)

## 2015-12-02 NOTE — Progress Notes (Signed)
Subjective:    Patient ID: Jody [name], female    DOB: 02/10/1928, 82 y.o.   MRN: [id number]  PT presents to the office today to recheck right lower leg cellulitis. PT was given Rocephin IM on 10/26/15 and started on bactrim. PT then was seen in the clinic on 10/30/15 and states she could not tolerated bactrim and her antibiotic was switched to Keflex 500 mg  QID. Then pt was seen in the clinic with increase redness and purulent drainage. PT was then started on doxycyline 100 mg BID for 10 days. Pt completed the doxycyline on 11/15/15. The wound was improving, but today seems to be worse. Ulcer is larger, redess, swelling and pain is increased from 2 weeks ago. Daughter dresses wound BID and applies bactran with no relief. PT states the pain is constant a 10 out 10.  Wound Check  She was originally treated more than 14 days ago. Previous treatment included oral antibiotics. Her temperature was unmeasured prior to arrival. There has been clear discharge from the wound. The redness has worsened. The swelling has worsened. The pain has worsened. There is difficulty moving the extremity or digit due to pain.      Review of Systems  Constitutional: Negative.   HENT: Negative.   Eyes: Negative.   Respiratory: Negative.  Negative for shortness of breath.   Cardiovascular: Negative.  Negative for palpitations.  Gastrointestinal: Negative.   Endocrine: Negative.   Genitourinary: Negative.   Musculoskeletal: Negative.   Skin: Positive for wound.  Neurological: Negative.  Negative for headaches.  Hematological: Negative.   Psychiatric/Behavioral: Negative.   All other systems reviewed and are negative.      Objective:   Physical Exam  Constitutional: She is oriented to person, place, and time. She appears well-developed and well-nourished. No distress.  HENT:  Head: Normocephalic.  Eyes: Pupils are equal, round, and reactive to light.  Neck: Normal range of motion. Neck supple. No  thyromegaly present.  Cardiovascular: Normal rate, regular rhythm, normal heart sounds and intact distal pulses.   No murmur heard. Pulmonary/Chest: Effort normal and breath sounds normal. No respiratory distress. She has no wheezes.  Abdominal: Soft. Bowel sounds are normal. She exhibits no distension. There is no tenderness.  Musculoskeletal: She exhibits edema and tenderness.  Neurological: She is alert and oriented to person, place, and time.  Skin: Skin is warm and dry.  Erythemas around right ankle with  Venous ulcer present on lateral ankle extending into medial ankle. Sloughing and serous discharge present  Psychiatric: She has a normal mood and affect. Her behavior is normal. Judgment and thought content normal.  Vitals reviewed.     BP (!) 127/59   Pulse 69   Temp 97.2 F (36.2 C) (Oral)   Ht 5\' 2"  (1.575 m)   Wt 116 lb 9.6 oz (52.9 kg)   BMI 21.33 kg/m      Assessment & Plan:  1. Cellulitis of leg, right - Ambulatory referral to Wound Clinic - doxycycline (VIBRA-TABS) 100 MG tablet; Take 1 tablet (100 mg total) by mouth 2 (two) times daily.  Dispense: 20 tablet; Refill: 0 - cefTRIAXone (ROCEPHIN) injection 1 g; Inject 1 g into the muscle once. - traMADol (ULTRAM) 50 MG tablet; Take 1-2 tablets (50-100 mg total) by mouth every 8 (eight) hours as needed.  Dispense: 60 tablet; Refill: 0 - Ambulatory referral to Home Health  2. Encounter for wound re-check - Ambulatory referral to Wound Clinic - doxycycline (VIBRA-TABS) 100 MG  tablet; Take 1 tablet (100 mg total) by mouth 2 (two) times daily.  Dispense: 20 tablet; Refill: 0 - cefTRIAXone (ROCEPHIN) injection 1 g; Inject 1 g into the muscle once. - Ambulatory referral to Home Health  3. Venous stasis ulcer, right ([place]) - Ambulatory referral to Wound Clinic - doxycycline (VIBRA-TABS) 100 MG tablet; Take 1 tablet (100 mg total) by mouth 2 (two) times daily.  Dispense: 20 tablet; Refill: 0 - cefTRIAXone (ROCEPHIN)  injection 1 g; Inject 1 g into the muscle once. - traMADol (ULTRAM) 50 MG tablet; Take 1-2 tablets (50-100 mg total) by mouth every 8 (eight) hours as needed.  Dispense: 60 tablet; Refill: 0 - Ambulatory referral to [place] [facility] today. Home health ordered and stat wound referral placed Started on doxycyline today RX of ultram given today RTO on Friday if [place] has not be set up yet  Evelina Dun, FNP

## 2015-12-03 ENCOUNTER — Telehealth: Payer: Self-pay | Admitting: Family

## 2015-12-03 ENCOUNTER — Telehealth (HOSPITAL_COMMUNITY): Payer: Self-pay | Admitting: Physical Therapy

## 2015-12-03 ENCOUNTER — Ambulatory Visit (HOSPITAL_COMMUNITY): Payer: Medicare Other | Attending: Family | Admitting: Physical Therapy

## 2015-12-03 DIAGNOSIS — I83013 Varicose veins of right lower extremity with ulcer of ankle: Secondary | ICD-10-CM | POA: Insufficient documentation

## 2015-12-03 DIAGNOSIS — R262 Difficulty in walking, not elsewhere classified: Secondary | ICD-10-CM | POA: Insufficient documentation

## 2015-12-03 DIAGNOSIS — M25571 Pain in right ankle and joints of right foot: Secondary | ICD-10-CM | POA: Diagnosis not present

## 2015-12-03 DIAGNOSIS — L97319 Non-pressure chronic ulcer of right ankle with unspecified severity: Secondary | ICD-10-CM

## 2015-12-03 NOTE — Therapy (Signed)
[place], [place], [address] Phone: [phone]   Fax:  [phone]  Wound Care Evaluation  Patient Details  Name: Jody [name] MRN: [id number] Date of Birth: 08/31/1927 Referring Provider: Sharion [name]   Encounter Date: 12/03/2015      PT End of Session - 12/03/15 1058    Visit Number 1   Number of Visits 12   Date for PT Re-Evaluation 01/02/16   Authorization Type U[employer] medicare   Authorization - Visit Number 1   Authorization - Number of Visits 10   PT Start Time [phone]   PT Stop Time 1027   PT Time Calculation (min) 37 min   Activity Tolerance Patient limited by pain   Behavior During Therapy [facility] for tasks assessed/performed      Past Medical History:  Diagnosis Date  . Afib ([place])   . Anemia   . Chronic diastolic heart failure ([place])   . HTN (hypertension)   . Mitral regurgitation    Moderate, echo, 07/2012  . Pulmonary hypertension ([place])     Past Surgical History:  Procedure Laterality Date  . JOINT REPLACEMENT Right Feb 2015   Dr. Case- [name]    There were no vitals filed for this visit.        [facility] PT Assessment - 12/03/15 0001      Assessment   Medical Diagnosis venous stasis ulcer   Referring Provider Jody [name]    Onset Date/Surgical Date 10/22/15   Next MD Visit unknown   Prior Therapy treatment: 2 bouts of antibiotics and an antibiotic shot.      Precautions   Precautions None     Restrictions   Weight Bearing Restrictions No     Balance Screen   Has the patient fallen in the past 6 months No   Has the patient had a decrease in activity level because of a fear of falling?  Yes  due to pain in LE    Is the patient reluctant to leave their home because of a fear of falling?  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  It began as a small area and has progressively gotten worse.  She was placed on antibiotics and it seemed to be getting better but as soon as she stopped the antibiotics the wound continued to worsen.  She went back to the MD    Patient and Family Stated Goals For the wound to heal    Date of Onset 10/22/15   Prior Treatments self care and antibiotics    Pain Assessment 0-10   Pain Score 9    Pain Type Acute pain   Pain Location Ankle   Pain Orientation Right   Pain Descriptors / Indicators Burning   Pain Onset Other (Comment)  change of sensation, ie taking dressing off, cleansing...   Patients Stated Pain Goal 0   Pain Intervention(s) Emotional support   Evaluation and Treatment Procedures Explained to Patient/Family Yes  Evaluation and Treatment Procedures agreed to   Wound Properties Date First Assessed: 12/03/15 Time First Assessed: 0951 Wound Type: Other (Comment) Location: Ankle Location Orientation: Right Wound Description (Comments): lateral aspect Present on Admission: Yes   Dressing Type Gauze (Comment)   Dressing Changed Changed   Dressing Status Old drainage   Dressing Change Frequency PRN   Site / Wound Assessment Friable;Painful;Yellow   % Wound base Red or Granulating 0%   % Wound base Yellow 100%   Peri-wound Assessment Edema   Wound Length (cm) 8 cm   Wound Width (cm) 7 cm   Wound Depth (cm) 0.2 cm   Drainage Amount Moderate   Drainage Description Purulent   Treatment Cleansed  compression    Wound Properties Date First Assessed: 12/03/15 Wound Type: Other (Comment) Location: Ankle Location Orientation: Right Wound Description (Comments): medial aspect  Present on Admission: Yes Final Assessment Date:  12/03/15   Dressing Type Gauze (Comment)   Dressing Changed Changed   Dressing Status Old drainage   Dressing Change Frequency PRN   Site / Wound Assessment Friable;Pale   % Wound base Red or Granulating 20%   % Wound base Yellow 80%   Peri-wound Assessment Edema   Wound Length (cm) 4 cm   Wound Width (cm) 3 cm   Wound Depth (cm) 0.1 cm   Drainage Amount Moderate   Drainage Description Purulent   Treatment Cleansed;Other (Comment)   Wound Therapy - Clinical Statement [name] is an 82 yo female that has varicosities in her LE.  She states that a small area appeared on her ankle approximately 6 weeks ago and has been progressing in size and pain ever since.  She is having increased pain which is causing increased pain with walking.  She currently still works full time at [employer].  The therapist recommended that the patient takes a week off in order to elevate her leg as much as possible.  The wound is draining significantly; and her LE has increased edema.    [name] will benefit from skilled physical therapy to create a wound environment that is condusive to healing.    Wound Therapy - Functional Problem List pain limiting walking,    Factors Delaying/Impairing Wound Healing Infection - systemic/local;Vascular compromise   Hydrotherapy Plan Debridement;Dressing change;Patient/family education   Wound Therapy - Frequency --  2x week x 6 weeks.    Wound Therapy - Current Recommendations PT   Wound Plan Pt education, wound care inclucding cleansing, debridement and multilayer dressing changes.    Dressing  Silverhydrofiber to wounds, vaseline to periphery, profore lite; if pt tolerates well may try profore.                          PT Education - 12/03/15 1057    Education provided Yes   Education Details Explained to keep dressing dry.  If dressing causes increased pain take the first layer,(coban), off.  Then progressively take layers off until pain subsides     Person(s) Educated Patient;Child(ren)   Methods Explanation   Comprehension Verbalized understanding          PT Short Term Goals - 12/03/15 1102      PT SHORT TERM GOAL #1   Title Pt pain in her right ankle to be no greater than a 5/10 to allow pt to be on her feet for more than 30 minutes.   Time 3   Period Weeks   Status New  PT SHORT TERM GOAL #2   Title Pt drainage from wounds on right ankle to be scant to decrease risk of infection    Time 3   Period Weeks   Status New     PT SHORT TERM GOAL #3   Title Pt wound size to have decreased by 3 cm x 3 cm to decrease risk of infection    Time 3   Period Weeks           PT Long Term Goals - 2015/12/22 1109      PT LONG TERM GOAL #1   Title Pt pain level to be no greater than a 2/10 to allow pt to be able to be on her feet for up to 40 minutes for work tasks.    Time 6   Period Weeks   Status New     PT LONG TERM GOAL #2   Title Pt wounds to be healed    Time 6   Period Weeks   Status New     PT LONG TERM GOAL #3   Title Pt to be wearing compression garments everyday and to verbalize the importance of why you need to wear compression when you have varicosities.     Time 6   Period Weeks              Plan - 22-Dec-2015 1100    Clinical Impression Statement see above.     Rehab Potential Good   PT Frequency 2x / week   PT Duration 6 weeks   PT Treatment/Interventions ADLs/Self Care Home Management;Patient/family education;Other (comment)   PT Next Visit Plan Cleanse wound.  Debride if able, this is very painful for patient at this time.  Assess how pt tolerated profore lite.  If pt tolerated this well may progress to profore.    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 Preoperative clearance 06/23/2014  . (HFpEF) heart failure with preserved ejection fraction ([place]) 04/16/2013  . Mitral regurgitation   . Anemia   . HTN (hypertension)   . Atrial fibrillation ([place]) 04/02/2009  . DIASTOLIC HEART FAILURE, CHRONIC 04/02/2009    Rayetta Humphrey, PT CLT [phone] 2015-12-22, 11:15 AM  [place] [address] [place], [place], [address] Phone: [phone]   Fax:  [phone]  Name: Jody [name] MRN: [id number] Date of Birth: 08/14/1927

## 2015-12-03 NOTE — Telephone Encounter (Signed)
Called [facility] [phone] spoke to [place] she will put in request to NP - neede letter faxed to [place] that NP agrees patient needs to be written out of work for 1 week or more /Starting tomorrow. Requested that letter be faxed to [place] as well. NF 12/03/15 Notify pt of insurance benefits on next visit

## 2015-12-03 NOTE — Telephone Encounter (Signed)
This was done and given copy to patient

## 2015-12-08 ENCOUNTER — Encounter (HOSPITAL_COMMUNITY): Payer: Self-pay

## 2015-12-08 ENCOUNTER — Telehealth (HOSPITAL_COMMUNITY): Payer: Self-pay | Admitting: Physical Therapy

## 2015-12-08 ENCOUNTER — Ambulatory Visit (INDEPENDENT_AMBULATORY_CARE_PROVIDER_SITE_OTHER): Payer: Medicare Other | Admitting: Family Medicine

## 2015-12-08 ENCOUNTER — Ambulatory Visit (HOSPITAL_COMMUNITY): Payer: Medicare Other | Attending: Family | Admitting: Physical Therapy

## 2015-12-08 ENCOUNTER — Inpatient Hospital Stay (HOSPITAL_COMMUNITY)
Admission: AD | Admit: 2015-12-08 | Discharge: 2015-12-14 | DRG: 603 | Disposition: A | Discharge status: Home Health | Payer: Medicare Other | Source: Ambulatory Visit | Attending: Internal Medicine | Admitting: Internal Medicine

## 2015-12-08 VITALS — BP 110/53 | HR 83 | Temp 97.2°F | Ht 62.0 in | Wt 116.4 lb

## 2015-12-08 DIAGNOSIS — D649 Anemia, unspecified: Secondary | ICD-10-CM | POA: Diagnosis not present

## 2015-12-08 DIAGNOSIS — I743 Embolism and thrombosis of arteries of the lower extremities: Secondary | ICD-10-CM | POA: Diagnosis not present

## 2015-12-08 DIAGNOSIS — L03115 Cellulitis of right lower limb: Principal | ICD-10-CM | POA: Diagnosis present

## 2015-12-08 DIAGNOSIS — I2510 Atherosclerotic heart disease of native coronary artery without angina pectoris: Secondary | ICD-10-CM | POA: Diagnosis not present

## 2015-12-08 DIAGNOSIS — I110 Hypertensive heart disease with heart failure: Secondary | ICD-10-CM | POA: Diagnosis present

## 2015-12-08 DIAGNOSIS — Z7901 Long term (current) use of anticoagulants: Secondary | ICD-10-CM | POA: Diagnosis not present

## 2015-12-08 DIAGNOSIS — I482 Chronic atrial fibrillation: Secondary | ICD-10-CM | POA: Diagnosis not present

## 2015-12-08 DIAGNOSIS — L0390 Cellulitis, unspecified: Secondary | ICD-10-CM

## 2015-12-08 DIAGNOSIS — L97319 Non-pressure chronic ulcer of right ankle with unspecified severity: Secondary | ICD-10-CM | POA: Diagnosis present

## 2015-12-08 DIAGNOSIS — S81801A Unspecified open wound, right lower leg, initial encounter: Secondary | ICD-10-CM

## 2015-12-08 DIAGNOSIS — I10 Essential (primary) hypertension: Secondary | ICD-10-CM | POA: Diagnosis not present

## 2015-12-08 DIAGNOSIS — I83013 Varicose veins of right lower extremity with ulcer of ankle: Secondary | ICD-10-CM | POA: Insufficient documentation

## 2015-12-08 DIAGNOSIS — I5032 Chronic diastolic (congestive) heart failure: Secondary | ICD-10-CM | POA: Diagnosis present

## 2015-12-08 DIAGNOSIS — B965 Pseudomonas (aeruginosa) (mallei) (pseudomallei) as the cause of diseases classified elsewhere: Secondary | ICD-10-CM | POA: Diagnosis not present

## 2015-12-08 DIAGNOSIS — M25571 Pain in right ankle and joints of right foot: Secondary | ICD-10-CM | POA: Diagnosis not present

## 2015-12-08 DIAGNOSIS — I272 Other secondary pulmonary hypertension: Secondary | ICD-10-CM | POA: Diagnosis not present

## 2015-12-08 DIAGNOSIS — I878 Other specified disorders of veins: Secondary | ICD-10-CM | POA: Diagnosis not present

## 2015-12-08 DIAGNOSIS — Z833 Family history of diabetes mellitus: Secondary | ICD-10-CM

## 2015-12-08 DIAGNOSIS — M79604 Pain in right leg: Secondary | ICD-10-CM | POA: Diagnosis present

## 2015-12-08 DIAGNOSIS — R262 Difficulty in walking, not elsewhere classified: Secondary | ICD-10-CM | POA: Insufficient documentation

## 2015-12-08 DIAGNOSIS — I4891 Unspecified atrial fibrillation: Secondary | ICD-10-CM | POA: Diagnosis present

## 2015-12-08 DIAGNOSIS — Z96652 Presence of left artificial knee joint: Secondary | ICD-10-CM | POA: Diagnosis not present

## 2015-12-08 HISTORY — DX: Atherosclerotic heart disease of native coronary artery without angina pectoris: I25.10

## 2015-12-08 LAB — CBC
HEMATOCRIT: 32.1 % — AB (ref 36.0–46.0)
HEMOGLOBIN: 10.3 g/dL — AB (ref 12.0–15.0)
MCH: 30.5 pg (ref 26.0–34.0)
MCHC: 32.1 g/dL (ref 30.0–36.0)
MCV: 95 fL (ref 78.0–100.0)
PLATELETS: 325 10*3/uL (ref 150–400)
RBC: 3.38 MIL/uL — AB (ref 3.87–5.11)
RDW: 14.2 % (ref 11.5–15.5)
WBC: 6.1 10*3/uL (ref 4.0–10.5)

## 2015-12-08 LAB — COMPREHENSIVE METABOLIC PANEL
ALT: 12 U/L — AB (ref 14–54)
AST: 15 U/L (ref 15–41)
Albumin: 3.4 g/dL — ABNORMAL LOW (ref 3.5–5.0)
Alkaline Phosphatase: 93 U/L (ref 38–126)
Anion gap: 10 (ref 5–15)
BUN: 17 mg/dL (ref 6–20)
CHLORIDE: 99 mmol/L — AB (ref 101–111)
CO2: 27 mmol/L (ref 22–32)
CREATININE: 1.02 mg/dL — AB (ref 0.44–1.00)
Calcium: 9.1 mg/dL (ref 8.9–10.3)
GFR, EST AFRICAN AMERICAN: 56 mL/min — AB (ref >60)
GFR, EST NON AFRICAN AMERICAN: 48 mL/min — AB (ref >60)
Glucose, Bld: 93 mg/dL (ref 65–99)
POTASSIUM: 3.9 mmol/L (ref 3.5–5.1)
SODIUM: 136 mmol/L (ref 135–145)
Total Bilirubin: 0.4 mg/dL (ref 0.3–1.2)
Total Protein: 6.9 g/dL (ref 6.5–8.1)

## 2015-12-08 MED ORDER — HYDROMORPHONE HCL 1 MG/ML IJ SOLN
0.5000 mg | INTRAMUSCULAR | Status: DC | PRN
  Administered 2015-12-09 (×2): 0.5 mg via INTRAVENOUS
  Filled 2015-12-08 (×2): qty 1

## 2015-12-08 MED ORDER — VANCOMYCIN HCL IN DEXTROSE 1-5 GM/200ML-% IV SOLN
1000.0000 mg | Freq: Once | INTRAVENOUS | Status: AC
  Administered 2015-12-08: 1000 mg via INTRAVENOUS
  Filled 2015-12-08: qty 200

## 2015-12-08 MED ORDER — TRAMADOL HCL 50 MG PO TABS
50.0000 mg | ORAL_TABLET | Freq: Four times a day (QID) | ORAL | Status: DC | PRN
  Administered 2015-12-08 – 2015-12-11 (×4): 50 mg via ORAL
  Filled 2015-12-08 (×5): qty 1

## 2015-12-08 MED ORDER — HYDROMORPHONE HCL 2 MG PO TABS
2.0000 mg | ORAL_TABLET | ORAL | Status: DC | PRN
  Administered 2015-12-08 – 2015-12-09 (×2): 2 mg via ORAL
  Filled 2015-12-08 (×2): qty 1

## 2015-12-08 MED ORDER — FERROUS GLUCONATE 324 (38 FE) MG PO TABS
324.0000 mg | ORAL_TABLET | Freq: Every day | ORAL | Status: DC
  Administered 2015-12-09 – 2015-12-14 (×6): 324 mg via ORAL
  Filled 2015-12-08 (×10): qty 1

## 2015-12-08 MED ORDER — ACETAMINOPHEN 325 MG PO TABS: 650.0000 mg | ORAL_TABLET | Freq: Four times a day (QID) | ORAL | Status: DC | PRN

## 2015-12-08 MED ORDER — ACETAMINOPHEN 650 MG RE SUPP: 650.0000 mg | Freq: Four times a day (QID) | RECTAL | Status: DC | PRN

## 2015-12-08 MED ORDER — FUROSEMIDE 20 MG PO TABS: 20.0000 mg | ORAL_TABLET | Freq: Every day | ORAL | Status: DC | PRN

## 2015-12-08 MED ORDER — VANCOMYCIN HCL IN DEXTROSE 1-5 GM/200ML-% IV SOLN
1000.0000 mg | INTRAVENOUS | Status: DC
  Administered 2015-12-10: 1000 mg via INTRAVENOUS
  Filled 2015-12-08: qty 200

## 2015-12-08 MED ORDER — VITAMIN B-12 1000 MCG PO TABS
1000.0000 ug | ORAL_TABLET | Freq: Every day | ORAL | Status: DC
  Administered 2015-12-09 – 2015-12-14 (×6): 1000 ug via ORAL
  Filled 2015-12-08 (×6): qty 1

## 2015-12-08 MED ORDER — DEXTROSE 5 % IV SOLN
1.0000 g | INTRAVENOUS | Status: DC
  Administered 2015-12-09 – 2015-12-10 (×2): 1 g via INTRAVENOUS
  Filled 2015-12-08 (×6): qty 10

## 2015-12-08 MED ORDER — APIXABAN 2.5 MG PO TABS
2.5000 mg | ORAL_TABLET | Freq: Two times a day (BID) | ORAL | Status: DC
  Administered 2015-12-08 – 2015-12-14 (×12): 2.5 mg via ORAL
  Filled 2015-12-08 (×12): qty 1

## 2015-12-08 MED ORDER — SODIUM CHLORIDE 0.9 % IV SOLN
INTRAVENOUS | Status: AC
  Administered 2015-12-08: 20:00:00 via INTRAVENOUS

## 2015-12-08 MED ORDER — DILTIAZEM HCL ER COATED BEADS 240 MG PO CP24
240.0000 mg | ORAL_CAPSULE | Freq: Every day | ORAL | Status: DC
  Administered 2015-12-09 – 2015-12-14 (×6): 240 mg via ORAL
  Filled 2015-12-08 (×6): qty 1

## 2015-12-08 NOTE — Progress Notes (Signed)
Pharmacy Antibiotic Note  Jody [name] is a 82 y.o. female admitted on 12/08/2015 with cellulitis.  Pharmacy has been consulted for vancomycin dosing.  Plan: Vancomycin 1 gm IV x 1 then 1gm IV q48 hours F/u renal function, cultures and clinical course  Height: 5\' 2"  (157.5 cm) Weight: 115 lb 11.9 oz (52.5 kg) IBW/kg (Calculated) : 50.1  Temp (24hrs), Avg:98 F (36.7 C), Min:97.2 F (36.2 C), Max:98.7 F (37.1 C)  No results for input(s): WBC, CREATININE, LATICACIDVEN, VANCOTROUGH, VANCOPEAK, VANCORANDOM, GENTTROUGH, GENTPEAK, GENTRANDOM, TOBRATROUGH, TOBRAPEAK, TOBRARND, AMIKACINPEAK, AMIKACINTROU, AMIKACIN in the last 168 hours.  CrCl cannot be calculated (Patient's most recent lab result is older than the maximum 21 days allowed.).    Allergies  Allergen Reactions  . Levaquin [Levofloxacin] Other (See Comments)    Patient stated vision became blurry and became weak.    Antimicrobials this admission: vanc  9/5>>   Thank you for allowing pharmacy to be a part of this patient's care.  Beverlee Nims 12/08/2015 6:43 PM

## 2015-12-08 NOTE — Therapy (Signed)
[place], [place], [address] Phone: [phone]   Fax:  [phone]  Wound Care Therapy  Patient Details  Name: Jody [name] MRN: [id number] Date of Birth: 18-Dec-1927 Referring Provider: Sharion Balloon   Encounter Date: 12/08/2015      PT End of Session - 12/08/15 0914    Visit Number 2   Number of Visits 12   Date for PT Re-Evaluation 01/02/16   Authorization Type UHC medicare   Authorization - Visit Number 2   Authorization - Number of Visits 10   PT Start Time 0815   PT Stop Time 0850   PT Time Calculation (min) 35 min   Activity Tolerance Patient limited by pain   Behavior During Therapy [facility] for tasks assessed/performed      Past Medical History:  Diagnosis Date  . Afib ([place])   . Anemia   . Chronic diastolic heart failure ([place])   . HTN (hypertension)   . Mitral regurgitation    Moderate, echo, 07/2012  . Pulmonary hypertension ([place])     Past Surgical History:  Procedure Laterality Date  . JOINT REPLACEMENT Right Feb 2015   Dr. Case- [place]    There were no vitals filed for this visit.                  Wound Therapy - 12/08/15 0907    Subjective Pt states that she is in a lot of pain.  Stayed home from work and kept her let up    Patient and Family Stated Goals For the wound to heal    Date of Onset 10/22/15   Prior Treatments self care and antibiotics    Pain Assessment 0-10   Pain Score 9    Pain Type Acute pain   Pain Location Ankle   Pain Orientation Right   Pain Descriptors / Indicators Burning   Patients Stated Pain Goal 0   Pain Intervention(s) Emotional support   Evaluation and Treatment Procedures Explained to Patient/Family Yes   Evaluation and Treatment Procedures agreed to   Wound Properties Date First Assessed: 12/03/15 Time First Assessed: 0951 Wound Type: Other (Comment) Location: Ankle Location Orientation: Right Wound Description (Comments): lateral  aspect Present on Admission: Yes   Dressing Type Gauze (Comment)   Dressing Changed Changed   Dressing Status Old drainage   Dressing Change Frequency PRN   Site / Wound Assessment Friable;Painful;Yellow   % Wound base Red or Granulating 0%   % Wound base Yellow 100%   Peri-wound Assessment Edema   Drainage Amount Moderate   Drainage Description Purulent;Odor   Treatment Cleansed;Debridement (Selective)   Wound Properties Date First Assessed: 12/03/15 Time First Assessed: 1030 Wound Type: Other (Comment) Location: Ankle Location Orientation: Right Wound Description (Comments): medical aspect    Dressing Type Gauze (Comment);Silver hydrofiber   Dressing Changed Changed   Dressing Status Old drainage   Dressing Change Frequency PRN   Site / Wound Assessment Friable;Yellow   % Wound base Red or Granulating 0%   % Wound base Yellow 100%   Peri-wound Assessment Erythema (blanchable)   Wound Length (cm) 5 cm   Wound Width (cm) 4 cm   Drainage Amount Moderate   Drainage Description Purulent;Odor   Treatment Cleansed;Debridement (Selective)   Wound Therapy - Clinical Statement Wound has thick slough today.  Wound continues to spread and pt has a high amount of pain.  Therapist feels pt would benefit from IV antibiotic  as she has already completed one round of antibiotic, received a shot and is on a second dose of antibiotic.  Pain level is so high that it is difficult to debride.    Wound Therapy - Functional Problem List pain limiting walking,    Factors Delaying/Impairing Wound Healing Infection - systemic/local;Vascular compromise   Hydrotherapy Plan Debridement;Dressing change;Patient/family education   Wound Therapy - Frequency --  2x week x 6 weeks.    Wound Therapy - Current Recommendations PT   Wound Plan Pt education, wound care inclucding cleansing, debridement and multilayer dressing changes.    Dressing  Silverhydrofiber to wounds, vaseline to periphery,and gauze as pt is going  to MD this am.                   PT Short Term Goals - 12/03/15 1102      PT SHORT TERM GOAL #1   Title Pt pain in her right ankle to be no greater than a 5/10 to allow pt to be on her feet for more than 30 minutes.   Time 3   Period Weeks   Status New     PT SHORT TERM GOAL #2   Title Pt drainage from wounds on right ankle to be scant to decrease risk of infection    Time 3   Period Weeks   Status New     PT SHORT TERM GOAL #3   Title Pt wound size to have decreased by 3 cm x 3 cm to decrease risk of infection    Time 3   Period Weeks           PT Long Term Goals - 12/03/15 1109      PT LONG TERM GOAL #1   Title Pt pain level to be no greater than a 2/10 to allow pt to be able to be on her feet for up to 40 minutes for work tasks.    Time 6   Period Weeks   Status New     PT LONG TERM GOAL #2   Title Pt wounds to be healed    Time 6   Period Weeks   Status New     PT LONG TERM GOAL #3   Title Pt to be wearing compression garments everyday and to verbalize the importance of why you need to wear compression when you have varicosities.     Time 6   Period Weeks               Plan - 12/08/15 0915    Clinical Impression Statement see above    Rehab Potential Good   PT Frequency 2x / week   PT Duration 6 weeks   PT Treatment/Interventions ADLs/Self Care Home Management;Patient/family education;Other (comment)   PT Next Visit Plan Cleanse wound.  Debride if able, this is very painful for patient at this time.  Assess how pt tolerated profore lite.  If pt tolerated this well may progress to profore.    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 Preoperative clearance 06/23/2014  . (HFpEF) heart failure with preserved ejection fraction ([place]) 04/16/2013  . Mitral regurgitation   . Anemia   . HTN (hypertension)   . Atrial fibrillation ([place]) 04/02/2009  . DIASTOLIC HEART FAILURE, CHRONIC 04/02/2009    Rayetta Humphrey, PT CLT [phone] 12/08/2015, 9:16 AM  [place] [address] [place], [place], [address] Phone: [phone]   Fax:  [phone]  Name: NATHALIA HERMRECK MRN: [id number] Date of Birth: 15-Mar-1928

## 2015-12-08 NOTE — H&P (Signed)
TRH H&P   Patient Demographics:    Jody [name], is a 82 y.o. female  MRN: [id number]   DOB - January 06, 1928  Admit Date - 12/08/2015  Outpatient Primary MD for the patient is Evelina Dun, FNP  Referring MD/NP/PA:  Dr. Vinson Moselle  Outpatient Specialists:  Wound care center  Patient coming from: office  No chief complaint on file.  Venous stasis change, ulcer   HPI:    Jody [name]  is a 82 y.o. female, has had a skin ulcer on the distal right lower ext x 46months.  Pt has been to wound care center who recommended iv abx.  Since oral abx not effective.   pts primary care physician requested direct abdmission for iv abx.  Denies fever, chills.  + slight redness of the area around the wound per pt.        Review of systems:    In addition to the HPI above,  No Fever-chills, No Headache, No changes with Vision or hearing, No problems swallowing food or Liquids, No Chest pain, Cough or Shortness of Breath, No Abdominal pain, No Nausea or Vommitting, Bowel movements are regular, No Blood in stool or Urine, No dysuria,  No new joints pains-aches,  No new weakness, tingling, numbness in any extremity, No recent weight gain or loss, No polyuria, polydypsia or polyphagia, No significant Mental Stressors.  A full 10 point Review of Systems was done, except as stated above, all other Review of Systems were negative.   With Past History of the following :    Past Medical History:  Diagnosis Date  . Afib ([place])   . Anemia   . CAD (coronary artery disease)   . Chronic diastolic heart failure ([place])   . HTN (hypertension)   . Mitral regurgitation    Moderate, echo, 07/2012  . Pulmonary hypertension ([place])       Past Surgical History:  Procedure Laterality Date  . JOINT REPLACEMENT Right Feb 2015   Dr. Case- [facility] Noss  . TOTAL KNEE ARTHROPLASTY Left       Social History:      Social History  Substance Use Topics  . Smoking status: Never Smoker  . Smokeless tobacco: Never Used  . Alcohol use No     Lives - at home  Mobility - walks by self   Family History :     Family History  Problem Relation Age of Onset  . Diabetes Mother       Home Medications:   Prior to Admission medications   Medication Sig Start Date End Date Taking? Authorizing Provider  diltiazem (CARDIZEM CD) 240 MG 24 hr capsule TAKE ONE CAPSULE BY MOUTH ONCE DAILY 05/25/15   Deboraha Sprang, MD  doxycycline (VIBRA-TABS) 100 MG tablet Take 1 tablet (100 mg total) by mouth 2 (two) times daily. 12/02/15   Sharion Balloon, FNP  ELIQUIS 2.5  MG TABS tablet TAKE ONE TABLET BY MOUTH TWICE DAILY 05/25/15   Deboraha Sprang, MD  furosemide (LASIX) 20 MG tablet Take 1 tablet (20 mg total) by mouth daily as needed for edema. Pt is taking one pill by mouth every two weeks. 05/05/15   Deboraha Sprang, MD  IRON, FERROUS GLUCONATE, PO Take 27 mg by mouth.    Historical Provider, MD  mupirocin ointment (BACTROBAN) 2 % Apply 1 application topically 2 (two) times daily. 11/19/15   Sharion Balloon, FNP  traMADol (ULTRAM) 50 MG tablet Take 1-2 tablets (50-100 mg total) by mouth every 8 (eight) hours as needed. 12/02/15   Sharion Balloon, FNP  vitamin B-12 (CYANOCOBALAMIN) 1000 MCG tablet Take 1,000 mcg by mouth daily.    Historical Provider, MD     Allergies:     Allergies  Allergen Reactions  . Levaquin [Levofloxacin] Other (See Comments)    Patient stated vision became blurry and became weak.     Physical Exam:   Vitals  Blood pressure 126/62, pulse 74, temperature 98.7 F (37.1 C), temperature source Oral, resp. rate 20, height 5\' 2"  (1.575 m), weight 52.5 kg (115 lb 11.9 oz), SpO2 90 %.   1. General lying in bed in NAD,    2. Normal affect and insight, Not Suicidal or Homicidal, Awake Alert, Oriented X 3.  3. No F.N deficits, ALL C.Nerves Intact, Strength 5/5 all 4 extremities, Sensation  intact all 4 extremities, Plantars down going.  4. Ears and Eyes appear Normal, Conjunctivae clear, PERRLA. Moist Oral Mucosa.  5. Supple Neck, No JVD, No cervical lymphadenopathy appriciated, No Carotid Bruits.  6. Symmetrical Chest wall movement, Good air movement bilaterally, CTAB.  7. RRR, No Gallops, Rubs or Murmurs, No Parasternal Heave.  8. Positive Bowel Sounds, Abdomen Soft, No tenderness, No organomegaly appriciated,No rebound -guarding or rigidity.  9.  3.5cm skin ulcer on the right distal lower ext, surrounded by erythema.    10. Good muscle tone,  joints appear normal , no effusions, Normal ROM.  11. No Palpable Lymph Nodes in Neck or Axillae     Data Review:    CBC No results for input(s): WBC, HGB, HCT, PLT, MCV, MCH, MCHC, RDW, LYMPHSABS, MONOABS, EOSABS, BASOSABS, BANDABS in the last 168 hours.  Invalid input(s): NEUTRABS, BANDSABD ------------------------------------------------------------------------------------------------------------------  Chemistries  No results for input(s): NA, K, CL, CO2, GLUCOSE, BUN, CREATININE, CALCIUM, MG, AST, ALT, ALKPHOS, BILITOT in the last 168 hours.  Invalid input(s): GFRCGP ------------------------------------------------------------------------------------------------------------------ CrCl cannot be calculated (Patient's most recent lab result is older than the maximum 21 days allowed.). ------------------------------------------------------------------------------------------------------------------ No results for input(s): TSH, T4TOTAL, T3FREE, THYROIDAB in the last 72 hours.  Invalid input(s): FREET3  Coagulation profile No results for input(s): INR, PROTIME in the last 168 hours. ------------------------------------------------------------------------------------------------------------------- No results for input(s): DDIMER in the last 72  hours. -------------------------------------------------------------------------------------------------------------------  Cardiac Enzymes No results for input(s): CKMB, TROPONINI, MYOGLOBIN in the last 168 hours.  Invalid input(s): CK ------------------------------------------------------------------------------------------------------------------ No results found for: BNP   ---------------------------------------------------------------------------------------------------------------  Urinalysis No results found for: COLORURINE, APPEARANCEUR, LABSPEC, [place], GLUCOSEU, HGBUR, BILIRUBINUR, KETONESUR, PROTEINUR, UROBILINOGEN, NITRITE, LEUKOCYTESUR  ----------------------------------------------------------------------------------------------------------------   Imaging Results:    No results found.    Assessment & Plan:    Active Problems:   Atrial fibrillation (HCC)   HTN (hypertension)   Anemia   Cellulitis    1. Cellulitis Wound culture vanco iv, pharmacy to dose Rocephin 1gm iv qday dialudid for pain  2. Pafib Cont eliquis  3. Hypertension Cont cardizem  4. Hx of anemia Check cbc , cmp     DVT Prophylaxis  ELIQUIS  AM Labs Ordered, also please review Full Orders  Family Communication: Admission, patients condition and plan of care including tests being ordered have been discussed with the patient  who indicate understanding and agree with the plan and Code Status.  Code Status FULL CODE  Likely DC to  Home vs rehab  Condition GUARDED    Consults called: none  Admission status: inpatient  Time spent in minutes : 45 minutes   Jani Gravel M.D on 12/08/2015 at 5:33 PM  Between 7am to 7pm - Pager - [phone] After 7pm go to [email or web address] - password [facility]  [facility] - Office  [phone]

## 2015-12-08 NOTE — Progress Notes (Signed)
Subjective:  Patient ID: Jody [name], female    DOB: [birth year]  Age: 82 y.o. MRN: [id number]  CC: infected wound (pt was seen at wound center and pt was told to come here b/c she needed to be admitted to the hospital for IV antibiotics as they couldn't take care of the damaged tissue at wound center.)   HPI Jody [name] presents for intense burning pain at right distal leg.Patient was sent here today by the wound Center. She had been referred there several days ago and was seen today for her second visit. Her pain is intractable. It is 10/10 perhaps worse she says. The pain is at the site of the wound in the right lower leg. She describes it as a burning. The wound center as stated that they felt she needed inpatient treatment with IV antibiotics and sent her here to achieve that admission. Meanwhile the patient has been on multiple antibiotics including IM Rocephin here at the office. She has been debrided at the wound center and therapeutic dressings have been used multiple times without resolution of her condition.   History Jody [name] has a past medical history of Afib ([place]); Anemia; CAD (coronary artery disease); Chronic diastolic heart failure ([place]); HTN (hypertension); Mitral regurgitation; and Pulmonary hypertension ([place]).   She has a past surgical history that includes Joint replacement (Right, Feb 2015) and Total knee arthroplasty (Left).   Her family history includes Diabetes in her mother.She reports that she has never smoked. She has never used smokeless tobacco. She reports that she does not drink alcohol or use drugs.    ROS Review of Systems  Constitutional: Negative for activity change, appetite change and fever.  HENT: Positive for hearing loss. Negative for congestion, rhinorrhea and sore throat.   Eyes: Negative for visual disturbance.  Respiratory: Negative for cough and shortness of breath.   Cardiovascular: Negative for chest pain and palpitations.    Gastrointestinal: Negative for abdominal pain, diarrhea and nausea.  Genitourinary: Negative for dysuria.  Musculoskeletal: Positive for myalgias.  Psychiatric/Behavioral: Positive for agitation (due to pain). Negative for confusion.    Objective:  BP (!) 110/53   Pulse 83   Temp 97.2 F (36.2 C) (Oral)   Ht 5\' 2"  (1.575 m)   Wt 116 lb 6 oz (52.8 kg)   BMI 21.29 kg/m   BP Readings from Last 3 Encounters:  12/08/15 126/62  12/08/15 (!) 110/53  12/02/15 (!) 127/59    Wt Readings from Last 3 Encounters:  12/08/15 115 lb 11.9 oz (52.5 kg)  12/08/15 116 lb 6 oz (52.8 kg)  12/02/15 116 lb 9.6 oz (52.9 kg)     Physical Exam  Constitutional: She is oriented to person, place, and time. She appears distressed.  Elderly, frail, cachectic  HENT:  Head: Normocephalic and atraumatic.  Right Ear: External ear normal.  Left Ear: External ear normal.  Nose: Nose normal.  Mouth/Throat: Oropharynx is clear and moist.  Eyes: Conjunctivae and EOM are normal. Pupils are equal, round, and reactive to light.  Neck: Normal range of motion. Neck supple. No thyromegaly present.  Cardiovascular: Normal rate.  Exam reveals no gallop.   Murmur heard. Pulmonary/Chest: Effort normal and breath sounds normal. No respiratory distress. She has no wheezes. She has no rales.  Abdominal: Soft. There is no tenderness.  Musculoskeletal: Normal range of motion. She exhibits edema and tenderness (at RLE. ).  Neurological: She is alert and oriented to person, place, and time. She has normal reflexes.  Skin: Skin is warm and dry. She is not diaphoretic. There is erythema (Erythema from ankle to upper calf/shin. There are 2 decubiti ,easuring 3-4 cm each. Both have bed of yelloe necrotic tissue.).  Psychiatric: She has a normal mood and affect. Her behavior is normal. Judgment and thought content normal.       No results found.  Assessment & Plan:   Jody [name] was seen today for infected  wound.  Diagnoses and all orders for this visit:  Cellulitis of leg, right  Venous stasis  Venous stasis ulcer of ankle, right [facility])  Patient was transferred to [facility]  by private vehicle to be admitted. Case discussed with hospitalist prior to transfer.  Follow-up: after hospital DC  Claretta Fraise, M.D.

## 2015-12-08 NOTE — Telephone Encounter (Signed)
Her Daughter called and cx the 9/8 and 9/12 and will wait to see if her mother can come later this month. Purcell Nails [phone], ok to leave a message. NF

## 2015-12-09 ENCOUNTER — Inpatient Hospital Stay (HOSPITAL_COMMUNITY): Payer: Medicare Other

## 2015-12-09 DIAGNOSIS — I10 Essential (primary) hypertension: Secondary | ICD-10-CM

## 2015-12-09 DIAGNOSIS — I482 Chronic atrial fibrillation: Secondary | ICD-10-CM

## 2015-12-09 DIAGNOSIS — D649 Anemia, unspecified: Secondary | ICD-10-CM

## 2015-12-09 DIAGNOSIS — L03115 Cellulitis of right lower limb: Principal | ICD-10-CM

## 2015-12-09 MED ORDER — COLLAGENASE 250 UNIT/GM EX OINT
TOPICAL_OINTMENT | Freq: Every day | CUTANEOUS | Status: DC
  Administered 2015-12-09: 14:00:00 via TOPICAL
  Administered 2015-12-10: 1 via TOPICAL
  Administered 2015-12-11 – 2015-12-14 (×4): via TOPICAL
  Filled 2015-12-09: qty 30

## 2015-12-09 MED ORDER — KETOROLAC TROMETHAMINE 15 MG/ML IJ SOLN
15.0000 mg | Freq: Four times a day (QID) | INTRAMUSCULAR | Status: AC | PRN
  Administered 2015-12-14: 15 mg via INTRAVENOUS
  Filled 2015-12-09 (×2): qty 1

## 2015-12-09 MED ORDER — OXYCODONE-ACETAMINOPHEN 5-325 MG PO TABS
1.0000 | ORAL_TABLET | ORAL | Status: DC | PRN
  Administered 2015-12-09 – 2015-12-10 (×4): 2 via ORAL
  Administered 2015-12-10 – 2015-12-11 (×3): 1 via ORAL
  Administered 2015-12-12 – 2015-12-14 (×10): 2 via ORAL
  Filled 2015-12-09: qty 2
  Filled 2015-12-09 (×2): qty 1
  Filled 2015-12-09 (×6): qty 2
  Filled 2015-12-09 (×2): qty 1
  Filled 2015-12-09: qty 2
  Filled 2015-12-09: qty 1
  Filled 2015-12-09 (×5): qty 2

## 2015-12-09 NOTE — Consult Note (Addendum)
[facility] Nurse wound consult note Consult performed via remote camera with bedside assistance for measurements and assessment. Reason for Consult: Consult requested for right ankle wounds.  Pt has been followed by the outpatient wound care center prior to admission and states wounds declined this week. Wound type: Right leg with cellulitis;  generalized erythremia and edema  Measurement: Right inner ankle with patchy area of partial thickness maceration; affected area approx 6X6cm, red moist skin, mod amt yellow drainage, no odor Wound bed: Right outer ankle with full thickness wound; 6X4cm, irregular in shape, 100% tightly adhered yellow slough, mod amt yellow drainage, no odor.  Very painful to touch. Dressing procedure/placement/frequency: Aquacel to inner ankle to provide antimicrobial benefits and absorb drainage.  Santyl to right outer ankle for enzymatic debridement of nonviable tissue.  Pt has an MRI ordered and could benefit from ABI to assess for perfusion status. Please re-consult if further assistance is needed.  Thank-you,  Julien Girt MSN, [place], [place], [place], [place]

## 2015-12-09 NOTE — Care Management Important Message (Signed)
Important Message  Patient Details  Name: Jody [name] MRN: [id number] Date of Birth: 01-23-1928   Medicare Important Message Given:  Yes    Brentin Shin, Chaun[place]y [place], RN 12/09/2015, 12:58 PM

## 2015-12-09 NOTE — Care Management Note (Signed)
Case Management Note  Patient Details  Name: EVELETT CHIARA MRN: [id number] Date of Birth: Aug 06, 1927  Subjective/Objective: Patient is from home. Her daughter is at bedside during assessment. She has a cane at home, she uses at times.  She goes to [place] for primary care. She has been currently been going to Wound center and was recommended for she should have IV antibiotics as her cellulitis has progressed. She has insurance and reports no issues affording her medications.                  Action/Plan: Will follow for needs.   Expected Discharge Date:                  Expected Discharge Plan:  Home/Self Care  In-House Referral:  NA  Discharge planning Services  CM Consult  Post Acute Care Choice:  NA Choice offered to:  NA  DME Arranged:    DME Agency:     HH Arranged:    HH Agency:     Status of Service:  In process, will continue to follow  If discussed at Long Length of Stay Meetings, dates discussed:    Additional Comments:  Lyric Hoar, Chauncey Reading, RN 12/09/2015, 12:46 PM

## 2015-12-09 NOTE — Progress Notes (Signed)
PROGRESS NOTE    Jody [name]  [id number] DOB: 07-31-1927 DOA: 12/08/2015 PCP: Evelina Dun, FNP    Brief Narrative:  55 yof with a hx of afib, HTN, anemia, heart failure, and a right ankle skin ulcer on the distal right lower extremity. Patient was referred to the hospital by her wound care center since her symptoms did not improve with oral antibiotics. She was admitted for further evaluation of cellulitis.   Assessment & Plan:   Active Problems:   Atrial fibrillation (HCC)   HTN (hypertension)   Anemia   Cellulitis   1. Cellulitis. She has a skin ulcer on the distal right lower extremity with surrounding cellulitis. Family reports it has been present for approximately 8 weeks and it has progressively gotten worse. She has completed several rounds of oral antibiotics without significant improvement. She will need further imaging with MRI to evaluate for deep tissue infection. Wound cultures have been ordered. Wound care has been consulted. Continue on IV antibiotics with vancomycin and rocephin. 2. Chronic A-fib. Rate controlled with Cardizem. She is anticoagulated with eliquis.  3. HTN. Stable. Continue outpatient regimen.  4. Anemia.  Hemoglobin is 10.3. Continue to monitor.   DVT prophylaxis: Eliquis  Code Status: Full Family Communication: Daughter bedside Disposition Plan: Discharge home once improved    Consultants:   Wound care   Procedures:   None   Antimicrobials:   Vancomycin 9/3 >>  Rocephin 9/5 >>   Subjective: Per daughter, she is not eating. Pain still remains, groaning as the ulcer is undressed. She describes the pain as burning and states that it radiates up her right leg. She is nauseous and has vomited. The redness has not improved.   Objective: Vitals:   12/08/15 1544 12/08/15 2140 12/08/15 2337 12/09/15 0443  BP: 126/62   128/77  Pulse: 74 70  99  Resp: 20 20    Temp: 98.7 F (37.1 C) 97.9 F (36.6 C)  98 F (36.7 C)  TempSrc:  Oral Oral  Oral  SpO2: 90% 95% 95% 93%  Weight: 52.5 kg (115 lb 11.9 oz)   52.1 kg (114 lb 13.8 oz)  Height: 5\' 2"  (1.575 m)   5\' 2"  (1.575 m)    Intake/Output Summary (Last 24 hours) at 12/09/15 0717 Last data filed at 12/09/15 0400  Gross per 24 hour  Intake           831.67 ml  Output              600 ml  Net           231.67 ml   Filed Weights   12/08/15 1544 12/09/15 0443  Weight: 52.5 kg (115 lb 11.9 oz) 52.1 kg (114 lb 13.8 oz)    Examination:  General exam: Appears calm and comfortable  Respiratory system: Clear to auscultation. Respiratory effort normal. Cardiovascular system: S1 & S2 heard. No JVD, murmurs, rubs, gallops or clicks. Irregular heart sounds. 1+ edema in the right lower extremity. Gastrointestinal system: Abdomen is nondistended, soft and nontender. No organomegaly or masses felt. Normal bowel sounds heard. Central nervous system: Alert and oriented. No focal neurological deficits. Extremities: Symmetric 5 x 5 power. Poor circulation. No edema. Skin: Ulcer on right ankle with surrounding erythema. Skin is very tender to palpation. Clear drainage noted from wound. Psychiatry: Judgement and insight appear normal. Mood & affect appropriate.     Data Reviewed: I have personally reviewed following labs and imaging studies  CBC:  Recent  Labs Lab 12/08/15 1828  WBC 6.1  HGB 10.3*  HCT 32.1*  MCV 95.0  PLT [id number]   Basic Metabolic Panel:  Recent Labs Lab 12/08/15 1828  NA 136  K 3.9  CL 99*  CO2 27  GLUCOSE 93  BUN 17  CREATININE 1.02*  CALCIUM 9.1   GFR: Estimated Creatinine Clearance: 31.3 mL/min (by C-G formula based on SCr of 1.02 mg/dL). Liver Function Tests:  Recent Labs Lab 12/08/15 1828  AST 15  ALT 12*  ALKPHOS 93  BILITOT 0.4  PROT 6.9  ALBUMIN 3.4*   No results for input(s): LIPASE, AMYLASE in the last 168 hours. No results for input(s): AMMONIA in the last 168 hours. Coagulation Profile: No results for input(s): INR,  PROTIME in the last 168 hours. Cardiac Enzymes: No results for input(s): CKTOTAL, CKMB, CKMBINDEX, TROPONINI in the last 168 hours. BNP (last 3 results) No results for input(s): PROBNP in the last 8760 hours. HbA1C: No results for input(s): HGBA1C in the last 72 hours. CBG: No results for input(s): GLUCAP in the last 168 hours. Lipid Profile: No results for input(s): CHOL, HDL, LDLCALC, TRIG, CHOLHDL, LDLDIRECT in the last 72 hours. Thyroid Function Tests: No results for input(s): TSH, T4TOTAL, FREET4, T3FREE, THYROIDAB in the last 72 hours. Anemia Panel: No results for input(s): VITAMINB12, FOLATE, FERRITIN, TIBC, IRON, RETICCTPCT in the last 72 hours. Sepsis Labs: No results for input(s): PROCALCITON, LATICACIDVEN in the last 168 hours.  No results found for this or any previous visit (from the past 240 hour(s)).       Radiology Studies: No results found.      Scheduled Meds: . apixaban  2.5 mg Oral BID  . cefTRIAXone (ROCEPHIN)  IV  1 g Intravenous Q24H  . diltiazem  240 mg Oral Daily  . ferrous gluconate  324 mg Oral Daily  . [START ON 12/10/2015] vancomycin  1,000 mg Intravenous Q48H  . vitamin B-12  1,000 mcg Oral Daily   Continuous Infusions:    LOS: 1 day    Time spent: 25 minutes     Kathie Dike, MD [facility] If 7PM-7AM, please contact night-coverage [email or web address] Password [email or web address] 12/09/2015, 7:17 AM   By signing my name below, I, Collene Leyden, attest that this documentation has been prepared under the direction and in the presence of Kathie Dike, MD. Electronically signed: Collene Leyden, Scribe. 12/09/15 11:38 AM  I, Dr. Kathie Dike, personally performed the services described in this documentaiton. All medical record entries made by the scribe were at my direction and in my presence. I have reviewed the chart and agree that the record reflects my personal performance and is accurate and complete  Kathie Dike, MD, 12/09/2015 1:00  PM

## 2015-12-10 ENCOUNTER — Inpatient Hospital Stay (HOSPITAL_COMMUNITY): Payer: Medicare Other

## 2015-12-10 LAB — BASIC METABOLIC PANEL
Anion gap: 10 (ref 5–15)
BUN: 14 mg/dL (ref 6–20)
CHLORIDE: 96 mmol/L — AB (ref 101–111)
CO2: 29 mmol/L (ref 22–32)
CREATININE: 0.91 mg/dL (ref 0.44–1.00)
Calcium: 9 mg/dL (ref 8.9–10.3)
GFR calc Af Amer: >60 mL/min (ref >60)
GFR calc non Af Amer: 56 mL/min — ABNORMAL LOW (ref >60)
Glucose, Bld: 94 mg/dL (ref 65–99)
Potassium: 4.3 mmol/L (ref 3.5–5.1)
SODIUM: 135 mmol/L (ref 135–145)

## 2015-12-10 MED ORDER — DEXTROSE 5 % IV SOLN
INTRAVENOUS | Status: AC
  Filled 2015-12-10: qty 10

## 2015-12-10 NOTE — Progress Notes (Signed)
PROGRESS NOTE    Jody [name]  [id number] DOB: [birth year] DOA: 12/08/2015 PCP: Evelina Dun, FNP    Brief Narrative:  50 yof with a hx of afib, HTN, anemia, heart failure, and a right ankle skin ulcer on the distal right lower extremity. Patient was referred to the hospital by her wound care center since her symptoms did not improve with oral antibiotics. She was admitted for further evaluation of cellulitis.   Assessment & Plan:   Active Problems:   Atrial fibrillation (HCC)   HTN (hypertension)   Anemia   Cellulitis 1. Cellulitis. She has a skin ulcer on the distal right lower extremity with surrounding cellulitis. Family reports it has been present for approximately 8 weeks and it has progressively gotten worse. She has completed several rounds of oral antibiotics without significant improvement. MRI of RLE did not show any evidence of deep tissue infection. Wound cultures have been ordered. Wound care was consulted and placed a Aquacel to her inner ankle to absorb drainage and santyl to her right outer ankle for enzymic debridement of nonviable tissue. Continue on IV antibiotics with vancomycin and rocephin. Check ABIs 2. Chronic A-fib. Rate controlled with Cardizem. She is anticoagulated with eliquis.  3. HTN. Stable. Continue outpatient regimen.  4. Anemia.  Hemoglobin is 10.3. Continue to monitor. No evidence of significant bleeding    DVT prophylaxis: Eliquis  Code Status: Full Family Communication: Daughter at bedside Disposition Plan: Discharge home once improved    Consultants:   Wound care   Procedures:   None   Antimicrobials:   Vancomycin 9/3 >>  Rocephin 9/5 >>   Subjective: Pain in RLE is mildly better after having pain medications.  Objective: Vitals:   12/09/15 0443 12/09/15 1453 12/09/15 2119 12/10/15 0500  BP: 128/77 129/66 (!) 123/52 (!) 115/52  Pulse: 99 79 68 74  Resp:  18 18 18   Temp: 98 F (36.7 C) 98.2 F (36.8 C) 97.9 F (36.6  C) 99.7 F (37.6 C)  TempSrc: Oral  Oral Oral  SpO2: 93% 92% 90% 91%  Weight: 52.1 kg (114 lb 13.8 oz)   51.6 kg (113 lb 12.1 oz)  Height: 5\' 2"  (1.575 m)   5\' 2"  (1.575 m)    Intake/Output Summary (Last 24 hours) at 12/10/15 0655 Last data filed at 12/10/15 0500  Gross per 24 hour  Intake              650 ml  Output             1050 ml  Net             -400 ml   Filed Weights   12/08/15 1544 12/09/15 0443 12/10/15 0500  Weight: 52.5 kg (115 lb 11.9 oz) 52.1 kg (114 lb 13.8 oz) 51.6 kg (113 lb 12.1 oz)    Examination:  General exam: Appears calm and comfortable  Respiratory system: Clear to auscultation. Respiratory effort normal. Cardiovascular system: S1 & S2 heard, RRR. No JVD, murmurs, rubs, gallops or clicks. No pedal edema. Gastrointestinal system: Abdomen is nondistended, soft and nontender. No organomegaly or masses felt. Normal bowel sounds heard. Central nervous system: Alert and oriented. No focal neurological deficits. Extremities: Symmetric 5 x 5 power. Skin: wound noted on RLE with surrounding erythema. Psychiatry: Judgement and insight appear normal. Mood & affect appropriate.     Data Reviewed: I have personally reviewed following labs and imaging studies  CBC:  Recent Labs Lab 12/08/15 1828  WBC 6.1  HGB 10.3*  HCT 32.1*  MCV 95.0  PLT [id number]   Basic Metabolic Panel:  Recent Labs Lab 12/08/15 1828  NA 136  K 3.9  CL 99*  CO2 27  GLUCOSE 93  BUN 17  CREATININE 1.02*  CALCIUM 9.1   GFR: Estimated Creatinine Clearance: 31.3 mL/min (by C-G formula based on SCr of 1.02 mg/dL). Liver Function Tests:  Recent Labs Lab 12/08/15 1828  AST 15  ALT 12*  ALKPHOS 93  BILITOT 0.4  PROT 6.9  ALBUMIN 3.4*   No results for input(s): LIPASE, AMYLASE in the last 168 hours. No results for input(s): AMMONIA in the last 168 hours. Coagulation Profile: No results for input(s): INR, PROTIME in the last 168 hours. Cardiac Enzymes: No results for  input(s): CKTOTAL, CKMB, CKMBINDEX, TROPONINI in the last 168 hours. BNP (last 3 results) No results for input(s): PROBNP in the last 8760 hours. HbA1C: No results for input(s): HGBA1C in the last 72 hours. CBG: No results for input(s): GLUCAP in the last 168 hours. Lipid Profile: No results for input(s): CHOL, HDL, LDLCALC, TRIG, CHOLHDL, LDLDIRECT in the last 72 hours. Thyroid Function Tests: No results for input(s): TSH, T4TOTAL, FREET4, T3FREE, THYROIDAB in the last 72 hours. Anemia Panel: No results for input(s): VITAMINB12, FOLATE, FERRITIN, TIBC, IRON, RETICCTPCT in the last 72 hours. Sepsis Labs: No results for input(s): PROCALCITON, LATICACIDVEN in the last 168 hours.  No results found for this or any previous visit (from the past 240 hour(s)).       Radiology Studies: Mr Tibia Fibula Right Wo Contrast  Result Date: 12/09/2015 CLINICAL DATA:  Nonhealing ulcer involving the right ankle. EXAM: MRI OF LOWER RIGHT EXTREMITY WITHOUT CONTRAST TECHNIQUE: Multiplanar, multisequence MR imaging of the right lower extremity was performed. No intravenous contrast was administered. COMPARISON:  None. FINDINGS: There is diffuse subcutaneous soft tissue swelling/edema/fluid involving the right lower extremity consistent with cellulitis. No findings to suggest myofasciitis or pyomyositis. No evidence of septic arthritis or osteomyelitis. 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 IMPRESSION: MR findings suggest cellulitis but no discrete drainable soft tissue abscess, myofasciitis, pyomyositis or osteomyelitis. Electronically Signed   By: Marijo Sanes M.D.   On: 12/09/2015 17:26        Scheduled Meds: . apixaban  2.5 mg Oral BID  . cefTRIAXone (ROCEPHIN)  IV  1 g Intravenous Q24H  . collagenase   Topical Daily  . diltiazem  240 mg Oral Daily  . ferrous gluconate  324 mg Oral Daily  . vancomycin  1,000 mg Intravenous Q48H  . vitamin B-12  1,000 mcg Oral Daily   Continuous Infusions:     LOS: 2 days    Time spent: 25 minutes     Kathie Dike, MD [facility] If 7PM-7AM, please contact night-coverage [email or web address] Password [facility] 12/10/2015, 6:55 AM

## 2015-12-11 ENCOUNTER — Ambulatory Visit (HOSPITAL_COMMUNITY): Payer: Medicare Other

## 2015-12-11 ENCOUNTER — Other Ambulatory Visit (HOSPITAL_COMMUNITY): Payer: Self-pay

## 2015-12-11 DIAGNOSIS — L03115 Cellulitis of right lower limb: Secondary | ICD-10-CM

## 2015-12-11 DIAGNOSIS — IMO0001 Reserved for inherently not codable concepts without codable children: Secondary | ICD-10-CM

## 2015-12-11 LAB — CBC
HEMATOCRIT: 33.3 % — AB (ref 36.0–46.0)
Hemoglobin: 10.4 g/dL — ABNORMAL LOW (ref 12.0–15.0)
MCH: 30.1 pg (ref 26.0–34.0)
MCHC: 31.2 g/dL (ref 30.0–36.0)
MCV: 96.5 fL (ref 78.0–100.0)
PLATELETS: 327 10*3/uL (ref 150–400)
RBC: 3.45 MIL/uL — ABNORMAL LOW (ref 3.87–5.11)
RDW: 14.2 % (ref 11.5–15.5)
WBC: 5.1 10*3/uL (ref 4.0–10.5)

## 2015-12-11 LAB — BASIC METABOLIC PANEL
Anion gap: 5 (ref 5–15)
BUN: 18 mg/dL (ref 6–20)
CO2: 30 mmol/L (ref 22–32)
CREATININE: 1.07 mg/dL — AB (ref 0.44–1.00)
Calcium: 8.8 mg/dL — ABNORMAL LOW (ref 8.9–10.3)
Chloride: 101 mmol/L (ref 101–111)
GFR calc Af Amer: 53 mL/min — ABNORMAL LOW (ref >60)
GFR, EST NON AFRICAN AMERICAN: 46 mL/min — AB (ref >60)
GLUCOSE: 85 mg/dL (ref 65–99)
POTASSIUM: 4.4 mmol/L (ref 3.5–5.1)
Sodium: 136 mmol/L (ref 135–145)

## 2015-12-11 MED ORDER — DEXTROSE 5 % IV SOLN
2.0000 g | Freq: Once | INTRAVENOUS | Status: AC
  Administered 2015-12-11: 2 g via INTRAVENOUS
  Filled 2015-12-11: qty 2

## 2015-12-11 MED ORDER — CEFTAZIDIME 1 G IJ SOLR
1.0000 g | Freq: Two times a day (BID) | INTRAMUSCULAR | Status: DC
  Administered 2015-12-11 – 2015-12-14 (×6): 1 g via INTRAVENOUS
  Filled 2015-12-11 (×8): qty 1

## 2015-12-11 MED ORDER — MAGNESIUM CITRATE PO SOLN
1.0000 | Freq: Once | ORAL | Status: AC
  Administered 2015-12-11: 1 via ORAL
  Filled 2015-12-11: qty 296

## 2015-12-11 NOTE — Progress Notes (Signed)
Pharmacy Antibiotic Note  Jody [name] is a 82 y.o. female admitted on 12/08/2015 with cellulitis.  Pharmacy has been consulted for FORTAZ dosing.  Pt has small body habitus.  Pseudomonas aeruginosa noted in culture.  Renal dysfunction noted.  Plan: Fortaz 2gm IV now then 1gm IV q12h F/u renal function, progress, and c/s  Height: 5\' 2"  (157.5 cm) Weight: 116 lb 6.5 oz (52.8 kg) IBW/kg (Calculated) : 50.1  Temp (24hrs), Avg:98.2 F (36.8 C), Min:98 F (36.7 C), Max:98.5 F (36.9 C)   Recent Labs Lab 12/08/15 1828 12/10/15 0630 12/11/15 0611  WBC 6.1  --  5.1  CREATININE 1.02* 0.91 1.07*    Estimated Creatinine Clearance: 29.8 mL/min (by C-G formula based on SCr of 1.07 mg/dL).    Allergies  Allergen Reactions  . Levaquin [Levofloxacin] Other (See Comments)    Patient stated vision became blurry and became weak.   Antimicrobials this admission: vanc  9/5>> 9/8 Rocephin 9/5 >>9/8 Tressie Ellis 9/8 >>  Recent Results (from the past 240 hour(s))  Aerobic Culture (superficial specimen)     Status: None (Preliminary result)   Collection Time: 12/08/15 11:53 AM  Result Value Ref Range Status   Specimen Description WOUND RIGHT LEG  Final   Special Requests NONE  Final   Gram Stain   Final    FEW WBC PRESENT, PREDOMINANTLY PMN RARE GRAM NEGATIVE RODS    Culture   Final    MODERATE PSEUDOMONAS AERUGINOSA Standardized susceptibility testing for this organism is not available. Performed at [facility]    Report Status PENDING  Incomplete   Thank you for allowing pharmacy to be a part of this patient's care.  Hart Robinsons A 12/11/2015 10:40 AM

## 2015-12-11 NOTE — Evaluation (Signed)
Physical Therapy Evaluation Patient Details Name: Jody [name] MRN: [id number] DOB: 09-14-1927 Today's Date: 12/11/2015   History of Present Illness  82 y.o. female, has had a skin ulcer on the distal right lower ext x 38months.  Pt has been to wound care center who recommended iv abx.  Since oral abx not effective.   Clinical Impression  Pt received in bed, and was agreeable to PT evaluation.  Pt is normally very independent, and still is working at [employer].  Just prior to this admission she was being seen at our [place] [facility] for wound care, and has been recommended for IV antibiotics.  At this point, her strength has diminished from baseline, and she required Min A for sit<>stand and Min guard for ambulation x 64ft with RW.  She demonstrated TTWB on the R LE due to increased pain with full weight bearing.  Unable to visualize wound today due to dressing and pt states the nurse just changed the dressing.  At this point, she is recommended for HHPT vs continued OPPT for continued wound care as well as strength and endurance training to optimize her return work and PLOF.    Follow Up Recommendations Home health PT;Outpatient PT (Pt had been going to OPPT for her wound just prior to her admission.  )    Equipment Recommendations  None recommended by PT (Pt is recommended to use her RW to eliminate pressure on R LE when ambulating)    Recommendations for Other Services       Precautions / Restrictions Precautions Precautions: None Restrictions Weight Bearing Restrictions: No      Mobility  Bed Mobility Overal bed mobility: Modified Independent                Transfers Overall transfer level: Needs assistance Equipment used: Rolling walker (2 wheeled) Transfers: Sit to/from Stand Sit to Stand: Min assist (pt demonstrates diminished power up into standing position. )            Ambulation/Gait Ambulation/Gait assistance: Min guard Ambulation Distance (Feet): 60  Feet Assistive device: Rolling walker (2 wheeled) Gait Pattern/deviations: Step-through pattern     General Gait Details: Pt demonstrating TTWB on the R foot due to wound, which is currently dressed.  Pt expressed feeling weak.    Stairs            Wheelchair Mobility    Modified Rankin (Stroke Patients Only)       Balance Overall balance assessment: Needs assistance         Standing balance support: Bilateral upper extremity supported Standing balance-Leahy Scale: Fair                               Pertinent Vitals/Pain Pain Assessment: No/denies pain    Home Living   Living Arrangements: Alone Available Help at Discharge: Family (dtr calls.) Type of Home: House Home Access: Stairs to enter   Entrance Stairs-Number of Steps: 2 withHR Home Layout: One level Home Equipment: Bedside commode;Cane - single point;Walker - 2 wheels      Prior Function Level of Independence: Independent         Comments: Pt states that she is still driving and able to get out to the grocery store and run errands.  Works full time an [employer].  Pt has been going to OPPT for her wound.       Hand Dominance   Dominant Hand: Right  Extremity/Trunk Assessment   Upper Extremity Assessment: Overall WFL for tasks assessed           Lower Extremity Assessment: Generalized weakness         Communication   Communication: No difficulties  Cognition Arousal/Alertness: Awake/alert Behavior During Therapy: WFL for tasks assessed/performed Overall Cognitive Status: Within Functional Limits for tasks assessed                      General Comments      Exercises        Assessment/Plan    PT Assessment Patient needs continued PT services  PT Diagnosis Difficulty walking;Acute pain   PT Problem List Decreased strength;Decreased activity tolerance;Decreased balance;Decreased mobility;Pain  PT Treatment Interventions DME instruction;Gait  training;Functional mobility training;Therapeutic activities;Therapeutic exercise;Patient/family education   PT Goals (Current goals can be found in the Care Plan section) Acute Rehab PT Goals Patient Stated Goal: Pt wants to go home when she is through with her antibiotics.  PT Goal Formulation: With patient Time For Goal Achievement: 12/18/15 Potential to Achieve Goals: Good    Frequency Min 2X/week   Barriers to discharge Decreased caregiver support Dtr lives 1/2 mile away    Co-evaluation               End of Session Equipment Utilized During Treatment: Gait belt Activity Tolerance: Patient limited by pain Patient left: in bed      Functional Assessment Tool Used: [employer] "6-clicks"  Functional Limitation: Mobility: Walking and moving around Mobility: Walking and Moving Around Current Status [phone]): At least 40 percent but less than 60 percent impaired, limited or restricted Mobility: Walking and Moving Around Goal Status [phone]): At least 20 percent but less than 40 percent impaired, limited or restricted    Time: [id number] PT Time Calculation (min) (ACUTE ONLY): 19 min   Charges:   PT Evaluation $PT Eval Low Complexity: 1 Procedure     PT G Codes:   PT G-Codes **NOT FOR INPATIENT CLASS** Functional Assessment Tool Used: [employer] "6-clicks"  Functional Limitation: Mobility: Walking and moving around Mobility: Walking and Moving Around Current Status [phone]): At least 40 percent but less than 60 percent impaired, limited or restricted Mobility: Walking and Moving Around Goal Status [phone]): At least 20 percent but less than 40 percent impaired, limited or restricted    Beth Kejon Feild, PT, DPT X: [phone]

## 2015-12-11 NOTE — Progress Notes (Signed)
PROGRESS NOTE    Jody [name]  [id number] DOB: 07-05-1927 DOA: 12/08/2015 PCP: Evelina Dun, FNP    Brief Narrative:  46 yof with a hx of afib, HTN, anemia, heart failure, and a right ankle skin ulcer on the distal right lower extremity. Patient was referred to the hospital by her wound care center since her symptoms did not improve with oral antibiotics. She was admitted for further evaluation of cellulitis.  Assessment & Plan:   Active Problems:   Atrial fibrillation (HCC)   HTN (hypertension)   Anemia   Cellulitis  1. Cellulitis. She has a skin ulcer on the distal right lower extremity with surrounding cellulitis. Family reports it has been present for approximately 8 weeks and it has progressively gotten worse. She has completed several rounds of oral antibiotics without significant improvement. MRI of RLE did not show any evidence of deep tissue infection. Wound cultures show pseudomonas. Will discontinue vancomycin and rocephin in favor of ceftazidime. Wound care was consulted and placed a Aquacel to her inner ankle to absorb drainage and santyl to her right outer ankle for enzymic debridement of nonviable tissue. Continue on IV antibiotics. ABIs >1 bilaterally.  2. Chronic A-fib. Rate controlled with Cardizem. She is anticoagulated with eliquis.  3. HTN. Stable. Continue outpatient regimen.  4. Anemia. Hemoglobin is 10.4. Continue to monitor. No evidence of significant bleeding  DVT prophylaxis: Eliquis  Code Status: Full  Family Communication: daughter at bedside Disposition Plan: Discharge home once improved    Consultants:   Wound care   Procedures:   None   Antimicrobials:   Vancomycin 9/5 >>9/8  Rocephin 9/5 >> 9/8   Subjective: Continues to have pain in right leg. Pain is improved with pain medications.  Objective: Vitals:   12/10/15 1920 12/10/15 2025 12/11/15 0400 12/11/15 0556  BP:  (!) 107/49 117/71   Pulse:  (!) 59 75   Resp:  18 18     Temp:  98 F (36.7 C) 98.5 F (36.9 C)   TempSrc:  Oral Oral   SpO2: 92% 91% 96%   Weight:    52.8 kg (116 lb 6.5 oz)  Height:    5\' 2"  (1.575 m)    Intake/Output Summary (Last 24 hours) at 12/11/15 0801 Last data filed at 12/10/15 1800  Gross per 24 hour  Intake              240 ml  Output              150 ml  Net               90 ml   Filed Weights   12/09/15 0443 12/10/15 0500 12/11/15 0556  Weight: 52.1 kg (114 lb 13.8 oz) 51.6 kg (113 lb 12.1 oz) 52.8 kg (116 lb 6.5 oz)    Examination:  General exam: Appears calm and comfortable  Respiratory system: Clear to auscultation. Respiratory effort normal. Cardiovascular system: S1 & S2 heard, RRR. No JVD, murmurs, rubs, gallops or clicks. No pedal edema. Gastrointestinal system: Abdomen is nondistended, soft and nontender. No organomegaly or masses felt. Normal bowel sounds heard. Central nervous system: Alert and oriented. No focal neurological deficits. Extremities: Symmetric 5 x 5 power. Skin: right lower leg with circumferential wound and surrounding erythema Psychiatry: Judgement and insight appear normal. Mood & affect appropriate.     Data Reviewed: I have personally reviewed following labs and imaging studies  CBC:  Recent Labs Lab 12/08/15 1828 12/11/15 [id number]  WBC 6.1 5.1  HGB 10.3* 10.4*  HCT 32.1* 33.3*  MCV 95.0 96.5  PLT 325 [id number]   Basic Metabolic Panel:  Recent Labs Lab 12/08/15 1828 12/10/15 0630 12/11/15 0611  NA 136 135 136  K 3.9 4.3 4.4  CL 99* 96* 101  CO2 27 29 30   GLUCOSE 93 94 85  BUN 17 14 18   CREATININE 1.02* 0.91 1.07*  CALCIUM 9.1 9.0 8.8*   GFR: Estimated Creatinine Clearance: 29.8 mL/min (by C-G formula based on SCr of 1.07 mg/dL). Liver Function Tests:  Recent Labs Lab 12/08/15 1828  AST 15  ALT 12*  ALKPHOS 93  BILITOT 0.4  PROT 6.9  ALBUMIN 3.4*   No results for input(s): LIPASE, AMYLASE in the last 168 hours. No results for input(s): AMMONIA in the last 168  hours. Coagulation Profile: No results for input(s): INR, PROTIME in the last 168 hours. Cardiac Enzymes: No results for input(s): CKTOTAL, CKMB, CKMBINDEX, TROPONINI in the last 168 hours. BNP (last 3 results) No results for input(s): PROBNP in the last 8760 hours. HbA1C: No results for input(s): HGBA1C in the last 72 hours. CBG: No results for input(s): GLUCAP in the last 168 hours. Lipid Profile: No results for input(s): CHOL, HDL, LDLCALC, TRIG, CHOLHDL, LDLDIRECT in the last 72 hours. Thyroid Function Tests: No results for input(s): TSH, T4TOTAL, FREET4, T3FREE, THYROIDAB in the last 72 hours. Anemia Panel: No results for input(s): VITAMINB12, FOLATE, FERRITIN, TIBC, IRON, RETICCTPCT in the last 72 hours. Sepsis Labs: No results for input(s): PROCALCITON, LATICACIDVEN in the last 168 hours.  Recent Results (from the past 240 hour(s))  Aerobic Culture (superficial specimen)     Status: None (Preliminary result)   Collection Time: 12/08/15 11:53 AM  Result Value Ref Range Status   Specimen Description WOUND RIGHT LEG  Final   Special Requests NONE  Final   Gram Stain   Final    FEW WBC PRESENT, PREDOMINANTLY PMN RARE GRAM NEGATIVE RODS    Culture   Final    CULTURE REINCUBATED FOR BETTER GROWTH Performed at [facility]    Report Status PENDING  Incomplete         Radiology Studies: Mr Tibia Fibula Right Wo Contrast  Result Date: 12/09/2015 CLINICAL DATA:  Nonhealing ulcer involving the right ankle. EXAM: MRI OF LOWER RIGHT EXTREMITY WITHOUT CONTRAST TECHNIQUE: Multiplanar, multisequence MR imaging of the right lower extremity was performed. No intravenous contrast was administered. COMPARISON:  None. FINDINGS: There is diffuse subcutaneous soft tissue swelling/edema/fluid involving the right lower extremity consistent with cellulitis. No findings to suggest myofasciitis or pyomyositis. No evidence of septic arthritis or osteomyelitis. Bilateral knee prostheses  are noted. IMPRESSION: MR findings suggest cellulitis but no discrete drainable soft tissue abscess, myofasciitis, pyomyositis or osteomyelitis. Electronically Signed   By: Marijo Sanes M.D.   On: 12/09/2015 17:26   US Arterial Seg Single  Result Date: 12/10/2015 CLINICAL DATA:  Right lower extremity ankle wound for 2 months EXAM: NONINVASIVE PHYSIOLOGIC VASCULAR STUDY OF BILATERAL LOWER EXTREMITIES TECHNIQUE: Evaluation of both lower extremities were performed at rest, including calculation of ankle-brachial indices with single level Doppler, pressure and pulse volume recording. COMPARISON:  None. FINDINGS: Right ABI:  1.15 Left ABI:  1.09 Right Lower Extremity: The posterior tibial Doppler waveform is monophasic. The dorsalis pedis is monophasic. Left Lower Extremity: The posterior tibial and dorsalis pedis arteries are biphasic. IMPRESSION: There is significant bilateral lower extremity arterial occlusive disease worse on the right. Ankle-brachial indices are  greater than 1 likely due to calcified vasculature Electronically Signed   By: Marybelle Killings M.D.   On: 12/10/2015 15:30        Scheduled Meds: . apixaban  2.5 mg Oral BID  . cefTRIAXone (ROCEPHIN)  IV  1 g Intravenous Q24H  . collagenase   Topical Daily  . diltiazem  240 mg Oral Daily  . ferrous gluconate  324 mg Oral Daily  . vancomycin  1,000 mg Intravenous Q48H  . vitamin B-12  1,000 mcg Oral Daily   Continuous Infusions:    LOS: 3 days    Time spent: 25 minutes      Kathie Dike, MD [facility] If 7PM-7AM, please contact night-coverage [email or web address] Password [email or web address] 12/11/2015, 8:01 AM

## 2015-12-12 LAB — AEROBIC CULTURE W GRAM STAIN (SUPERFICIAL SPECIMEN)

## 2015-12-12 LAB — AEROBIC CULTURE  (SUPERFICIAL SPECIMEN)

## 2015-12-12 MED ORDER — FUROSEMIDE 10 MG/ML IJ SOLN: 20.0000 mg | Freq: Once | INTRAMUSCULAR | Status: DC

## 2015-12-12 NOTE — Progress Notes (Signed)
RN notified Dr. Roderic Palau of patient's VS (low BP).  Dr. Roderic Palau gave order to hold dose of Lasix at this time and continue to monitor.

## 2015-12-12 NOTE — Progress Notes (Signed)
PROGRESS NOTE    CASI ALIMI  [id number] DOB: [birth year] DOA: 12/08/2015 PCP: Evelina Dun, FNP    Brief Narrative:  10 yof with a hx of afib, HTN, anemia, heart failure, and a right ankle skin ulcer on the distal right lower extremity. Patient was referred to the hospital by her wound care center since her symptoms did not improve with oral antibiotics. She was admitted for further evaluation of cellulitis.  Assessment & Plan:   Active Problems:   Atrial fibrillation (HCC)   HTN (hypertension)   Anemia   Cellulitis  1. Cellulitis. She has a skin ulcer on the distal right lower extremity with surrounding cellulitis. Family reports it has been present for approximately 8 weeks and it has progressively gotten worse. She has completed several rounds of oral antibiotics without significant improvement. MRI of RLE did not show any evidence of deep tissue infection. Wound cultures show pseudomonas. Vancomycin and rocephin have been discontinued in favor of ceftazidime. Wound care was consulted and placed a Aquacel to her inner ankle to absorb drainage and santyl to her right outer ankle for enzymic debridement of nonviable tissue. Continue on IV antibiotics. ABIs >1 bilaterally. Will give one dose of lasix today to help with LE edema. 2. Chronic A-fib. Rate controlled with Cardizem. She is anticoagulated with eliquis.  3. HTN. Currently stable. Continue outpatient regimen. Monitor carefully. 4. Anemia. Hemoglobin is 10.4. No evidence of significant bleeding. Continue to monitor CBC.  DVT prophylaxis: Eliquis  Code Status: Full  Family Communication: discussed with daughter at the bedside Disposition Plan: Discharge home once improved    Consultants:   Wound care  Physical therapy  Procedures:   None   Antimicrobials:   Vancomycin 9/5 >>9/8  Rocephin 9/5 >> 9/8  Ceftazidime 9/8 >>   Subjective: Pain medications helping leg pain. Still has difficulty bearing weight  on right leg  Objective: Vitals:   12/11/15 1954 12/11/15 2141 12/12/15 0500 12/12/15 0551  BP:  (!) 123/45  (!) 131/52  Pulse:  (!) 54  65  Resp:  20  20  Temp:  98.3 F (36.8 C)  98 F (36.7 C)  TempSrc:  Oral  Oral  SpO2: 91% 93%  93%  Weight:   52 kg (114 lb 11.2 oz)   Height:        Intake/Output Summary (Last 24 hours) at 12/12/15 0711 Last data filed at 12/11/15 2234  Gross per 24 hour  Intake              340 ml  Output              550 ml  Net             -210 ml   Filed Weights   12/10/15 0500 12/11/15 0556 12/12/15 0500  Weight: 51.6 kg (113 lb 12.1 oz) 52.8 kg (116 lb 6.5 oz) 52 kg (114 lb 11.2 oz)    Examination:  General exam: Appears calm and comfortable  Respiratory system: Clear to auscultation. Respiratory effort normal. Cardiovascular system: S1 & S2 heard, RRR. No JVD, murmurs, rubs, gallops or clicks.  Gastrointestinal system: Abdomen is nondistended, soft and nontender. No organomegaly or masses felt. Normal bowel sounds heard. Central nervous system: Alert and oriented. No focal neurological deficits. Extremities: Symmetric 5 x 5 power. Skin: circumferential wound on right lower leg with surrounding erythema. Swelling noted in left foot Psychiatry: Judgement and insight appear normal. Mood & affect appropriate.    Data  Reviewed: I have personally reviewed following labs and imaging studies  CBC:  Recent Labs Lab 12/08/15 1828 12/11/15 0611  WBC 6.1 5.1  HGB 10.3* 10.4*  HCT 32.1* 33.3*  MCV 95.0 96.5  PLT 325 Q000111Q   Basic Metabolic Panel:  Recent Labs Lab 12/08/15 1828 12/10/15 0630 12/11/15 0611  NA 136 135 136  K 3.9 4.3 4.4  CL 99* 96* 101  CO2 27 29 30   GLUCOSE 93 94 85  BUN 17 14 18   CREATININE 1.02* 0.91 1.07*  CALCIUM 9.1 9.0 8.8*   GFR: Estimated Creatinine Clearance: 29.8 mL/min (by C-G formula based on SCr of 1.07 mg/dL). Liver Function Tests:  Recent Labs Lab 12/08/15 1828  AST 15  ALT 12*  ALKPHOS 93    BILITOT 0.4  PROT 6.9  ALBUMIN 3.4*   No results for input(s): LIPASE, AMYLASE in the last 168 hours. No results for input(s): AMMONIA in the last 168 hours. Coagulation Profile: No results for input(s): INR, PROTIME in the last 168 hours. Cardiac Enzymes: No results for input(s): CKTOTAL, CKMB, CKMBINDEX, TROPONINI in the last 168 hours. BNP (last 3 results) No results for input(s): PROBNP in the last 8760 hours. HbA1C: No results for input(s): HGBA1C in the last 72 hours. CBG: No results for input(s): GLUCAP in the last 168 hours. Lipid Profile: No results for input(s): CHOL, HDL, LDLCALC, TRIG, CHOLHDL, LDLDIRECT in the last 72 hours. Thyroid Function Tests: No results for input(s): TSH, T4TOTAL, FREET4, T3FREE, THYROIDAB in the last 72 hours. Anemia Panel: No results for input(s): VITAMINB12, FOLATE, FERRITIN, TIBC, IRON, RETICCTPCT in the last 72 hours. Sepsis Labs: No results for input(s): PROCALCITON, LATICACIDVEN in the last 168 hours.  Recent Results (from the past 240 hour(s))  Aerobic Culture (superficial specimen)     Status: None (Preliminary result)   Collection Time: 12/08/15 11:53 AM  Result Value Ref Range Status   Specimen Description WOUND RIGHT LEG  Final   Special Requests NONE  Final   Gram Stain   Final    FEW WBC PRESENT, PREDOMINANTLY PMN RARE GRAM NEGATIVE RODS    Culture   Final    MODERATE PSEUDOMONAS AERUGINOSA Standardized susceptibility testing for this organism is not available. Performed at [facility]    Report Status PENDING  Incomplete      Radiology Studies: US Arterial Seg Single  Result Date: 12/10/2015 CLINICAL DATA:  Right lower extremity ankle wound for 2 months EXAM: NONINVASIVE PHYSIOLOGIC VASCULAR STUDY OF BILATERAL LOWER EXTREMITIES TECHNIQUE: Evaluation of both lower extremities were performed at rest, including calculation of ankle-brachial indices with single level Doppler, pressure and pulse volume recording.  COMPARISON:  None. FINDINGS: Right ABI:  1.15 Left ABI:  1.09 Right Lower Extremity: The posterior tibial Doppler waveform is monophasic. The dorsalis pedis is monophasic. Left Lower Extremity: The posterior tibial and dorsalis pedis arteries are biphasic. IMPRESSION: There is significant bilateral lower extremity arterial occlusive disease worse on the right. Ankle-brachial indices are greater than 1 likely due to calcified vasculature Electronically Signed   By: Marybelle Killings M.D.   On: 12/10/2015 15:30    Scheduled Meds: . apixaban  2.5 mg Oral BID  . cefTAZidime (FORTAZ)  IV  1 g Intravenous Q12H  . collagenase   Topical Daily  . diltiazem  240 mg Oral Daily  . ferrous gluconate  324 mg Oral Daily  . vitamin B-12  1,000 mcg Oral Daily   Continuous Infusions:    LOS: 4 days  Time spent: 25 minutes    Kathie Dike, MD [facility] If 7PM-7AM, please contact night-coverage [email or web address] Password [email or web address] 12/12/2015, 7:11 AM

## 2015-12-13 LAB — BASIC METABOLIC PANEL
Anion gap: 5 (ref 5–15)
BUN: 16 mg/dL (ref 6–20)
CO2: 32 mmol/L (ref 22–32)
CREATININE: 1.01 mg/dL — AB (ref 0.44–1.00)
Calcium: 8.7 mg/dL — ABNORMAL LOW (ref 8.9–10.3)
Chloride: 97 mmol/L — ABNORMAL LOW (ref 101–111)
GFR calc Af Amer: 57 mL/min — ABNORMAL LOW (ref >60)
GFR, EST NON AFRICAN AMERICAN: 49 mL/min — AB (ref >60)
Glucose, Bld: 118 mg/dL — ABNORMAL HIGH (ref 65–99)
POTASSIUM: 4.3 mmol/L (ref 3.5–5.1)
SODIUM: 134 mmol/L — AB (ref 135–145)

## 2015-12-13 MED ORDER — SODIUM CHLORIDE 0.9 % IV SOLN
INTRAVENOUS | Status: DC
  Administered 2015-12-13: 13:00:00 via INTRAVENOUS

## 2015-12-13 NOTE — Progress Notes (Signed)
Pharmacy Antibiotic Note  Jody [name] is a 82 y.o. female admitted on 12/08/2015 with cellulitis.  Pharmacy has been consulted for FORTAZ dosing.  Pt has small body habitus.  Pseudomonas aeruginosa noted in culture.  Renal dysfunction noted.  Plan: Continue Fortaz 1gm IV q12h F/u renal function, progress, and c/s  Height: 5\' 2"  (157.5 cm) Weight: 124 lb 5.4 oz (56.4 kg) IBW/kg (Calculated) : 50.1  Temp (24hrs), Avg:98.2 F (36.8 C), Min:97.6 F (36.4 C), Max:98.8 F (37.1 C)   Recent Labs Lab 12/08/15 1828 12/10/15 0630 12/11/15 0611  WBC 6.1  --  5.1  CREATININE 1.02* 0.91 1.07*    Estimated Creatinine Clearance: 29.8 mL/min (by C-G formula based on SCr of 1.07 mg/dL).    Allergies  Allergen Reactions  . Levaquin [Levofloxacin] Other (See Comments)    Patient stated vision became blurry and became weak.   Antimicrobials this admission: vanc  9/5>> 9/8 Rocephin 9/5 >>9/8 Tressie Ellis 9/8 >>  Recent Results (from the past 240 hour(s))  Aerobic Culture (superficial specimen)     Status: None   Collection Time: 12/08/15 11:53 AM  Result Value Ref Range Status   Specimen Description WOUND RIGHT LEG  Final   Special Requests NONE  Final   Gram Stain   Final    FEW WBC PRESENT, PREDOMINANTLY PMN RARE GRAM NEGATIVE RODS Performed at [facility]  Final   Report Status 12/12/2015 FINAL  Final   Organism ID, Bacteria PSEUDOMONAS AERUGINOSA  Final      Susceptibility   Pseudomonas aeruginosa - MIC*    CEFTAZIDIME 4 SENSITIVE Sensitive     CIPROFLOXACIN <=0.25 SENSITIVE Sensitive     GENTAMICIN 4 SENSITIVE Sensitive     IMIPENEM 2 SENSITIVE Sensitive     PIP/TAZO 8 SENSITIVE Sensitive     CEFEPIME 2 SENSITIVE Sensitive     * MODERATE PSEUDOMONAS AERUGINOSA   Thank you for allowing pharmacy to be a part of this patient's care.  Hart Robinsons A 12/13/2015 9:17 AM

## 2015-12-13 NOTE — Progress Notes (Signed)
PROGRESS NOTE    Jody [name]  [id number] DOB: 06/20/1927 DOA: 12/08/2015 PCP: Evelina Dun, FNP    Brief Narrative:  89 yof with a hx of afib, HTN, anemia, heart failure, and a right ankle skin ulcer on the distal right lower extremity. Patient was referred to the hospital by her wound care center since her symptoms did not improve with oral antibiotics. She was admitted for further evaluation of cellulitis. MRI of RLE showed no evidence of deep tissue infection. She was initially started on vanc and rocephin, but was transitioned to ceftazidime once wound cultures came back positive for pseudomonas. She will likely need 1 to 2 more days of treatment before being discharged.  Assessment & Plan:   Active Problems:   Atrial fibrillation (HCC)   HTN (hypertension)   Anemia   Cellulitis  1. Cellulitis. She has a skin ulcer on the distal right lower extremity with surrounding cellulitis. Family reports it has been present for approximately 8 weeks and it has progressively gotten worse. She has completed several rounds of oral antibiotics without significant improvement. MRI of RLE did not show any evidence of deep tissue infection. Wound cultures show pseudomonas. Vancomycin and rocephin have been discontinued in favor of ceftazidime. Wound care was consulted and placed a Aquacel to her inner ankle to absorb drainage and santyl to her right outer ankle for enzymic debridement of nonviable tissue. Continue on IV antibiotics. ABIs >1 bilaterally. If continues to improve, anticipate transitioning to oral cipro tomorrow with possible discharge home. 2. Chronic A-fib. Rate controlled with Cardizem. She is anticoagulated with eliquis.  3. HTN. Currently stable. Continue outpatient regimen. Monitor carefully. 4. Anemia. Hemoglobin is 10.4. No evidence of significant bleeding. Continue to monitor CBC.  DVT prophylaxis: Eliquis  Code Status: Full  Family Communication: discussed with daughter at  the bedside Disposition Plan: Discharge home once improved    Consultants:   Wound care  Physical therapy  Procedures:   None   Antimicrobials:   Vancomycin 9/5 >>9/8  Rocephin 9/5 >> 9/8  Ceftazidime 9/8 >>   Subjective: Feels that leg is improving. Continues to have pain in wound  Objective: Vitals:   12/12/15 1652 12/12/15 2141 12/12/15 2249 12/13/15 0556  BP: (!) 109/48 (!) 107/46  132/61  Pulse: 60 61  72  Resp: 18 18  18   Temp:  98.2 F (36.8 C)  97.6 F (36.4 C)  TempSrc:  Oral  Oral  SpO2: 92% 93% 94% 95%  Weight:    56.4 kg (124 lb 5.4 oz)  Height:        Intake/Output Summary (Last 24 hours) at 12/13/15 0613 Last data filed at 12/13/15 0556  Gross per 24 hour  Intake              480 ml  Output             1000 ml  Net             -520 ml   Filed Weights   12/11/15 0556 12/12/15 0500 12/13/15 0556  Weight: 52.8 kg (116 lb 6.5 oz) 52 kg (114 lb 11.2 oz) 56.4 kg (124 lb 5.4 oz)    Examination:   General exam: Appears calm and comfortable  Respiratory system: Clear to auscultation. Respiratory effort normal. Cardiovascular system: S1 & S2 heard, RRR. No JVD, murmurs, rubs, gallops or clicks. No pedal edema. Gastrointestinal system: Abdomen is nondistended, soft and nontender. No organomegaly or masses felt. Normal bowel sounds  heard. Central nervous system: Alert and oriented. No focal neurological deficits. Extremities: Symmetric 5 x 5 power. Skin: wound on RLE slowly improving, still has surrounding erythema and swelling Psychiatry: Judgement and insight appear normal. Mood & affect appropriate.    Data Reviewed: I have personally reviewed following labs and imaging studies  CBC:  Recent Labs Lab 12/08/15 1828 12/11/15 0611  WBC 6.1 5.1  HGB 10.3* 10.4*  HCT 32.1* 33.3*  MCV 95.0 96.5  PLT 325 Q000111Q   Basic Metabolic Panel:  Recent Labs Lab 12/08/15 1828 12/10/15 0630 12/11/15 0611  NA 136 135 136  K 3.9 4.3 4.4  CL 99*  96* 101  CO2 27 29 30   GLUCOSE 93 94 85  BUN 17 14 18   CREATININE 1.02* 0.91 1.07*  CALCIUM 9.1 9.0 8.8*   GFR: Estimated Creatinine Clearance: 29.8 mL/min (by C-G formula based on SCr of 1.07 mg/dL). Liver Function Tests:  Recent Labs Lab 12/08/15 1828  AST 15  ALT 12*  ALKPHOS 93  BILITOT 0.4  PROT 6.9  ALBUMIN 3.4*   No results for input(s): LIPASE, AMYLASE in the last 168 hours. No results for input(s): AMMONIA in the last 168 hours. Coagulation Profile: No results for input(s): INR, PROTIME in the last 168 hours. Cardiac Enzymes: No results for input(s): CKTOTAL, CKMB, CKMBINDEX, TROPONINI in the last 168 hours. BNP (last 3 results) No results for input(s): PROBNP in the last 8760 hours. HbA1C: No results for input(s): HGBA1C in the last 72 hours. CBG: No results for input(s): GLUCAP in the last 168 hours. Lipid Profile: No results for input(s): CHOL, HDL, LDLCALC, TRIG, CHOLHDL, LDLDIRECT in the last 72 hours. Thyroid Function Tests: No results for input(s): TSH, T4TOTAL, FREET4, T3FREE, THYROIDAB in the last 72 hours. Anemia Panel: No results for input(s): VITAMINB12, FOLATE, FERRITIN, TIBC, IRON, RETICCTPCT in the last 72 hours. Sepsis Labs: No results for input(s): PROCALCITON, LATICACIDVEN in the last 168 hours.  Recent Results (from the past 240 hour(s))  Aerobic Culture (superficial specimen)     Status: None   Collection Time: 12/08/15 11:53 AM  Result Value Ref Range Status   Specimen Description WOUND RIGHT LEG  Final   Special Requests NONE  Final   Gram Stain   Final    FEW WBC PRESENT, PREDOMINANTLY PMN RARE GRAM NEGATIVE RODS Performed at [place]  Final   Report Status 12/12/2015 FINAL  Final   Organism ID, Bacteria PSEUDOMONAS AERUGINOSA  Final      Susceptibility   Pseudomonas aeruginosa - MIC*    CEFTAZIDIME 4 SENSITIVE Sensitive     CIPROFLOXACIN <=0.25 SENSITIVE Sensitive      GENTAMICIN 4 SENSITIVE Sensitive     IMIPENEM 2 SENSITIVE Sensitive     PIP/TAZO 8 SENSITIVE Sensitive     CEFEPIME 2 SENSITIVE Sensitive     * MODERATE PSEUDOMONAS AERUGINOSA     Radiology Studies: No results found.  Scheduled Meds: . apixaban  2.5 mg Oral BID  . cefTAZidime (FORTAZ)  IV  1 g Intravenous Q12H  . collagenase   Topical Daily  . diltiazem  240 mg Oral Daily  . ferrous gluconate  324 mg Oral Daily  . furosemide  20 mg Intravenous Once  . vitamin B-12  1,000 mcg Oral Daily   Continuous Infusions:    LOS: 5 days   Time spent: 25 minutes    Kathie Dike, MD [facility] If 7PM-7AM, please contact night-coverage  [email or web address] Password [email or web address] 12/13/2015, 6:13 AM

## 2015-12-13 NOTE — Progress Notes (Signed)
Pt resting in bed. Pain controlled as of now, relief from percocet two tabs per order. Will continue to monitor.

## 2015-12-14 ENCOUNTER — Telehealth (HOSPITAL_COMMUNITY): Payer: Self-pay

## 2015-12-14 LAB — BASIC METABOLIC PANEL
ANION GAP: 7 (ref 5–15)
BUN: 15 mg/dL (ref 6–20)
CO2: 29 mmol/L (ref 22–32)
Calcium: 8.9 mg/dL (ref 8.9–10.3)
Chloride: 99 mmol/L — ABNORMAL LOW (ref 101–111)
Creatinine, Ser: 1.02 mg/dL — ABNORMAL HIGH (ref 0.44–1.00)
GFR, EST AFRICAN AMERICAN: 56 mL/min — AB (ref >60)
GFR, EST NON AFRICAN AMERICAN: 48 mL/min — AB (ref >60)
GLUCOSE: 84 mg/dL (ref 65–99)
POTASSIUM: 4.8 mmol/L (ref 3.5–5.1)
Sodium: 135 mmol/L (ref 135–145)

## 2015-12-14 MED ORDER — OXYCODONE-ACETAMINOPHEN 5-325 MG PO TABS: 1.0000 | ORAL_TABLET | Freq: Four times a day (QID) | ORAL | 0 refills | Status: DC | PRN

## 2015-12-14 MED ORDER — COLLAGENASE 250 UNIT/GM EX OINT: TOPICAL_OINTMENT | Freq: Every day | CUTANEOUS | 0 refills | Status: DC

## 2015-12-14 MED ORDER — CIPROFLOXACIN HCL 500 MG PO TABS: 500.0000 mg | ORAL_TABLET | Freq: Two times a day (BID) | ORAL | 0 refills | Status: DC

## 2015-12-14 NOTE — Progress Notes (Addendum)
Assisted PT to car via wheelchair in stable condition for discharge and assisted into daughter's car. Pt had unsteady gait. Pt claimed and packed all belongings upon discharge. Daughter and PT educated on prescriptions, wound care via home health and follow up appointment. Discharge instructions and care notes given and  PT and daughter verbalized understanding. IV removed with catheter intact.

## 2015-12-14 NOTE — Telephone Encounter (Signed)
12/14/15 caller left a message just stating that patient couldnd't come in and didn't leave a reason

## 2015-12-14 NOTE — Care Management Important Message (Signed)
Important Message  Patient Details  Name: Jody [name] MRN: [id number] Date of Birth: [birth year]   Medicare Important Message Given:  Yes    Melane Windholz, Chauncey Reading, RN 12/14/2015, 9:40 AM

## 2015-12-14 NOTE — Discharge Summary (Signed)
Physician Discharge Summary  Jody [name] [id number] DOB: [birth year] DOA: 12/08/2015  PCP: Evelina Dun, FNP  Admit date: 12/08/2015 Discharge date: 12/14/2015  Admitted From: Home  Disposition:  Home  Recommendations for Outpatient Follow-up:  1. Follow up with PCP in 1-2 weeks 2. Please obtain BMP/CBC in one week  Home Health: No Equipment/Devices: No  Discharge Condition: Stable CODE STATUS: Full Diet recommendation: Heart Healthy   Brief/Interim Summary: 50 yof with a hx of afib, HTN, anemia, heart failure, and a right ankle skin ulcer on the distal right lower extremity. Patient was referred to the hospital by her wound care center since her symptoms did not improve with oral antibiotics. She was admitted for further evaluation of cellulitis. MRI of RLE showed no evidence of deep tissue infection. She was initially started on vanc and rocephin, but was transitioned to ceftazidime once wound cultures came back positive for pseudomonas.  Discharge Diagnoses:  Active Problems:   Atrial fibrillation (HCC)   HTN (hypertension)   Anemia   Cellulitis  1. Cellulitis. She had a skin ulcer on the distal right lower extremity with surrounding cellulitis. Family reports it had been present for approximately 8 weeks and it had progressively gotten worse. She had completed several rounds of oral antibiotics without significant improvement. MRI of RLE did not show any evidence of deep tissue infection. Wound cultures show pseudomonas. She was initially treated with Vancomycin and rocephin, but then they were discontinued in favor of ceftazidime. Wound care was consulted and placed a Aquacel to her inner ankle to absorb drainage and santyl to her right outer ankle for enzymic debridement of nonviable tissue. ABIs >1 bilaterally. In allergies, levofloxacin was listed as a potential allergy. Her outpatient pharmacy was contact and reports the took ciprofloxacin approximately 2 months ago without  any issues. Her IV antibiotics were transitioned to ciprofloxacin to complete the course. She has been set up with home health RN for dressing changes. 2. Chronic A-fib. Rate controlled with Cardizem. She is anticoagulated with eliquis.  3. HTN. Currently stable. Continue outpatient regimen. Monitor carefully. 4. Anemia. Hemoglobin is 10.4. No evidence of significant bleeding. Continue to monitor CBC.  Discharge Instructions  Discharge Instructions    Diet - low sodium heart healthy    Complete by:  As directed   Increase activity slowly    Complete by:  As directed       Medication List    STOP taking these medications   doxycycline 100 MG tablet Commonly known as:  VIBRA-TABS     TAKE these medications   ciprofloxacin 500 MG tablet Commonly known as:  CIPRO Take 1 tablet (500 mg total) by mouth 2 (two) times daily.   collagenase ointment Commonly known as:  SANTYL Apply topically daily.   diltiazem 240 MG 24 hr capsule Commonly known as:  CARDIZEM CD TAKE ONE CAPSULE BY MOUTH ONCE DAILY   ELIQUIS 2.5 MG Tabs tablet Generic drug:  apixaban TAKE ONE TABLET BY MOUTH TWICE DAILY   furosemide 20 MG tablet Commonly known as:  LASIX Take 1 tablet (20 mg total) by mouth daily as needed for edema. Pt is taking one pill by mouth every two weeks.   IRON (FERROUS GLUCONATE) PO Take 27 mg by mouth.   mupirocin ointment 2 % Commonly known as:  BACTROBAN Apply 1 application topically 2 (two) times daily.   oxyCODONE-acetaminophen 5-325 MG tablet Commonly known as:  PERCOCET/ROXICET Take 1-2 tablets by mouth every 6 (six) hours as needed  for severe pain.   traMADol 50 MG tablet Commonly known as:  ULTRAM Take 1-2 tablets (50-100 mg total) by mouth every 8 (eight) hours as needed.   vitamin B-12 1000 MCG tablet Commonly known as:  CYANOCOBALAMIN Take 1,000 mcg by mouth daily.      Follow-up Information    [facility] .   Why:  arranging RN and  PT Contact information: [place] [address] [phone]        Evelina Dun, FNP Follow up on 12/25/2015.   Specialty:  Family Medicine Why:  At 2:25pm Contact information: [place] [address] [phone]          Allergies  Allergen Reactions  . Levaquin [Levofloxacin] Other (See Comments)    Patient stated vision became blurry and became weak.    Consultations:  Wound care   Physical therapy    Procedures/Studies: Mr Tibia Fibula Right Wo Contrast  Result Date: 12/09/2015 CLINICAL DATA:  Nonhealing ulcer involving the right ankle. EXAM: MRI OF LOWER RIGHT EXTREMITY WITHOUT CONTRAST TECHNIQUE: Multiplanar, multisequence MR imaging of the right lower extremity was performed. No intravenous contrast was administered. COMPARISON:  None. FINDINGS: There is diffuse subcutaneous soft tissue swelling/edema/fluid involving the right lower extremity consistent with cellulitis. No findings to suggest myofasciitis or pyomyositis. No evidence of septic arthritis or osteomyelitis. 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 IMPRESSION: MR findings suggest cellulitis but no discrete drainable soft tissue abscess, myofasciitis, pyomyositis or osteomyelitis. Electronically Signed   By: Marijo Sanes M.D.   On: 12/09/2015 17:26   US Arterial Seg Single  Result Date: 12/10/2015 CLINICAL DATA:  Right lower extremity ankle wound for 2 months EXAM: NONINVASIVE PHYSIOLOGIC VASCULAR STUDY OF BILATERAL LOWER EXTREMITIES TECHNIQUE: Evaluation of both lower extremities were performed at rest, including calculation of ankle-brachial indices with single level Doppler, pressure and pulse volume recording. COMPARISON:  None. FINDINGS: Right ABI:  1.15 Left ABI:  1.09 Right Lower Extremity: The posterior tibial Doppler waveform is monophasic. The dorsalis pedis is monophasic. Left Lower Extremity: The posterior tibial and dorsalis pedis arteries are biphasic.  IMPRESSION: There is significant bilateral lower extremity arterial occlusive disease worse on the right. Ankle-brachial indices are greater than 1 likely due to calcified vasculature Electronically Signed   By: Marybelle Killings M.D.   On: 12/10/2015 15:30      Subjective: Feeling better. Still has some pain in right foot. No shortness of breath. Feels generally weak.  Discharge Exam: Vitals:   12/13/15 2151 12/14/15 0600  BP: (!) 107/52 (!) 126/58  Pulse: 68 63  Resp: 18 18  Temp: 98.7 F (37.1 C) 98.7 F (37.1 C)   Vitals:   12/13/15 0556 12/13/15 1411 12/13/15 2151 12/14/15 0600  BP: 132/61 (!) 110/47 (!) 107/52 (!) 126/58  Pulse: 72 65 68 63  Resp: 18 18 18 18   Temp: 97.6 F (36.4 C) 98.3 F (36.8 C) 98.7 F (37.1 C) 98.7 F (37.1 C)  TempSrc: Oral Oral Oral Oral  SpO2: 95% 92% 97% 98%  Weight: 56.4 kg (124 lb 5.4 oz)   55 kg (121 lb 4.1 oz)  Height:        General: Pt is alert, awake, not in acute distress Cardiovascular: RRR, S1/S2 +, no rubs, no gallops Respiratory: CTA bilaterally, no wheezing, no rhonchi Abdominal: Soft, NT, ND, bowel sounds + Extremities: right foot wound with surrounding erythema, draining clear fluid. Wound has yellow base and has decreased in size.  There is some swelling in the right foot    The results of significant diagnostics from this hospitalization (including imaging, microbiology, ancillary and laboratory) are listed below for reference.     Microbiology: Recent Results (from the past 240 hour(s))  Aerobic Culture (superficial specimen)     Status: None   Collection Time: 12/08/15 11:53 AM  Result Value Ref Range Status   Specimen Description WOUND RIGHT LEG  Final   Special Requests NONE  Final   Gram Stain   Final    FEW WBC PRESENT, PREDOMINANTLY PMN RARE GRAM NEGATIVE RODS Performed at [place]  Final   Report Status 12/12/2015 FINAL  Final   Organism ID, Bacteria  PSEUDOMONAS AERUGINOSA  Final      Susceptibility   Pseudomonas aeruginosa - MIC*    CEFTAZIDIME 4 SENSITIVE Sensitive     CIPROFLOXACIN <=0.25 SENSITIVE Sensitive     GENTAMICIN 4 SENSITIVE Sensitive     IMIPENEM 2 SENSITIVE Sensitive     PIP/TAZO 8 SENSITIVE Sensitive     CEFEPIME 2 SENSITIVE Sensitive     * MODERATE PSEUDOMONAS AERUGINOSA     Labs: BNP (last 3 results) No results for input(s): BNP in the last 8760 hours. Basic Metabolic Panel:  Recent Labs Lab 12/08/15 1828 12/10/15 0630 12/11/15 0611 12/13/15 1113 12/14/15 0611  NA 136 135 136 134* 135  K 3.9 4.3 4.4 4.3 4.8  CL 99* 96* 101 97* 99*  CO2 27 29 30  32 29  GLUCOSE 93 94 85 118* 84  BUN 17 14 18 16 15   CREATININE 1.02* 0.91 1.07* 1.01* 1.02*  CALCIUM 9.1 9.0 8.8* 8.7* 8.9   Liver Function Tests:  Recent Labs Lab 12/08/15 1828  AST 15  ALT 12*  ALKPHOS 93  BILITOT 0.4  PROT 6.9  ALBUMIN 3.4*   No results for input(s): LIPASE, AMYLASE in the last 168 hours. No results for input(s): AMMONIA in the last 168 hours. CBC:  Recent Labs Lab 12/08/15 1828 12/11/15 0611  WBC 6.1 5.1  HGB 10.3* 10.4*  HCT 32.1* 33.3*  MCV 95.0 96.5  PLT 325 327   Cardiac Enzymes: No results for input(s): CKTOTAL, CKMB, CKMBINDEX, TROPONINI in the last 168 hours. BNP: Invalid input(s): POCBNP CBG: No results for input(s): GLUCAP in the last 168 hours. D-Dimer No results for input(s): DDIMER in the last 72 hours. Hgb A1c No results for input(s): HGBA1C in the last 72 hours. Lipid Profile No results for input(s): CHOL, HDL, LDLCALC, TRIG, CHOLHDL, LDLDIRECT in the last 72 hours. Thyroid function studies No results for input(s): TSH, T4TOTAL, T3FREE, THYROIDAB in the last 72 hours.  Invalid input(s): FREET3 Anemia work up No results for input(s): VITAMINB12, FOLATE, FERRITIN, TIBC, IRON, RETICCTPCT in the last 72 hours. Urinalysis No results found for: COLORURINE, APPEARANCEUR, [place], [place], [place],  [place], [place], [place], PROTEINUR, UROBILINOGEN, NITRITE, LEUKOCYTESUR Sepsis Labs Invalid input(s): PROCALCITONIN,  WBC,  LACTICIDVEN Microbiology Recent Results (from the past 240 hour(s))  Aerobic Culture (superficial specimen)     Status: None   Collection Time: 12/08/15 11:53 AM  Result Value Ref Range Status   Specimen Description WOUND RIGHT LEG  Final   Special Requests NONE  Final   Gram Stain   Final    FEW WBC PRESENT, PREDOMINANTLY PMN RARE GRAM NEGATIVE RODS Performed at [facility]  Final   Report Status 12/12/2015 FINAL  Final  Organism ID, Bacteria PSEUDOMONAS AERUGINOSA  Final      Susceptibility   Pseudomonas aeruginosa - MIC*    CEFTAZIDIME 4 SENSITIVE Sensitive     CIPROFLOXACIN <=0.25 SENSITIVE Sensitive     GENTAMICIN 4 SENSITIVE Sensitive     IMIPENEM 2 SENSITIVE Sensitive     PIP/TAZO 8 SENSITIVE Sensitive     CEFEPIME 2 SENSITIVE Sensitive     * MODERATE PSEUDOMONAS AERUGINOSA     Time coordinating discharge: Over 30 minutes  SIGNED:   Kathie Dike, MD [facility] 12/14/2015, 6:36 PM If 7PM-7AM, please contact night-coverage [email or web address] Password [email or web address]

## 2015-12-14 NOTE — Care Management Note (Signed)
Case Management Note  Patient Details  Name: Jody [name] MRN: [id number] Date of Birth: 1928/01/14  Expected Discharge Date:        12/15/2015          Expected Discharge Plan:  Home/Self Care  In-House Referral:  NA  Discharge planning Services  CM Consult  Post Acute Care Choice:  Home Health Choice offered to:  Patient, Adult Children  DME Arranged:    DME Agency:     HH Arranged:  RN, PT [place] Agency:  [facility]  Status of Service:  Completed, signed off  If discussed at [place] of Stay Meetings, dates discussed:    Additional Comments: Patient will need HHPT and RN at discharge. Offered choice. Romualdo Bolk of [facility] notified and will obtain orders from chart. Patient and her daughter made aware that [facility] has 48 hours to make first visit.   Tamyah Cutbirth, Chauncey [place], RN 12/14/2015, 11:10 AM

## 2015-12-15 ENCOUNTER — Ambulatory Visit (HOSPITAL_COMMUNITY): Payer: Medicare Other | Admitting: Physical Therapy

## 2015-12-15 DIAGNOSIS — L03115 Cellulitis of right lower limb: Secondary | ICD-10-CM | POA: Diagnosis not present

## 2015-12-15 DIAGNOSIS — I5032 Chronic diastolic (congestive) heart failure: Secondary | ICD-10-CM | POA: Diagnosis not present

## 2015-12-15 DIAGNOSIS — Z792 Long term (current) use of antibiotics: Secondary | ICD-10-CM | POA: Diagnosis not present

## 2015-12-15 DIAGNOSIS — I720 Aneurysm of carotid artery: Secondary | ICD-10-CM | POA: Diagnosis not present

## 2015-12-15 DIAGNOSIS — I2510 Atherosclerotic heart disease of native coronary artery without angina pectoris: Secondary | ICD-10-CM | POA: Diagnosis not present

## 2015-12-15 DIAGNOSIS — Z96652 Presence of left artificial knee joint: Secondary | ICD-10-CM | POA: Diagnosis not present

## 2015-12-15 DIAGNOSIS — I110 Hypertensive heart disease with heart failure: Secondary | ICD-10-CM | POA: Diagnosis not present

## 2015-12-15 DIAGNOSIS — Z79891 Long term (current) use of opiate analgesic: Secondary | ICD-10-CM | POA: Diagnosis not present

## 2015-12-15 DIAGNOSIS — L97219 Non-pressure chronic ulcer of right calf with unspecified severity: Secondary | ICD-10-CM | POA: Diagnosis not present

## 2015-12-15 DIAGNOSIS — I872 Venous insufficiency (chronic) (peripheral): Secondary | ICD-10-CM | POA: Diagnosis not present

## 2015-12-15 DIAGNOSIS — Z8679 Personal history of other diseases of the circulatory system: Secondary | ICD-10-CM | POA: Diagnosis not present

## 2015-12-15 DIAGNOSIS — Z7902 Long term (current) use of antithrombotics/antiplatelets: Secondary | ICD-10-CM | POA: Diagnosis not present

## 2015-12-15 DIAGNOSIS — L97419 Non-pressure chronic ulcer of right heel and midfoot with unspecified severity: Secondary | ICD-10-CM | POA: Diagnosis not present

## 2015-12-15 DIAGNOSIS — I4891 Unspecified atrial fibrillation: Secondary | ICD-10-CM | POA: Diagnosis not present

## 2015-12-15 MED ORDER — TRAMADOL HCL 50 MG PO TABS: 50.0000 mg | ORAL_TABLET | Freq: Three times a day (TID) | ORAL | 2 refills | Status: DC | PRN

## 2015-12-15 NOTE — Telephone Encounter (Signed)
Please call in Ultram

## 2015-12-15 NOTE — Telephone Encounter (Signed)
Per Alyse Low, called in Ultram 50 mg, take 1 - 2 every 8 hours as needed for pain, #60 with 2 refills, to [place].

## 2015-12-16 ENCOUNTER — Telehealth: Payer: Self-pay | Admitting: *Deleted

## 2015-12-16 DIAGNOSIS — L97219 Non-pressure chronic ulcer of right calf with unspecified severity: Secondary | ICD-10-CM | POA: Diagnosis not present

## 2015-12-16 DIAGNOSIS — I4891 Unspecified atrial fibrillation: Secondary | ICD-10-CM | POA: Diagnosis not present

## 2015-12-16 DIAGNOSIS — I5032 Chronic diastolic (congestive) heart failure: Secondary | ICD-10-CM | POA: Diagnosis not present

## 2015-12-16 DIAGNOSIS — I2510 Atherosclerotic heart disease of native coronary artery without angina pectoris: Secondary | ICD-10-CM | POA: Diagnosis not present

## 2015-12-16 DIAGNOSIS — Z8679 Personal history of other diseases of the circulatory system: Secondary | ICD-10-CM | POA: Diagnosis not present

## 2015-12-16 DIAGNOSIS — L03115 Cellulitis of right lower limb: Secondary | ICD-10-CM | POA: Diagnosis not present

## 2015-12-16 DIAGNOSIS — Z7902 Long term (current) use of antithrombotics/antiplatelets: Secondary | ICD-10-CM | POA: Diagnosis not present

## 2015-12-16 DIAGNOSIS — I720 Aneurysm of carotid artery: Secondary | ICD-10-CM | POA: Diagnosis not present

## 2015-12-16 DIAGNOSIS — Z79891 Long term (current) use of opiate analgesic: Secondary | ICD-10-CM | POA: Diagnosis not present

## 2015-12-16 DIAGNOSIS — I110 Hypertensive heart disease with heart failure: Secondary | ICD-10-CM | POA: Diagnosis not present

## 2015-12-16 DIAGNOSIS — Z96652 Presence of left artificial knee joint: Secondary | ICD-10-CM | POA: Diagnosis not present

## 2015-12-16 DIAGNOSIS — I872 Venous insufficiency (chronic) (peripheral): Secondary | ICD-10-CM | POA: Diagnosis not present

## 2015-12-16 DIAGNOSIS — Z792 Long term (current) use of antibiotics: Secondary | ICD-10-CM | POA: Diagnosis not present

## 2015-12-16 DIAGNOSIS — L97419 Non-pressure chronic ulcer of right heel and midfoot with unspecified severity: Secondary | ICD-10-CM | POA: Diagnosis not present

## 2015-12-16 NOTE — Telephone Encounter (Signed)
Call Completed and Appointment Scheduled: Yes, Date: 12/25/15 with Evelina Dun, FNP   DISCHARGE INFORMATION Date of Discharge:12/14/15  Discharge Facility: [facility]  Principal Discharge Diagnosis: cellulitis  Patient and/or caregiver is knowledgeable of his/her condition(s) and treatment: Yes  MEDICATION RECONCILIATION Current medication list reviewed with patient:Yes Discharge Medications reviewed and reconciled with current medications.yes  Patient is able to obtain needed medications:Yes  ACTIVITIES OF DAILY LIVING  Is the patient able to perform his/her own ADLs: Yes.    Patient is receiving home health services: Yes.    PATIENT EDUCATION Questions/Concerns Discussed: Patient has 3 doses of cipro left. She has been taking her meds without any complications. Home health is coming out for PT and wound care. She has an appt with her wound care specialist tomorrow and will keep that appt. Advised of appt with Evelina Dun, FNP on 12/25/15. She will call back if she needs any assistance before then.

## 2015-12-17 ENCOUNTER — Ambulatory Visit (HOSPITAL_COMMUNITY): Payer: Medicare Other | Admitting: Physical Therapy

## 2015-12-17 DIAGNOSIS — R262 Difficulty in walking, not elsewhere classified: Secondary | ICD-10-CM | POA: Diagnosis not present

## 2015-12-17 DIAGNOSIS — L03115 Cellulitis of right lower limb: Secondary | ICD-10-CM | POA: Diagnosis not present

## 2015-12-17 DIAGNOSIS — M25571 Pain in right ankle and joints of right foot: Secondary | ICD-10-CM

## 2015-12-17 DIAGNOSIS — Z8679 Personal history of other diseases of the circulatory system: Secondary | ICD-10-CM | POA: Diagnosis not present

## 2015-12-17 DIAGNOSIS — L97319 Non-pressure chronic ulcer of right ankle with unspecified severity: Secondary | ICD-10-CM

## 2015-12-17 DIAGNOSIS — I83013 Varicose veins of right lower extremity with ulcer of ankle: Secondary | ICD-10-CM | POA: Diagnosis not present

## 2015-12-17 NOTE — Therapy (Signed)
[place], [place], [address] Phone: [phone]   Fax:  [phone]  Wound Care Therapy  Patient Details  Name: Jody [name] MRN: [id number] Date of Birth: 04-11-1927 Referring Provider: Sharion Balloon   Encounter Date: 12/17/2015      PT End of Session - 12/17/15 1028    Visit Number 3   Number of Visits 12   Date for PT Re-Evaluation 01/02/16   Authorization Type [place] - Visit Number 3   Authorization - Number of Visits 10   PT Start Time 0818   PT Stop Time 0850   PT Time Calculation (min) 32 min   Activity Tolerance Patient limited by pain   Behavior During Therapy [facility] for tasks assessed/performed      Past Medical History:  Diagnosis Date  . Afib ([place])   . Anemia   . CAD (coronary artery disease)   . Chronic diastolic heart failure ([place])   . HTN (hypertension)   . Mitral regurgitation    Moderate, echo, 07/2012  . Pulmonary hypertension ([place])     Past Surgical History:  Procedure Laterality Date  . JOINT REPLACEMENT Right Feb 2015   Dr. Case- [facility] Noss  . TOTAL KNEE ARTHROPLASTY Left     There were no vitals filed for this visit.       Subjective Assessment - 12/17/15 1028    Subjective Pt was sent to ED after last session on 9/5 and was Admitted to hospital due to acute cellulitis.  Wound was cultured and found to contain pseudomonas.  Antibiotic was changed and added santyl dressing to help debride the wound.  Pt was in hospital X 6 days and discharged on 12/14/15.                   Wound Therapy - 12/17/15 1014    Subjective CG reports she is overall better but still sensitive.  STates she is re-dressing her wound daily and using the santyl given to her at the hospital.  She forgot to bring the santyl this session.    Patient and Family Stated Goals For the wound to heal    Date of Onset 10/22/15   Prior Treatments self care and antibiotics    Pain  Assessment 0-10   Pain Score 5    Pain Type Acute pain   Pain Location Ankle   Pain Orientation Right   Pain Descriptors / Indicators Aching;Burning   Patients Stated Pain Goal 0   Pain Intervention(s) Elevated extremity;Repositioned;Medication (See eMAR)   Evaluation and Treatment Procedures Explained to Patient/Family --   Evaluation and Treatment Procedures --   Wound Properties Date First Assessed: 12/03/15 Time First Assessed: 1030 Wound Type: Other (Comment) Location: Ankle Location Orientation: Right Wound Description (Comments): medical aspect    Dressing Type Gauze (Comment);Hydrogel   Dressing Changed Changed   Dressing Status Old drainage   Dressing Change Frequency PRN   Site / Wound Assessment Friable;Yellow   % Wound base Red or Granulating 90%   % Wound base Yellow 10%   Peri-wound Assessment Erythema (blanchable)   Drainage Amount Minimal   Drainage Description No odor;Serosanguineous   Treatment Cleansed;Debridement (Selective)   Wound Properties Date First Assessed: 12/03/15 Time First Assessed: 0951 Wound Type: Other (Comment) Location: Ankle Location Orientation: Right Wound Description (Comments): lateral aspect Present on Admission: Yes   Dressing Type Gauze (Comment);Hydrogel   Dressing Changed Changed   Dressing  Status Old drainage   Dressing Change Frequency PRN   Site / Wound Assessment Friable;Painful;Yellow   % Wound base Red or Granulating 25%   % Wound base Yellow 75%   Peri-wound Assessment Edema   Wound Length (cm) 7 cm  was 8 cm   Wound Width (cm) 5 cm  was 7 cm   Wound Depth (cm) 0.2 cm  was 0.2 cm   Drainage Amount Minimal   Drainage Description Serosanguineous;No odor   Treatment Cleansed;Debridement (Selective)   Wound Therapy - [place] has overall thinned since using santyl in hospital.  Able to remove large amount from lateral wound, concentrating on the wound borders.  Removed alot of dry skin and slough from medial  side as well with overall improvement in granulation noted.  PT able to tolerate debridement well.  Used hydrogel to dress wounds this session as patient did not bring her santyl with her.  Pt reported overall comfort.   Wound Therapy - Functional Problem List pain limiting walking,    Factors Delaying/Impairing Wound Healing Infection - systemic/local;Vascular compromise   Hydrotherapy Plan Debridement;Dressing change;Patient/family education   Wound Therapy - Frequency --  2x week x 6 weeks.    Wound Therapy - Current Recommendations PT   Wound Plan Pt education, wound care inclucding cleansing, debridement and multilayer dressing changes.    Dressing  Vaseline to perimeter, hydrogel gauze to wounds, kling and #3 netting                 PT Education - 12/17/15 1027    Education provided Yes   Education Details Educated CG to only apply santyl to yellow area on lateral ankle and medial ankle no longer needs santyl and only apply moisturizer. Educated to watch for returned redness and signs of infection   Person(s) Educated Patient;Child(ren)   Methods Explanation;Demonstration   Comprehension Verbalized understanding          PT Short Term Goals - 12/17/15 1135      PT SHORT TERM GOAL #1   Title Pt pain in her right ankle to be no greater than a 5/10 to allow pt to be on her feet for more than 30 minutes.   Time 3   Period Weeks   Status Achieved     PT SHORT TERM GOAL #2   Title Pt drainage from wounds on right ankle to be scant to decrease risk of infection    Time 3   Period Weeks   Status On-going     PT SHORT TERM GOAL #3   Title Pt wound size to have decreased by 3 cm x 3 cm to decrease risk of infection    Time 3   Period Weeks   Status On-going           PT Long Term Goals - 12/17/15 1136      PT LONG TERM GOAL #1   Title Pt pain level to be no greater than a 2/10 to allow pt to be able to be on her feet for up to 40 minutes for work tasks.     Time 6   Period Weeks   Status On-going     PT LONG TERM GOAL #2   Title Pt wounds to be healed    Time 6   Period Weeks   Status On-going     PT LONG TERM GOAL #3   Title Pt to be wearing compression garments everyday and to verbalize the  importance of why you need to wear compression when you have varicosities.     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 Cellulitis 12/08/2015  . (HFpEF) heart failure with preserved ejection fraction ([place]) 04/16/2013  . Mitral regurgitation   . Anemia   . HTN (hypertension)   . Atrial fibrillation ([place]) 04/02/2009  . DIASTOLIC HEART FAILURE, CHRONIC 04/02/2009    Teena Irani, PTA/CLT [phone]  12/17/2015, 11:36 AM Rayetta Humphrey, PT CLT [place] [address] [place], [place], [address] Phone: [phone]   Fax:  [phone]  Name: Jody [name] MRN: [id number] Date of Birth: [birth year]

## 2015-12-18 ENCOUNTER — Other Ambulatory Visit: Payer: Self-pay | Admitting: *Deleted

## 2015-12-18 DIAGNOSIS — I2510 Atherosclerotic heart disease of native coronary artery without angina pectoris: Secondary | ICD-10-CM | POA: Diagnosis not present

## 2015-12-18 DIAGNOSIS — L03115 Cellulitis of right lower limb: Secondary | ICD-10-CM

## 2015-12-18 DIAGNOSIS — I83013 Varicose veins of right lower extremity with ulcer of ankle: Secondary | ICD-10-CM

## 2015-12-18 DIAGNOSIS — Z79891 Long term (current) use of opiate analgesic: Secondary | ICD-10-CM | POA: Diagnosis not present

## 2015-12-18 DIAGNOSIS — I5032 Chronic diastolic (congestive) heart failure: Secondary | ICD-10-CM | POA: Diagnosis not present

## 2015-12-18 DIAGNOSIS — L97319 Non-pressure chronic ulcer of right ankle with unspecified severity: Secondary | ICD-10-CM

## 2015-12-18 DIAGNOSIS — I872 Venous insufficiency (chronic) (peripheral): Secondary | ICD-10-CM | POA: Diagnosis not present

## 2015-12-18 DIAGNOSIS — L97219 Non-pressure chronic ulcer of right calf with unspecified severity: Secondary | ICD-10-CM | POA: Diagnosis not present

## 2015-12-18 DIAGNOSIS — Z8679 Personal history of other diseases of the circulatory system: Secondary | ICD-10-CM | POA: Diagnosis not present

## 2015-12-18 DIAGNOSIS — L97419 Non-pressure chronic ulcer of right heel and midfoot with unspecified severity: Secondary | ICD-10-CM | POA: Diagnosis not present

## 2015-12-18 DIAGNOSIS — Z792 Long term (current) use of antibiotics: Secondary | ICD-10-CM | POA: Diagnosis not present

## 2015-12-18 DIAGNOSIS — Z96652 Presence of left artificial knee joint: Secondary | ICD-10-CM | POA: Diagnosis not present

## 2015-12-18 DIAGNOSIS — I110 Hypertensive heart disease with heart failure: Secondary | ICD-10-CM | POA: Diagnosis not present

## 2015-12-18 DIAGNOSIS — Z7902 Long term (current) use of antithrombotics/antiplatelets: Secondary | ICD-10-CM | POA: Diagnosis not present

## 2015-12-18 DIAGNOSIS — I720 Aneurysm of carotid artery: Secondary | ICD-10-CM | POA: Diagnosis not present

## 2015-12-18 DIAGNOSIS — I4891 Unspecified atrial fibrillation: Secondary | ICD-10-CM | POA: Diagnosis not present

## 2015-12-18 MED ORDER — MUPIROCIN 2 % EX OINT
1.0000 | TOPICAL_OINTMENT | Freq: Two times a day (BID) | CUTANEOUS | 1 refills | Status: DC
Start: 2015-12-18 — End: 2016-03-15

## 2015-12-21 ENCOUNTER — Other Ambulatory Visit: Payer: Self-pay | Admitting: Family

## 2015-12-21 ENCOUNTER — Telehealth: Payer: Self-pay | Admitting: Family

## 2015-12-21 ENCOUNTER — Telehealth (HOSPITAL_COMMUNITY): Payer: Self-pay

## 2015-12-21 ENCOUNTER — Ambulatory Visit (HOSPITAL_COMMUNITY): Payer: Medicare Other | Admitting: Physical Therapy

## 2015-12-21 DIAGNOSIS — L97319 Non-pressure chronic ulcer of right ankle with unspecified severity: Secondary | ICD-10-CM

## 2015-12-21 DIAGNOSIS — R262 Difficulty in walking, not elsewhere classified: Secondary | ICD-10-CM

## 2015-12-21 DIAGNOSIS — I83013 Varicose veins of right lower extremity with ulcer of ankle: Secondary | ICD-10-CM

## 2015-12-21 DIAGNOSIS — M25571 Pain in right ankle and joints of right foot: Secondary | ICD-10-CM | POA: Diagnosis not present

## 2015-12-21 MED ORDER — CIPROFLOXACIN HCL 500 MG PO TABS: 500.0000 mg | ORAL_TABLET | Freq: Two times a day (BID) | ORAL | 0 refills | Status: DC

## 2015-12-21 NOTE — Telephone Encounter (Signed)
12/21/15 daughter cancelled the 9/22 appt.  She said that her mom would be going to the dr and they would dress her wound there.

## 2015-12-21 NOTE — Therapy (Signed)
[place], [place], [address] Phone: [phone]   Fax:  [phone]  Wound Care Therapy  Patient Details  Name: Jody [name] MRN: [id number] Date of Birth: 06-Dec-1927 Referring Provider: Sharion Balloon   Encounter Date: 12/21/2015      PT End of Session - 12/21/15 1022    Visit Number 4   Number of Visits 12   Date for PT Re-Evaluation 01/02/16   Authorization Type UHC medicare   Authorization - Visit Number 4   Authorization - Number of Visits 10   PT Start Time 0900   PT Stop Time 0925   PT Time Calculation (min) 25 min   Activity Tolerance Patient limited by pain   Behavior During Therapy [facility] for tasks assessed/performed      Past Medical History:  Diagnosis Date  . Afib ([place])   . Anemia   . CAD (coronary artery disease)   . Chronic diastolic heart failure ([place])   . HTN (hypertension)   . Mitral regurgitation    Moderate, echo, 07/2012  . Pulmonary hypertension ([place])     Past Surgical History:  Procedure Laterality Date  . JOINT REPLACEMENT Right Feb 2015   Dr. Case- Ledell Noss  . TOTAL KNEE ARTHROPLASTY Left     There were no vitals filed for this visit.                  Wound Therapy - 12/21/15 1015    Subjective CG states they have been dressing her wounds daily.  Comes today with santyl and bactoban for wounds.  Pt reports only has pain with debridment   Patient and Family Stated Goals For the wound to heal    Date of Onset 10/22/15   Prior Treatments self care and antibiotics    Pain Assessment 0-10   Pain Score 5    Pain Type Acute pain   Pain Location Ankle   Pain Orientation Right   Pain Descriptors / Indicators Aching;Burning   Patients Stated Pain Goal 0   Pain Intervention(s) Medication (See eMAR)   Wound Properties Date First Assessed: 12/03/15 Time First Assessed: 1030 Wound Type: Other (Comment) Location: Ankle Location Orientation: Right Wound Description  (Comments): medical aspect    Dressing Type Gauze (Comment);Other (Comment)   Dressing Changed Changed   Dressing Status Old drainage   Dressing Change Frequency PRN   Site / Wound Assessment Friable;Yellow   % Wound base Red or Granulating 90%   % Wound base Yellow 10%   Peri-wound Assessment Erythema (blanchable)   Drainage Amount Minimal   Drainage Description No odor;Serosanguineous   Treatment Cleansed;Debridement (Selective)   Wound Properties Date First Assessed: 12/03/15 Time First Assessed: 0951 Wound Type: Other (Comment) Location: Ankle Location Orientation: Right Wound Description (Comments): lateral aspect Present on Admission: Yes   Dressing Type Gauze (Comment);Other (Comment)   Dressing Changed Changed   Dressing Status Old drainage   Dressing Change Frequency PRN   Site / Wound Assessment Friable;Painful;Yellow   % Wound base Red or Granulating 30%   % Wound base Yellow 70%   Peri-wound Assessment Edema   Drainage Amount Minimal   Drainage Description Serosanguineous;No odor   Treatment Cleansed;Debridement (Selective)   Wound Therapy - Clinical Statement Able to remove significant amount of slough from borders of wound.  Less adherent with granulation buds present beneath.  pt continues to be sensitve to debridement.  Removed dry skin from medial aspect with several  places still opened.  Used bactroban to medial ankle and santly to lateral.    Wound Therapy - Functional Problem List pain limiting walking,    Factors Delaying/Impairing Wound Healing Infection - systemic/local;Vascular compromise   Hydrotherapy Plan Debridement;Dressing change;Patient/family education   Wound Therapy - Frequency --  2x week x 6 weeks.    Wound Therapy - Current Recommendations PT   Wound Plan Pt education, wound care inclucding cleansing, debridement and multilayer dressing changes.  discontinue santyl when able to remove remainder of slough.    Dressing  Vaseline to perimeter, santyl  and gauze to lateral ankle bactroban to medial ankle wounds, kling and #3 netting                   PT Short Term Goals - 12/17/15 1135      PT SHORT TERM GOAL #1   Title Pt pain in her right ankle to be no greater than a 5/10 to allow pt to be on her feet for more than 30 minutes.   Time 3   Period Weeks   Status Achieved     PT SHORT TERM GOAL #2   Title Pt drainage from wounds on right ankle to be scant to decrease risk of infection    Time 3   Period Weeks   Status On-going     PT SHORT TERM GOAL #3   Title Pt wound size to have decreased by 3 cm x 3 cm to decrease risk of infection    Time 3   Period Weeks   Status On-going           PT Long Term Goals - 12/17/15 1136      PT LONG TERM GOAL #1   Title Pt pain level to be no greater than a 2/10 to allow pt to be able to be on her feet for up to 40 minutes for work tasks.    Time 6   Period Weeks   Status On-going     PT LONG TERM GOAL #2   Title Pt wounds to be healed    Time 6   Period Weeks   Status On-going     PT LONG TERM GOAL #3   Title Pt to be wearing compression garments everyday and to verbalize the importance of why you need to wear compression when you have varicosities.     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 Cellulitis 12/08/2015  . (HFpEF) heart failure with preserved ejection fraction ([place]) 04/16/2013  . Mitral regurgitation   . Anemia   . HTN (hypertension)   . Atrial fibrillation ([place]) 04/02/2009  . DIASTOLIC HEART FAILURE, CHRONIC 04/02/2009    Teena Irani,  PTA/CLT [phone]  12/21/2015, 10:23 AM  [facility] [address] [place], [place], [address] Phone: [phone]   Fax:  [phone]  Name: Jody [name] MRN: [id number] Date of Birth: September 05, 1927

## 2015-12-21 NOTE — Telephone Encounter (Signed)
Has pt been seen at wound care?

## 2015-12-21 NOTE — Telephone Encounter (Signed)
Forwarded to you as PCP.  Thanks, WS

## 2015-12-24 ENCOUNTER — Telehealth: Payer: Self-pay | Admitting: Family

## 2015-12-24 ENCOUNTER — Telehealth (HOSPITAL_COMMUNITY): Payer: Self-pay | Admitting: Physical Therapy

## 2015-12-24 DIAGNOSIS — Z96652 Presence of left artificial knee joint: Secondary | ICD-10-CM | POA: Diagnosis not present

## 2015-12-24 DIAGNOSIS — I110 Hypertensive heart disease with heart failure: Secondary | ICD-10-CM | POA: Diagnosis not present

## 2015-12-24 DIAGNOSIS — I720 Aneurysm of carotid artery: Secondary | ICD-10-CM | POA: Diagnosis not present

## 2015-12-24 DIAGNOSIS — L97219 Non-pressure chronic ulcer of right calf with unspecified severity: Secondary | ICD-10-CM | POA: Diagnosis not present

## 2015-12-24 DIAGNOSIS — I872 Venous insufficiency (chronic) (peripheral): Secondary | ICD-10-CM | POA: Diagnosis not present

## 2015-12-24 DIAGNOSIS — I4891 Unspecified atrial fibrillation: Secondary | ICD-10-CM | POA: Diagnosis not present

## 2015-12-24 DIAGNOSIS — Z8679 Personal history of other diseases of the circulatory system: Secondary | ICD-10-CM | POA: Diagnosis not present

## 2015-12-24 DIAGNOSIS — L97419 Non-pressure chronic ulcer of right heel and midfoot with unspecified severity: Secondary | ICD-10-CM | POA: Diagnosis not present

## 2015-12-24 DIAGNOSIS — L03115 Cellulitis of right lower limb: Secondary | ICD-10-CM | POA: Diagnosis not present

## 2015-12-24 DIAGNOSIS — I5032 Chronic diastolic (congestive) heart failure: Secondary | ICD-10-CM | POA: Diagnosis not present

## 2015-12-24 DIAGNOSIS — Z79891 Long term (current) use of opiate analgesic: Secondary | ICD-10-CM | POA: Diagnosis not present

## 2015-12-24 DIAGNOSIS — Z792 Long term (current) use of antibiotics: Secondary | ICD-10-CM | POA: Diagnosis not present

## 2015-12-24 DIAGNOSIS — I2510 Atherosclerotic heart disease of native coronary artery without angina pectoris: Secondary | ICD-10-CM | POA: Diagnosis not present

## 2015-12-24 DIAGNOSIS — Z7902 Long term (current) use of antithrombotics/antiplatelets: Secondary | ICD-10-CM | POA: Diagnosis not present

## 2015-12-24 NOTE — Telephone Encounter (Signed)
Called wanted an earlier apptment we did not have one and they said it was ok, they were  just checking offered to put on waiting list and they declined today. NF 12/24/15

## 2015-12-25 ENCOUNTER — Ambulatory Visit (INDEPENDENT_AMBULATORY_CARE_PROVIDER_SITE_OTHER): Payer: Medicare Other | Admitting: Family

## 2015-12-25 ENCOUNTER — Encounter: Payer: Self-pay | Admitting: Family

## 2015-12-25 ENCOUNTER — Ambulatory Visit (HOSPITAL_COMMUNITY): Payer: Medicare Other

## 2015-12-25 VITALS — BP 128/65 | HR 77 | Temp 97.0°F | Ht 62.0 in | Wt 110.2 lb

## 2015-12-25 DIAGNOSIS — Z09 Encounter for follow-up examination after completed treatment for conditions other than malignant neoplasm: Secondary | ICD-10-CM | POA: Diagnosis not present

## 2015-12-25 DIAGNOSIS — L03115 Cellulitis of right lower limb: Secondary | ICD-10-CM | POA: Diagnosis not present

## 2015-12-25 MED ORDER — CIPROFLOXACIN HCL 500 MG PO TABS: 500.0000 mg | ORAL_TABLET | Freq: Two times a day (BID) | ORAL | 0 refills | Status: DC

## 2015-12-25 NOTE — Progress Notes (Signed)
   Subjective:    Patient ID: Jody [name], female    DOB: Nov 07, 1927, 82 y.o.   MRN: [id number]  HPI PT presents to the office today for hospital follow up. Pt was admitted to the hospital on  12/08/15 and was discharged on 12/14/15 for cellulitis of right lower leg. PT was given IV antibiotics and discharged home on Cipro. PT was contacted by nurse on 12/16/15 for transitional care appointment. Pt continues to go to Wound Care  Twice a week. Pt continues to have aching pain of 10 out 10 that is worse at night. PT reports a yellow discharge. Pt had a positive culture of pseudomonas that was sensitive to cipro.   [facility] notes were reviewed   Review of Systems  Musculoskeletal: Positive for gait problem.  Skin: Positive for rash.  All other systems reviewed and are negative.      Objective:   Physical Exam  Constitutional: She is oriented to person, place, and time. She appears well-developed and well-nourished.  Cardiovascular: Normal rate, regular rhythm and intact distal pulses.   Murmur heard. Pulmonary/Chest: Effort normal and breath sounds normal.  Abdominal: Soft. Bowel sounds are normal.  Musculoskeletal: Normal range of motion.  Neurological: She is alert and oriented to person, place, and time.  Skin: Skin is warm and dry. There is erythema.  Right lateral ankle with ulcer, slough present with mildly erythemas border.   Psychiatric: She has a normal mood and affect. Her behavior is normal. Judgment and thought content normal.    BP 128/65   Pulse 77   Temp 97 F (36.1 C) (Oral)   Ht 5\' 2"  (1.575 m)   Wt 110 lb 3.2 oz (50 kg)   BMI 20.16 kg/m    Area cleaned, santyl and silver gauze to lateral ankle and  bactroban to medial ankle wound. Kling wrapped.     Assessment & Plan:  1. Cellulitis of right lower extremity -Keep foot elevated - Stay off of foot as much as possible -Cipro reordered -Continue wound care and dressing changes daily - ciprofloxacin  (CIPRO) 500 MG tablet; Take 1 tablet (500 mg total) by mouth 2 (two) times daily.  Dispense: 20 tablet; Refill: 0  2. Hospital discharge follow-up  Evelina Dun, FNP

## 2015-12-25 NOTE — Patient Instructions (Signed)

## 2015-12-28 ENCOUNTER — Telehealth (HOSPITAL_COMMUNITY): Payer: Self-pay | Admitting: Physical Therapy

## 2015-12-28 ENCOUNTER — Ambulatory Visit (HOSPITAL_COMMUNITY): Payer: Medicare Other | Admitting: Physical Therapy

## 2015-12-28 DIAGNOSIS — R262 Difficulty in walking, not elsewhere classified: Secondary | ICD-10-CM | POA: Diagnosis not present

## 2015-12-28 DIAGNOSIS — M25571 Pain in right ankle and joints of right foot: Secondary | ICD-10-CM

## 2015-12-28 DIAGNOSIS — I83013 Varicose veins of right lower extremity with ulcer of ankle: Secondary | ICD-10-CM | POA: Diagnosis not present

## 2015-12-28 DIAGNOSIS — L97319 Non-pressure chronic ulcer of right ankle with unspecified severity: Secondary | ICD-10-CM

## 2015-12-28 NOTE — Telephone Encounter (Signed)
Malachy Mood daughter of patient, Martin Majestic over the insurance benefitswith with patient today. NF 12/28/15

## 2015-12-28 NOTE — Therapy (Signed)
[place], [place], [address] Phone: [phone]   Fax:  [phone]  Wound Care Therapy(Re-Assessment)  Patient Details  Name: RAKIRA BENTHAM MRN: [id number] Date of Birth: 15-Jun-1927 Referring Provider: Sharion Balloon   Encounter Date: 12/28/2015      PT End of Session - 12/28/15 1749    Visit Number 5   Number of Visits 13   Date for PT Re-Evaluation 01/25/16   Authorization Type UHC medicare (G-codes done 5th session)   Authorization - Visit Number 5   Authorization - Number of Visits 15   PT Start Time [id number]   PT Stop Time 1727   PT Time Calculation (min) 39 min   Activity Tolerance Patient tolerated treatment well   Behavior During Therapy [facility] for tasks assessed/performed      Past Medical History:  Diagnosis Date  . Afib ([place])   . Anemia   . CAD (coronary artery disease)   . Chronic diastolic heart failure ([place])   . HTN (hypertension)   . Mitral regurgitation    Moderate, echo, 07/2012  . Pulmonary hypertension ([place])     Past Surgical History:  Procedure Laterality Date  . JOINT REPLACEMENT Right Feb 2015   Dr. Case- Ledell Noss  . TOTAL KNEE ARTHROPLASTY Left     There were no vitals filed for this visit.                  Wound Therapy - 12/28/15 1738    Subjective Patient arrives with her caregiver, who states that things are going well and wounds do seem to be getting better. No major changes since last time other than MD is having htem put silver on her wounds at home.    Patient and Family Stated Goals For the wound to heal    Date of Onset 10/22/15   Prior Treatments self care and antibiotics    Pain Assessment No/denies pain   Evaluation and Treatment Procedures Explained to Patient/Family Yes   Evaluation and Treatment Procedures agreed to   Wound Properties Date First Assessed: 12/03/15 Time First Assessed: 1030 Wound Type: Other (Comment) Location: Ankle Location  Orientation: Right Wound Description (Comments): medical aspect    Dressing Type Gauze (Comment);Other (Comment)   Dressing Changed Changed   Dressing Status Old drainage   Dressing Change Frequency PRN   % Wound base Red or Granulating 100%   % Wound base Yellow 0%   Peri-wound Assessment Erythema (blanchable);Other (Comment)  severe dryness of skin noted    Drainage Amount None   Drainage Description No odor   Treatment Cleansed;Packing (Impregnated strip);Other (Comment)  xeroform, vasoline, gauze    Wound Properties Date First Assessed: 12/03/15 Time First Assessed: 0951 Wound Type: Other (Comment) Location: Ankle Location Orientation: Right Wound Description (Comments): lateral aspect Present on Admission: Yes   Dressing Type Gauze (Comment);Other (Comment)  silver    Dressing Changed Changed   Dressing Status Old drainage   Dressing Change Frequency PRN   Site / Wound Assessment Friable;Painful;Yellow   % Wound base Red or Granulating 50%   % Wound base Yellow 50%   Peri-wound Assessment Edema   Wound Length (cm) 4.3 cm  larger area; smaller 1.0cm   Wound Width (cm) 2 cm  larger spot, smaller spot 1.0cm   Wound Depth (cm) 0.1 cm  approx 0.1cm deep    Drainage Amount Minimal   Drainage Description Serosanguineous;No odor   Treatment Cleansed;Debridement (Selective);Other (Comment)  debridement, santyl, gauze   Selective Debridement - Location lateral wound    Selective Debridement - Tools Used Forceps;Scalpel   Selective Debridement - Tissue Removed slough    Wound Therapy - Clinical Statement Re-assessment performed today. Patient's wounds appear to be improving, with the medial wound reduced to a very small scabbed area and very dry skin noted around this area; lateral wound appears relatively reduced in size with approximately 50-50 slough and granulation tissue. Cleansed wounds and debrided non-adherent slough today, applied santyl to adherent slough as well, covered by  protective 4x4 on lateral side. Appiled xeroform and vasoline to mediali area today. Dressed foot/ankle with gauze wrap and netting. Educated caregiver to please schedule more appointments for furhter wound care; recommend ongoing wound care services to facilitate wound healing and educate patient/caregiver on appropriate skin care strategies.    Wound Therapy - Functional Problem List pain limiting walking,    Factors Delaying/Impairing Wound Healing Infection - systemic/local;Vascular compromise   Hydrotherapy Plan Debridement;Dressing change;Patient/family education   Wound Therapy - Frequency Other (comment)  2x/week for 4 more weeks    Wound Therapy - Current Recommendations PT   Wound Plan Pt education, wound care inclucding cleansing, debridement and multilayer dressing changes.  discontinue santyl when able to remove remainder of slough.                  PT Education - 12/28/15 1749    Education provided Yes   Education Details progress with skilled PT services; xeroform and vasoline to medial area; may put silver on lateral wound with daily dressing changes but PT to continue with santyl    Person(s) Educated Patient;Child(ren)   Methods Explanation;Demonstration   Comprehension Verbalized understanding          PT Short Term Goals - 12/28/15 1750      PT SHORT TERM GOAL #1   Title Pt pain in her right ankle to be no greater than a 5/10 to allow pt to be on her feet for more than 30 minutes.   Time 3   Period Weeks   Status Achieved     PT SHORT TERM GOAL #2   Title Pt drainage from wounds on right ankle to be scant to decrease risk of infection    Time 3   Period Weeks   Status On-going     PT SHORT TERM GOAL #3   Title Pt wound size to have decreased by 3 cm x 3 cm to decrease risk of infection    Time 3   Period Weeks   Status On-going           PT Long Term Goals - 12/28/15 1751      PT LONG TERM GOAL #1   Title Pt pain level to be no greater  than a 2/10 to allow pt to be able to be on her feet for up to 40 minutes for work tasks.    Time 6   Period Weeks   Status On-going     PT LONG TERM GOAL #2   Title Pt wounds to be healed    Time 6   Period Weeks   Status On-going     PT LONG TERM GOAL #3   Title Pt to be wearing compression garments everyday and to verbalize the importance of why you need to wear compression when you have varicosities.     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 Cellulitis 12/08/2015  . (HFpEF) heart failure with preserved ejection fraction ([place]) 04/16/2013  . Mitral regurgitation   . Anemia   . HTN (hypertension)   . Atrial fibrillation ([place]) 04/02/2009  . DIASTOLIC HEART FAILURE, CHRONIC 04/02/2009    Deniece Ree PT, DPT [facility] [address] [place], [place], [address] Phone: [phone]   Fax:  [phone]  Name: ASSATA SHOAFF MRN: [id number] Date of Birth: 02-19-1928

## 2015-12-30 ENCOUNTER — Ambulatory Visit (HOSPITAL_COMMUNITY): Payer: Medicare Other | Admitting: Physical Therapy

## 2015-12-30 DIAGNOSIS — L97319 Non-pressure chronic ulcer of right ankle with unspecified severity: Secondary | ICD-10-CM

## 2015-12-30 DIAGNOSIS — M25571 Pain in right ankle and joints of right foot: Secondary | ICD-10-CM | POA: Diagnosis not present

## 2015-12-30 DIAGNOSIS — I83013 Varicose veins of right lower extremity with ulcer of ankle: Secondary | ICD-10-CM | POA: Diagnosis not present

## 2015-12-30 DIAGNOSIS — R262 Difficulty in walking, not elsewhere classified: Secondary | ICD-10-CM

## 2015-12-30 NOTE — Therapy (Signed)
[place], [place], [address] Phone: [phone]   Fax:  [phone]  Wound Care Therapy  Patient Details  Name: Jody [name] MRN: [id number] Date of Birth: 1927/07/02 Referring Provider: Sharion Balloon   Encounter Date: 12/30/2015    Past Medical History:  Diagnosis Date  . Afib ([place])   . Anemia   . CAD (coronary artery disease)   . Chronic diastolic heart failure ([place])   . HTN (hypertension)   . Mitral regurgitation    Moderate, echo, 07/2012  . Pulmonary hypertension ([place])     Past Surgical History:  Procedure Laterality Date  . JOINT REPLACEMENT Right Feb 2015   Dr. Case- Ledell Noss  . TOTAL KNEE ARTHROPLASTY Left     There were no vitals filed for this visit.                  Wound Therapy - 12/30/15 1704    Subjective Arrives today with her caregiver.  No pain or complications reported.   Patient and Family Stated Goals For the wound to heal    Date of Onset 10/22/15   Prior Treatments self care and antibiotics    Pain Assessment No/denies pain   Wound Properties Date First Assessed: 12/03/15 Time First Assessed: 1030 Wound Type: Other (Comment) Location: Ankle Location Orientation: Right Wound Description (Comments): medical aspect    Dressing Type Gauze (Comment);Other (Comment)   Dressing Changed Changed   Dressing Status Old drainage   Dressing Change Frequency PRN   % Wound base Red or Granulating 85%   % Wound base Yellow 15%   Peri-wound Assessment Erythema (blanchable);Other (Comment)  severe dryness of skin noted    Drainage Amount None   Drainage Description No odor   Treatment Cleansed;Debridement (Selective)   Wound Properties Date First Assessed: 12/03/15 Time First Assessed: 0951 Wound Type: Other (Comment) Location: Ankle Location Orientation: Right Wound Description (Comments): lateral aspect Present on Admission: Yes   Dressing Type Gauze (Comment);Other (Comment)   silver    Dressing Changed Changed   Dressing Status Old drainage   Dressing Change Frequency PRN   Site / Wound Assessment Friable;Painful;Yellow   % Wound base Red or Granulating 85%   % Wound base Yellow 15%   Peri-wound Assessment Edema   Drainage Amount Minimal   Drainage Description Serosanguineous;No odor   Treatment Cleansed;Debridement (Selective)   Selective Debridement - Location lateral wound    Selective Debridement - Tools Used Forceps;Scalpel   Selective Debridement - Tissue Removed slough    Wound Therapy - Clinical Statement Able to remove most of remaining slough this session.  wounds no longer needing bactroban or santyl at this point due to increased granulation.  Changed to xeroform this session. Secured with kling and #5 netting.   Wound Therapy - Functional Problem List pain limiting walking,    Factors Delaying/Impairing Wound Healing Infection - systemic/local;Vascular compromise   Hydrotherapy Plan Debridement;Dressing change;Patient/family education   Wound Therapy - Frequency Other (comment)  2x/week for 4 more weeks    Wound Therapy - Current Recommendations PT   Wound Plan Pt education, wound care inclucding cleansing, debridement and appropriate dressing changes.   Dressing  xeroform, kling, netting                 PT Education - 12/30/15 1711    Education provided Yes   Education Details Instructed CG to disontinue daily dressings and use of topical ointments (santyl or  bactroban).  Pt to only change PRN and only at wound clinic at this point due to increased granulation.   Person(s) Educated Patient;Child(ren)   Methods Explanation   Comprehension Verbalized understanding          PT Short Term Goals - 12/28/15 1750      PT SHORT TERM GOAL #1   Title Pt pain in her right ankle to be no greater than a 5/10 to allow pt to be on her feet for more than 30 minutes.   Time 3   Period Weeks   Status Achieved     PT SHORT TERM GOAL #2    Title Pt drainage from wounds on right ankle to be scant to decrease risk of infection    Time 3   Period Weeks   Status On-going     PT SHORT TERM GOAL #3   Title Pt wound size to have decreased by 3 cm x 3 cm to decrease risk of infection    Time 3   Period Weeks   Status On-going           PT Long Term Goals - 12/28/15 1751      PT LONG TERM GOAL #1   Title Pt pain level to be no greater than a 2/10 to allow pt to be able to be on her feet for up to 40 minutes for work tasks.    Time 6   Period Weeks   Status On-going     PT LONG TERM GOAL #2   Title Pt wounds to be healed    Time 6   Period Weeks   Status On-going     PT LONG TERM GOAL #3   Title Pt to be wearing compression garments everyday and to verbalize the importance of why you need to wear compression when you have varicosities.     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 Cellulitis 12/08/2015  . (HFpEF) heart failure with preserved ejection fraction ([place]) 04/16/2013  . Mitral regurgitation   . Anemia   . HTN (hypertension)   . Atrial fibrillation ([place]) 04/02/2009  . DIASTOLIC HEART FAILURE, CHRONIC 04/02/2009    Teena Irani, PTA/CLT [phone]  12/30/2015, 5:13 PM  [facility] [address] [place], [place], [address] Phone: [phone]   Fax:  [phone]  Name: Jody [name] MRN: [id number] Date of Birth: 1927-08-16

## 2015-12-31 ENCOUNTER — Other Ambulatory Visit: Payer: Self-pay | Admitting: Family

## 2015-12-31 ENCOUNTER — Telehealth: Payer: Self-pay | Admitting: Family

## 2015-12-31 NOTE — Telephone Encounter (Signed)
Patient seen since phone [place]

## 2016-01-04 ENCOUNTER — Telehealth (HOSPITAL_COMMUNITY): Payer: Self-pay | Admitting: Physical Therapy

## 2016-01-04 ENCOUNTER — Telehealth (HOSPITAL_COMMUNITY): Payer: Self-pay

## 2016-01-04 ENCOUNTER — Ambulatory Visit (HOSPITAL_COMMUNITY): Payer: Medicare Other | Attending: Family | Admitting: Physical Therapy

## 2016-01-04 DIAGNOSIS — M25571 Pain in right ankle and joints of right foot: Secondary | ICD-10-CM | POA: Diagnosis not present

## 2016-01-04 DIAGNOSIS — R262 Difficulty in walking, not elsewhere classified: Secondary | ICD-10-CM | POA: Insufficient documentation

## 2016-01-04 DIAGNOSIS — I83013 Varicose veins of right lower extremity with ulcer of ankle: Secondary | ICD-10-CM

## 2016-01-04 DIAGNOSIS — L97319 Non-pressure chronic ulcer of right ankle with unspecified severity: Secondary | ICD-10-CM

## 2016-01-04 NOTE — Telephone Encounter (Signed)
Called Home health care last week expressing concern over daughter voicing that her wound was being cared for by home health.  Anderson Malta from [facility] returned call today and was unaware that we were seeing patient as well.  [facility] will discharge patient at this time. Teena Irani, PTA/CLT [phone]

## 2016-01-04 NOTE — Telephone Encounter (Signed)
01/04/16 daughter cx Thursday appt. She has an appt at another drs on this date

## 2016-01-04 NOTE — Therapy (Signed)
[place], [place], [address] Phone: [phone]   Fax:  [phone]  Wound Care Therapy  Patient Details  Name: Jody [name] MRN: [id number] Date of Birth: 09-29-1927 Referring Provider: Sharion Balloon   Encounter Date: 01/04/2016      PT End of Session - 01/04/16 0910    Visit Number 6   Number of Visits 13   Date for PT Re-Evaluation 01/25/16   Authorization Type UHC medicare (G-codes done 5th session)  cert good 0000000   Authorization - Visit Number 6   Authorization - Number of Visits 15   PT Start Time 0815   PT Stop Time 0845   PT Time Calculation (min) 30 min   Activity Tolerance Patient tolerated treatment well   Behavior During Therapy [facility] for tasks assessed/performed      Past Medical History:  Diagnosis Date  . Afib ([place])   . Anemia   . CAD (coronary artery disease)   . Chronic diastolic heart failure ([place])   . HTN (hypertension)   . Mitral regurgitation    Moderate, echo, 07/2012  . Pulmonary hypertension     Past Surgical History:  Procedure Laterality Date  . JOINT REPLACEMENT Right Feb 2015   Dr. Case- Ledell Noss  . TOTAL KNEE ARTHROPLASTY Left     There were no vitals filed for this visit.                  Wound Therapy - 01/04/16 0902    Subjective Pt states they changed it once since last visit.  States it has been hurting some, more than usual.   Patient and Family Stated Goals For the wound to heal    Date of Onset 10/22/15   Prior Treatments self care and antibiotics    Pain Assessment 0-10   Pain Score 3    Pain Type Acute pain   Pain Location Ankle   Pain Orientation Right   Pain Descriptors / Indicators Burning   Wound Properties Date First Assessed: 12/03/15 Time First Assessed: 1030 Wound Type: Other (Comment) Location: Ankle Location Orientation: Right Wound Description (Comments): medical aspect    Dressing Type Gauze (Comment);Other (Comment)   Dressing Changed Changed   Dressing Status Old drainage   Dressing Change Frequency PRN   % Wound base Red or Granulating 90%   % Wound base Yellow 10%   Peri-wound Assessment Erythema (blanchable);Other (Comment)  severe dryness of skin noted    Wound Length (cm) 0.5 cm  was 5 cm   Wound Width (cm) 0.5 cm  was 4 cm   Wound Depth (cm) 0.2 cm  was not measured previously   Drainage Amount Scant   Drainage Description No odor   Treatment Cleansed;Debridement (Selective)   Wound Properties Date First Assessed: 12/03/15 Time First Assessed: 0951 Wound Type: Other (Comment) Location: Ankle Location Orientation: Right Wound Description (Comments): lateral aspect Present on Admission: Yes   Dressing Type Gauze (Comment);Other (Comment)  silver    Dressing Changed Changed   Dressing Status Old drainage   Dressing Change Frequency PRN   Site / Wound Assessment Friable;Painful;Yellow   % Wound base Red or Granulating 85%   % Wound base Yellow 15%   Peri-wound Assessment Edema   Wound Length (cm) 6 cm  was 8cm   Wound Width (cm) 2.5 cm  was 7 cm   Wound Depth (cm) 0 cm  was 0.2 cm   Drainage Amount  Minimal   Drainage Description Serosanguineous;No odor   Treatment Cleansed;Debridement (Selective)   Selective Debridement - Location lateral wound    Selective Debridement - Tools Used Forceps;Scalpel   Selective Debridement - Tissue Removed slough    Wound Therapy - Clinical Statement Dressing changed once since last week.  Home health nurse returned call (jennifer from [facility]) and was unaware of woundcare receiving here.  She will discharge pateint from home health.  Wounds remeasured and have decreased significantly.  Doing well with xeroform dressing and approximating well.  Some pain voiced with debridement today.     Wound Therapy - Functional Problem List pain limiting walking,    Factors Delaying/Impairing Wound Healing Infection - systemic/local;Vascular compromise   Hydrotherapy  Plan Debridement;Dressing change;Patient/family education   Wound Therapy - Frequency Other (comment)  2x/week for 4 more weeks    Wound Therapy - Current Recommendations PT   Wound Plan Pt education, wound care inclucding cleansing, debridement and appropriate dressing changes.   Dressing  xeroform, kling, netting                   PT Short Term Goals - 12/28/15 1750      PT SHORT TERM GOAL #1   Title Pt pain in her right ankle to be no greater than a 5/10 to allow pt to be on her feet for more than 30 minutes.   Time 3   Period Weeks   Status Achieved     PT SHORT TERM GOAL #2   Title Pt drainage from wounds on right ankle to be scant to decrease risk of infection    Time 3   Period Weeks   Status On-going     PT SHORT TERM GOAL #3   Title Pt wound size to have decreased by 3 cm x 3 cm to decrease risk of infection    Time 3   Period Weeks   Status On-going           PT Long Term Goals - 12/28/15 1751      PT LONG TERM GOAL #1   Title Pt pain level to be no greater than a 2/10 to allow pt to be able to be on her feet for up to 40 minutes for work tasks.    Time 6   Period Weeks   Status On-going     PT LONG TERM GOAL #2   Title Pt wounds to be healed    Time 6   Period Weeks   Status On-going     PT LONG TERM GOAL #3   Title Pt to be wearing compression garments everyday and to verbalize the importance of why you need to wear compression when you have varicosities.     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 Cellulitis 12/08/2015  . (HFpEF) heart  failure with preserved ejection fraction ([place]) 04/16/2013  . Mitral regurgitation   . Anemia   . HTN (hypertension)   . Atrial fibrillation ([place]) 04/02/2009  . DIASTOLIC  HEART FAILURE, CHRONIC 04/02/2009    Teena Irani, PTA/CLT [phone]  01/04/2016, 9:15 AM  [place], [place], [address] Phone: [phone]   Fax:  [phone]  Name: KRISTEE HOLNESS MRN: [id number] Date of Birth: Jul 04, 1927

## 2016-01-07 ENCOUNTER — Ambulatory Visit (HOSPITAL_COMMUNITY): Payer: Medicare Other

## 2016-01-08 ENCOUNTER — Ambulatory Visit (INDEPENDENT_AMBULATORY_CARE_PROVIDER_SITE_OTHER): Payer: Medicare Other | Admitting: Family

## 2016-01-08 ENCOUNTER — Encounter: Payer: Self-pay | Admitting: Family

## 2016-01-08 VITALS — BP 125/72 | HR 55 | Temp 97.6°F | Ht 62.0 in | Wt 113.4 lb

## 2016-01-08 DIAGNOSIS — L98499 Non-pressure chronic ulcer of skin of other sites with unspecified severity: Secondary | ICD-10-CM | POA: Diagnosis not present

## 2016-01-08 DIAGNOSIS — Z5189 Encounter for other specified aftercare: Secondary | ICD-10-CM | POA: Diagnosis not present

## 2016-01-08 DIAGNOSIS — L03115 Cellulitis of right lower limb: Secondary | ICD-10-CM

## 2016-01-08 DIAGNOSIS — IMO0002 Reserved for concepts with insufficient information to code with codable children: Secondary | ICD-10-CM

## 2016-01-08 MED ORDER — CIPROFLOXACIN HCL 500 MG PO TABS: 500.0000 mg | ORAL_TABLET | Freq: Two times a day (BID) | ORAL | 0 refills | Status: DC

## 2016-01-08 NOTE — Progress Notes (Signed)
   Subjective:    Patient ID: Vicki Mallet, female    DOB: 10-25-1927, 82 y.o.   MRN: [id number]  PT presents to the office today to recheck cellulitis and right lower leg . Pt was admitted to the hospital on  12/08/15 and was discharged on 12/14/15 for cellulitis of right lower leg. PT was given IV antibiotics and discharged home on Cipro. Pt had a positive culture of pseudomonas that was sensitive to cipro. Pt goes to wound care twice a week for dressing changes. PT states her wound continues to improve.  Wound Check  She was originally treated more than 14 days ago. Previous treatment included oral antibiotics and IV/IM antibiotics. Her temperature was unmeasured prior to arrival. There has been clear discharge from the wound. There is no redness present. There is no swelling present. The pain has improved. She has no difficulty moving the affected extremity or digit.      Review of Systems  Skin: Positive for wound.       Objective:   Physical Exam  Cardiovascular: Normal rate and regular rhythm.   Murmur heard. Pulmonary/Chest: Effort normal and breath sounds normal.  Skin:  Ulcer on right lateral ankle, 2.5 cmX1CM, 1X1.5 cm, and 0.5X 0.5 cm. Erythemas bases, not discharge present. Greatly improved since last visit.      Area cleaned and Vaseline gauze applied and gauze kling wrap applied  BP 125/72   Pulse (!) 55   Temp 97.6 F (36.4 C) (Oral)   Ht 5\' 2"  (1.575 m)   Wt 113 lb 6.4 oz (51.4 kg)   BMI 20.74 kg/m      Assessment & Plan:  1. Cellulitis of right lower extremity - ciprofloxacin (CIPRO) 500 MG tablet; Take 1 tablet (500 mg total) by mouth 2 (two) times daily.  Dispense: 20 tablet; Refill: 0  2. Ulcer ([place])  3. Visit for wound check  Continue with wound care Keep clean and dry Will renew cipro rx RTO prn or if worsens Patient gave disability paperwork to be filled out  Evelina Dun, FNP

## 2016-01-08 NOTE — Patient Instructions (Signed)

## 2016-01-12 ENCOUNTER — Ambulatory Visit (HOSPITAL_COMMUNITY): Payer: Medicare Other

## 2016-01-12 DIAGNOSIS — I83013 Varicose veins of right lower extremity with ulcer of ankle: Secondary | ICD-10-CM | POA: Diagnosis not present

## 2016-01-12 DIAGNOSIS — M25571 Pain in right ankle and joints of right foot: Secondary | ICD-10-CM | POA: Diagnosis not present

## 2016-01-12 DIAGNOSIS — R262 Difficulty in walking, not elsewhere classified: Secondary | ICD-10-CM | POA: Diagnosis not present

## 2016-01-12 DIAGNOSIS — L97319 Non-pressure chronic ulcer of right ankle with unspecified severity: Secondary | ICD-10-CM

## 2016-01-12 NOTE — Therapy (Signed)
[place], [place], [address] Phone: [phone]   Fax:  [phone]  Wound Care Therapy  Patient Details  Name: Jody [name] MRN: [id number] Date of Birth: [birth year] Referring Provider: Sharion Balloon   Encounter Date: 01/12/2016      PT End of Session - 01/12/16 1004    Visit Number 7   Number of Visits 13   Date for PT Re-Evaluation 01/25/16   Authorization Type UHC medicare (G-codes done 5th session)  cert good 0000000   Authorization - Visit Number 7   Authorization - Number of Visits 15   PT Start Time [phone]   PT Stop Time 1000   PT Time Calculation (min) 26 min   Activity Tolerance Patient tolerated treatment well   Behavior During Therapy [facility] for tasks assessed/performed      Past Medical History:  Diagnosis Date  . Afib ([place])   . Anemia   . CAD (coronary artery disease)   . Chronic diastolic heart failure ([place])   . HTN (hypertension)   . Mitral regurgitation    Moderate, echo, 07/2012  . Pulmonary hypertension     Past Surgical History:  Procedure Laterality Date  . JOINT REPLACEMENT Right Feb 2015   Dr. Case- Ledell Noss  . TOTAL KNEE ARTHROPLASTY Left     There were no vitals filed for this visit.       Subjective Assessment - 01/12/16 1003    Subjective Pt stated she was feeling good.  No reports of current pain today.  Pt entered wearing water shoes on Rt foot due to discomfort with tennis shoes   Currently in Pain? No/denies                   Wound Therapy - 01/12/16 1005    Subjective Pt stated she was feeling good.  No reports of current pain today.  Pt entered wearing water shoes on Rt foot due to discomfort with tennis shoes   Patient and Family Stated Goals For the wound to heal    Date of Onset 10/22/15   Prior Treatments self care and antibiotics    Pain Assessment No/denies pain   Pain Score 0-No pain   Evaluation and Treatment Procedures Explained to  Patient/Family Yes   Evaluation and Treatment Procedures agreed to   Wound Properties Date First Assessed: 12/03/15 Time First Assessed: 1030 Wound Type: Other (Comment) Location: Ankle Location Orientation: Right Wound Description (Comments): medical aspect    Dressing Type Gauze (Comment)  xeroform, gauze and netting   Dressing Changed Changed   Dressing Status Old drainage   Dressing Change Frequency PRN   % Wound base Red or Granulating 95%   % Wound base Yellow 5%   Peri-wound Assessment Erythema (blanchable);Other (Comment)  dry skin   Drainage Amount Scant   Drainage Description No odor   Treatment Cleansed;Debridement (Selective)   Wound Properties Date First Assessed: 12/03/15 Time First Assessed: 0951 Wound Type: Other (Comment) Location: Ankle Location Orientation: Right Wound Description (Comments): lateral aspect Present on Admission: Yes   Dressing Type Gauze (Comment);Other (Comment)  xeroform, gauze and netting   Dressing Changed Changed   Dressing Status Old drainage   Dressing Change Frequency PRN   Site / Wound Assessment Friable;Painful;Yellow   % Wound base Red or Granulating 90%   % Wound base Yellow 10%   Peri-wound Assessment Edema   Drainage Amount Scant   Drainage Description Serosanguineous;No odor  Treatment Cleansed;Debridement (Selective)   Selective Debridement - Location lateral wound    Selective Debridement - Tools Used Forceps;Scalpel   Selective Debridement - Tissue Removed slough    Wound Therapy - Clinical Statement Wound is progressing well with reports of daughter changing dressings once this week.  Pt entered with dressing intact and no reports of pain at entrance.  Selective debridement for removal of slough and significant amount of dry skin.  Continued with xeroform and gauze with netting.  Due to minimal selective debridement necessary this session reduce frequency to 1x per week.     Wound Therapy - Functional Problem List pain limiting  walking,    Factors Delaying/Impairing Wound Healing Infection - systemic/local;Vascular compromise   Hydrotherapy Plan Debridement;Dressing change;Patient/family education   Wound Therapy - Frequency Other (comment)  Reduce to 1x/week    Wound Therapy - Current Recommendations PT   Wound Plan Pt education, wound care inclucding cleansing, debridement and appropriate dressing changes.   Dressing  xeroform, kling, netting                   PT Short Term Goals - 12/28/15 1750      PT SHORT TERM GOAL #1   Title Pt pain in her right ankle to be no greater than a 5/10 to allow pt to be on her feet for more than 30 minutes.   Time 3   Period Weeks   Status Achieved     PT SHORT TERM GOAL #2   Title Pt drainage from wounds on right ankle to be scant to decrease risk of infection    Time 3   Period Weeks   Status On-going     PT SHORT TERM GOAL #3   Title Pt wound size to have decreased by 3 cm x 3 cm to decrease risk of infection    Time 3   Period Weeks   Status On-going           PT Long Term Goals - 12/28/15 1751      PT LONG TERM GOAL #1   Title Pt pain level to be no greater than a 2/10 to allow pt to be able to be on her feet for up to 40 minutes for work tasks.    Time 6   Period Weeks   Status On-going     PT LONG TERM GOAL #2   Title Pt wounds to be healed    Time 6   Period Weeks   Status On-going     PT LONG TERM GOAL #3   Title Pt to be wearing compression garments everyday and to verbalize the importance of why you need to wear compression when you have varicosities.     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 Cellulitis 12/08/2015  . (HFpEF) heart failure with preserved ejection fraction ([place]) 04/16/2013  . Mitral regurgitation   . Anemia   . HTN (hypertension)   . Atrial fibrillation ([place]) 04/02/2009  . DIASTOLIC HEART FAILURE,  CHRONIC 04/02/2009   Ihor [place], [place]; [place]  Aldona Lento 01/12/2016, 10:19 AM Rayetta Humphrey, PT CLT [place] [address] [place], [place], [address] Phone: [phone]   Fax:  [phone]  Name: Jody [name] MRN: [id number] Date of Birth: 1927/08/25

## 2016-01-14 ENCOUNTER — Telehealth: Payer: Self-pay | Admitting: Family

## 2016-01-14 ENCOUNTER — Ambulatory Visit (HOSPITAL_COMMUNITY): Payer: Medicare Other

## 2016-01-14 NOTE — Telephone Encounter (Signed)
Spoke with pt, forms will be ready on Monday when [place] returns

## 2016-01-19 ENCOUNTER — Ambulatory Visit (HOSPITAL_COMMUNITY): Payer: Medicare Other | Admitting: Physical Therapy

## 2016-01-19 DIAGNOSIS — I83013 Varicose veins of right lower extremity with ulcer of ankle: Secondary | ICD-10-CM

## 2016-01-19 DIAGNOSIS — R262 Difficulty in walking, not elsewhere classified: Secondary | ICD-10-CM

## 2016-01-19 DIAGNOSIS — M25571 Pain in right ankle and joints of right foot: Secondary | ICD-10-CM | POA: Diagnosis not present

## 2016-01-19 DIAGNOSIS — L97319 Non-pressure chronic ulcer of right ankle with unspecified severity: Secondary | ICD-10-CM

## 2016-01-19 NOTE — Therapy (Addendum)
[place], [place], [address] Phone: [phone]   Fax:  [phone]  Wound Care Therapy  Patient Details  Name: Jody [name] MRN: [id number] Date of Birth: 26-Mar-1928 Referring Provider: Sharion Balloon   Encounter Date: 01/19/2016      PT End of Session - 01/19/16 1219    Visit Number 8   Number of Visits 13   Date for PT Re-Evaluation 01/25/16   Authorization Type UHC medicare (G-codes done 5th session)  cert good 0000000   Authorization - Visit Number 8   Authorization - Number of Visits 15   PT Start Time 0904   PT Stop Time 0934   PT Time Calculation (min) 30 min   Activity Tolerance Patient limited by pain   Behavior During Therapy [facility] for tasks assessed/performed      Past Medical History:  Diagnosis Date  . Afib ([place])   . Anemia   . CAD (coronary artery disease)   . Chronic diastolic heart failure ([place])   . HTN (hypertension)   . Mitral regurgitation    Moderate, echo, 07/2012  . Pulmonary hypertension     Past Surgical History:  Procedure Laterality Date  . JOINT REPLACEMENT Right Feb 2015   Dr. Case- Ledell Noss  . TOTAL KNEE ARTHROPLASTY Left     There were no vitals filed for this visit.                Wound Therapy - 01/19/16 0946    Subjective Pt has ran out of pain medication.  She is not wearing compression on her leg due to not realizing that she could place her compression hose over her dressings.     Patient and Family Stated Goals For the wound to heal    Date of Onset 10/22/15   Prior Treatments self care and antibiotics    Pain Assessment 0-10   Pain Score 8   out of pain medication    Evaluation and Treatment Procedures Explained to Patient/Family Yes   Evaluation and Treatment Procedures agreed to   Wound Properties Date First Assessed: 12/03/15 Time First Assessed: 1030 Wound Type: Other (Comment) Location: Ankle Location Orientation: Right Wound  Description (Comments): medial aspect  Final Assessment Date: 01/19/16 Final Assessment Time: 0910   Dressing Type --  xeroform, gauze and netting   Dressing Status --   Dressing Change Frequency --   % Wound base Red or Granulating --   % Wound base Yellow --   Peri-wound Assessment --  dry skin   Drainage Amount --   Drainage Description --   Treatment Cleansed   Wound Properties Date First Assessed: 12/03/15 Time First Assessed: 0951 Wound Type: Other (Comment) Location: Ankle Location Orientation: Right Wound Description (Comments): lateral aspect Present on Admission: Yes   Dressing Type Gauze (Comment);Other (Comment)  xeroform, gauze and netting   Dressing Changed Changed   Dressing Status Old drainage   Dressing Change Frequency PRN   Site / Wound Assessment Friable;Painful;Yellow   % Wound base Red or Granulating 90%   % Wound base Yellow 10%   Peri-wound Assessment Edema   Drainage Amount Scant   Drainage Description Serosanguineous;No odor   Treatment Cleansed;Debridement (Selective)   Selective Debridement - Location lateral wound    Selective Debridement - Tools Used Forceps   Selective Debridement - Tissue Removed slough    Wound Therapy - Clinical Statement Pt medial wound has healed.  Lateral wound continues  to slowly close in.  Pt has increased edema and pain which therapis feels is due to varicose veins.  Pt had extra cotton placed over dressing followed by kerlix and coban from MTP to knee for compression.    Wound Therapy - Functional Problem List pain limiting walking,    Factors Delaying/Impairing Wound Healing Infection - systemic/local;Vascular compromise   Hydrotherapy Plan Debridement;Dressing change;Patient/family education   Wound Therapy - Frequency Other (comment)  Reduce to 1x/week    Wound Therapy - Current Recommendations PT   Wound Plan Pt education, wound care inclucding cleansing, debridement and appropriate dressing changes.   Dressing  silver  hydrofiber, cotton,, kling, netting                   PT Short Term Goals - 12/28/15 1750      PT SHORT TERM GOAL #1   Title Pt pain in her right ankle to be no greater than a 5/10 to allow pt to be on her feet for more than 30 minutes.   Time 3   Period Weeks   Status Achieved     PT SHORT TERM GOAL #2   Title Pt drainage from wounds on right ankle to be scant to decrease risk of infection    Time 3   Period Weeks   Status On-going     PT SHORT TERM GOAL #3   Title Pt wound size to have decreased by 3 cm x 3 cm to decrease risk of infection    Time 3   Period Weeks   Status On-going           PT Long Term Goals - 12/28/15 1751      PT LONG TERM GOAL #1   Title Pt pain level to be no greater than a 2/10 to allow pt to be able to be on her feet for up to 40 minutes for work tasks.    Time 6   Period Weeks   Status On-going     PT LONG TERM GOAL #2   Title Pt wounds to be healed    Time 6   Period Weeks   Status On-going     PT LONG TERM GOAL #3   Title Pt to be wearing compression garments everyday and to verbalize the importance of why you need to wear compression when you have varicosities.     Time 6   Period Weeks   Status On-going               Plan - 01/19/16 1219    Clinical Impression Statement see above    Rehab Potential Good   PT Frequency 2x / week   PT Duration 6 weeks   PT Treatment/Interventions ADLs/Self Care Home Management;Patient/family education;Other (comment)   PT Next Visit Plan Assess how increasing cotton and using coban did for pt discomfort.  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 Cellulitis 12/08/2015  . (  HFpEF) heart failure with preserved ejection fraction ([place]) 04/16/2013  . Mitral regurgitation   . Anemia   . HTN (hypertension)   . Atrial fibrillation ([place]) 04/02/2009  . DIASTOLIC HEART FAILURE, CHRONIC 04/02/2009    Rayetta Humphrey, PT CLT [phone] 01/19/2016, 12:21 PM  [place] [address] [place], [place], [address] Phone: [phone]   Fax:  [phone]  Name: Jody [name] MRN: [id number] Date of Birth: 27-Jun-1927

## 2016-01-21 ENCOUNTER — Ambulatory Visit (HOSPITAL_COMMUNITY): Payer: Medicare Other | Admitting: Physical Therapy

## 2016-01-22 ENCOUNTER — Ambulatory Visit (INDEPENDENT_AMBULATORY_CARE_PROVIDER_SITE_OTHER): Payer: Medicare Other | Admitting: Family

## 2016-01-22 ENCOUNTER — Encounter: Payer: Self-pay | Admitting: Family

## 2016-01-22 VITALS — BP 128/74 | HR 92 | Temp 97.0°F | Ht 62.0 in | Wt 114.0 lb

## 2016-01-22 DIAGNOSIS — L03115 Cellulitis of right lower limb: Secondary | ICD-10-CM

## 2016-01-22 DIAGNOSIS — L98499 Non-pressure chronic ulcer of skin of other sites with unspecified severity: Secondary | ICD-10-CM

## 2016-01-22 DIAGNOSIS — Z5189 Encounter for other specified aftercare: Secondary | ICD-10-CM

## 2016-01-22 DIAGNOSIS — IMO0002 Reserved for concepts with insufficient information to code with codable children: Secondary | ICD-10-CM

## 2016-01-22 MED ORDER — TRAMADOL HCL 50 MG PO TABS: 50.0000 mg | ORAL_TABLET | Freq: Three times a day (TID) | ORAL | 2 refills | Status: DC | PRN

## 2016-01-22 NOTE — Progress Notes (Signed)
   Subjective:    Patient ID: Jody [name], female    DOB: [birth year], 82 y.o.   MRN: [id number]  PT presents to the office today to recheck cellulitis and right lower leg . Pt was admitted to the hospital on  12/08/15 and was discharged on 12/14/15 for cellulitis of right lower leg. PT was given IV antibiotics and discharged home on Cipro. Pt had a positive culture of pseudomonas that was sensitive to cipro. Pt goes to wound care weekly for dressing changes. PT states her wound continues to improve. PT states her pain is a 5 out 10.  Wound Check  She was originally treated more than 14 days ago. Previous treatment included oral antibiotics and IV/IM antibiotics. Her temperature was unmeasured prior to arrival. There has been clear discharge from the wound. There is no redness present. There is no swelling present. The pain has improved. She has no difficulty moving the affected extremity or digit.      Review of Systems  Skin: Positive for wound.       Objective:   Physical Exam  Cardiovascular: Normal rate and regular rhythm.   Murmur heard. Pulmonary/Chest: Effort normal and breath sounds normal.  Skin:  Ulcer on right lateral ankle, 1.5 cmX0.5cm. Slightly erythemas No discharge present. Greatly improved since last visit!     Area cleaned and gauze applied and gauze kling wrap applied  BP 128/74   Pulse 92   Temp 97 F (36.1 C) (Oral)   Ht 5\' 2"  (1.575 m)   Wt 114 lb (51.7 kg)   BMI 20.85 kg/m      Assessment & Plan:  1. Cellulitis of leg, right - traMADol (ULTRAM) 50 MG tablet; Take 1-2 tablets (50-100 mg total) by mouth every 8 (eight) hours as needed.  Dispense: 60 tablet; Refill: 2  2. Ulcer ([facility]) - traMADol (ULTRAM) 50 MG tablet; Take 1-2 tablets (50-100 mg total) by mouth every 8 (eight) hours as needed.  Dispense: 60 tablet; Refill: 2  3. Visit for wound check - traMADol (ULTRAM) 50 MG tablet; Take 1-2 tablets (50-100 mg total) by mouth every 8 (eight) hours  as needed.  Dispense: 60 tablet; Refill: 2   Continue with wound care Keep clean and dry RTO in one month Gave patient a note out of work for next 30 days. Until wound is completely healed.   Evelina Dun, FNP

## 2016-01-22 NOTE — Patient Instructions (Signed)
Keep appt with wound care!

## 2016-01-23 DIAGNOSIS — M1990 Unspecified osteoarthritis, unspecified site: Secondary | ICD-10-CM | POA: Diagnosis not present

## 2016-01-23 DIAGNOSIS — I10 Essential (primary) hypertension: Secondary | ICD-10-CM | POA: Diagnosis not present

## 2016-01-23 DIAGNOSIS — Z7902 Long term (current) use of antithrombotics/antiplatelets: Secondary | ICD-10-CM | POA: Diagnosis not present

## 2016-01-23 DIAGNOSIS — I4891 Unspecified atrial fibrillation: Secondary | ICD-10-CM | POA: Diagnosis not present

## 2016-01-23 DIAGNOSIS — I252 Old myocardial infarction: Secondary | ICD-10-CM | POA: Diagnosis not present

## 2016-01-23 DIAGNOSIS — M545 Low back pain: Secondary | ICD-10-CM | POA: Diagnosis not present

## 2016-01-23 DIAGNOSIS — M4856XA Collapsed vertebra, not elsewhere classified, lumbar region, initial encounter for fracture: Secondary | ICD-10-CM | POA: Diagnosis not present

## 2016-01-23 DIAGNOSIS — M5416 Radiculopathy, lumbar region: Secondary | ICD-10-CM | POA: Diagnosis not present

## 2016-01-26 ENCOUNTER — Ambulatory Visit (HOSPITAL_COMMUNITY): Payer: Medicare Other | Admitting: Physical Therapy

## 2016-01-26 DIAGNOSIS — M25571 Pain in right ankle and joints of right foot: Secondary | ICD-10-CM | POA: Diagnosis not present

## 2016-01-26 DIAGNOSIS — R262 Difficulty in walking, not elsewhere classified: Secondary | ICD-10-CM | POA: Diagnosis not present

## 2016-01-26 DIAGNOSIS — L97319 Non-pressure chronic ulcer of right ankle with unspecified severity: Secondary | ICD-10-CM

## 2016-01-26 DIAGNOSIS — I83013 Varicose veins of right lower extremity with ulcer of ankle: Secondary | ICD-10-CM | POA: Diagnosis not present

## 2016-01-26 NOTE — Addendum Note (Signed)
Addended by: Leeroy Cha on: 01/26/2016 09:50 AM   Modules accepted: Orders

## 2016-01-26 NOTE — Therapy (Signed)
[place], [place], [address] Phone: [phone]   Fax:  [phone]  Wound Care Therapy  Patient Details  Name: Jody [name] MRN: [id number] Date of Birth: May 30, 1927 Referring Provider: Sharion Balloon   Encounter Date: 01/26/2016      PT End of Session - 01/26/16 0938    Visit Number 9   Number of Visits 13   Date for PT Re-Evaluation 01/25/16   Authorization Type [employer] medicare (G-codes done 5th session)     Authorization - Visit Number 9   Authorization - Number of Visits 15   PT Start Time 0900   PT Stop Time 0920   PT Time Calculation (min) 20 min   Activity Tolerance Patient limited by pain   Behavior During Therapy [facility] for tasks assessed/performed      Past Medical History:  Diagnosis Date  . Afib ([place])   . Anemia   . CAD (coronary artery disease)   . Chronic diastolic heart failure ([place])   . HTN (hypertension)   . Mitral regurgitation    Moderate, echo, 07/2012  . Pulmonary hypertension     Past Surgical History:  Procedure Laterality Date  . JOINT REPLACEMENT Right Feb 2015   Dr. Case- [facility] Noss  . TOTAL KNEE ARTHROPLASTY Left     There were no vitals filed for this visit.                  Wound Therapy - 01/26/16 0928    Subjective Pt states that she went to the MD. Friday who took her bandage off.  She replaced the bandage but it was too tight and by Sunday she had to cut the top portion off.       Patient and Family Stated Goals For the wound to heal    Date of Onset 10/22/15   Prior Treatments self care and antibiotics    Pain Assessment 0-10   Pain Score 4    Evaluation and Treatment Procedures Explained to Patient/Family Yes   Evaluation and Treatment Procedures agreed to   Wound Properties Date First Assessed: 12/03/15 Time First Assessed: 0951 Wound Type: Other (Comment) Location: Ankle Location Orientation: Right Wound Description (Comments): lateral aspect  Present on Admission: Yes   Dressing Type Non adherent;Other (Comment)  xeroform, gauze and netting   Dressing Changed Changed   Dressing Status Old drainage   Dressing Change Frequency PRN   Site / Wound Assessment Friable;Painful;Yellow   % Wound base Red or Granulating 90%   % Wound base Yellow 10%   Peri-wound Assessment Edema  located above cut compression    Drainage Amount Scant   Drainage Description No odor  slight green tinge    Treatment Cleansed;Other (Comment)  manual completed to decrease segmental swelling   Selective Debridement - Location --   Selective Debridement - Tools Used --   Selective Debridement - Tissue Removed --   Wound Therapy - Clinical Statement Pt with segmental selling due to coban being cut by patient.  Pt has increased redness of 5x5 cm along lateral aspect of wound but no heat noted.  Therapist believes redness is due to dressing being to tight and then pt "cutting" the wound.  Therapist explained that you are not to cut the dressing but take one layer off at a time.     Wound Therapy - Functional Problem List Pain is better now back pain is what is limiting ambulation ability.  Factors Delaying/Impairing Wound Healing Vascular compromise   Hydrotherapy Plan Debridement;Dressing change;Patient/family education   Wound Therapy - Frequency Other (comment)  Reduce to 1x/week    Wound Therapy - Current Recommendations PT   Wound Plan anticipate discharge from therapy next treatment.  Pt was reminded to bring her compression hose to treatment.    Dressing  xeroform, cotton, kling and coban followed by netting                  PT Education - 01/26/16 0937    Education provided Yes   Education Details Reminded pt not to cut the dressing rather take one layer off at a time if dressing feels to tight .    Person(s) Educated Patient   Methods Explanation   Comprehension Verbalized understanding          PT Short Term Goals - 01/26/16  0940      PT SHORT TERM GOAL #1   Title Pt pain in her right ankle to be no greater than a 5/10 to allow pt to be on her feet for more than 30 minutes.   Time 3   Period Weeks   Status Achieved     PT SHORT TERM GOAL #2   Title Pt drainage from wounds on right ankle to be scant to decrease risk of infection    Time 3   Period Weeks   Status On-going     PT SHORT TERM GOAL #3   Title Pt wound size to have decreased by 3 cm x 3 cm to decrease risk of infection    Time 3   Period Weeks   Status On-going           PT Long Term Goals - 01/26/16 0940      PT LONG TERM GOAL #1   Title Pt pain level to be no greater than a 2/10 to allow pt to be able to be on her feet for up to 40 minutes for work tasks.    Time 6   Period Weeks   Status On-going     PT LONG TERM GOAL #2   Title Pt wounds to be healed    Time 6   Period Weeks   Status On-going     PT LONG TERM GOAL #3   Title Pt to be wearing compression garments everyday and to verbalize the importance of why you need to wear compression when you have varicosities.     Time 6   Period Weeks   Status On-going               Plan - 01/26/16 0939    Clinical Impression Statement see above    Rehab Potential Good   PT Frequency 2x / week  now decreased to one time a week    PT Duration 6 weeks   PT Treatment/Interventions ADLs/Self Care Home Management;Patient/family education;Other (comment)   PT Next Visit Plan Assess how increasing cotton and using coban did for pt discomfort.  Encourage pt to be using her compression garment    Consulted and Agree with Plan of Care Patient      Patient will benefit from skilled therapeutic intervention in order to improve the following deficits and impairments:  Increased edema, Difficulty walking, Pain, Other (comment) (progreassive wound )  Visit Diagnosis: Pain in right ankle and joints of right foot  Difficulty in walking, not elsewhere classified  Varicose veins  of right lower extremity with ulcer of  ankle [facility])     Problem List Patient Active Problem List   Diagnosis Date Noted  . Cellulitis 12/08/2015  . (HFpEF) heart failure with preserved ejection fraction ([place]) 04/16/2013  . Mitral regurgitation   . Anemia   . HTN (hypertension)   . Atrial fibrillation ([place]) 04/02/2009  . DIASTOLIC HEART FAILURE, CHRONIC 04/02/2009    Rayetta Humphrey, PT CLT [phone] 01/26/2016, 9:41 AM  [place] [address] [place], [place], [address] Phone: [phone]   Fax:  [phone]  Name: Jody [name] MRN: [id number] Date of Birth: Nov 09, 1927

## 2016-01-28 ENCOUNTER — Ambulatory Visit (HOSPITAL_COMMUNITY): Payer: Medicare Other | Admitting: Physical Therapy

## 2016-02-02 ENCOUNTER — Ambulatory Visit (HOSPITAL_COMMUNITY): Payer: Medicare Other | Admitting: Physical Therapy

## 2016-02-02 DIAGNOSIS — M5442 Lumbago with sciatica, left side: Secondary | ICD-10-CM | POA: Diagnosis not present

## 2016-02-03 ENCOUNTER — Ambulatory Visit (HOSPITAL_COMMUNITY): Payer: Medicare Other | Attending: Family | Admitting: Physical Therapy

## 2016-02-03 DIAGNOSIS — M25571 Pain in right ankle and joints of right foot: Secondary | ICD-10-CM | POA: Insufficient documentation

## 2016-02-03 DIAGNOSIS — I83013 Varicose veins of right lower extremity with ulcer of ankle: Secondary | ICD-10-CM | POA: Insufficient documentation

## 2016-02-03 DIAGNOSIS — R262 Difficulty in walking, not elsewhere classified: Secondary | ICD-10-CM | POA: Diagnosis not present

## 2016-02-03 DIAGNOSIS — L97319 Non-pressure chronic ulcer of right ankle with unspecified severity: Secondary | ICD-10-CM

## 2016-02-03 NOTE — Therapy (Signed)
[place], [place], [address] Phone: [phone]   Fax:  [phone]  Wound Care Therapy  Patient Details  Name: Jody [name] MRN: [id number] Date of Birth: [birth year] Referring Provider: Sharion Balloon   Encounter Date: 02/03/2016      PT End of Session - 02/03/16 0929    Visit Number 10   Number of Visits 13   Date for PT Re-Evaluation 01/25/16   Authorization Type [employer] medicare (G-codes done 5th session)     Authorization - Visit Number 10   Authorization - Number of Visits 15   PT Start Time 0904   PT Stop Time 0916   PT Time Calculation (min) 12 min   Activity Tolerance Patient tolerated treatment well   Behavior During Therapy [facility] for tasks assessed/performed      Past Medical History:  Diagnosis Date  . Afib ([place])   . Anemia   . CAD (coronary artery disease)   . Chronic diastolic heart failure ([place])   . HTN (hypertension)   . Mitral regurgitation    Moderate, echo, 07/2012  . Pulmonary hypertension     Past Surgical History:  Procedure Laterality Date  . JOINT REPLACEMENT Right Feb 2015   Dr. Case- [facility] Noss  . TOTAL KNEE ARTHROPLASTY Left     There were no vitals filed for this visit.                  Wound Therapy - 02/03/16 0924    Subjective Pt comes today with dressing intact on Rt LE, compression hose on Lt.  No pain, states she thinks it is healed.   Pain Assessment No/denies pain   Wound Properties Date First Assessed: 12/03/15 Time First Assessed: 0951 Wound Type: Other (Comment) Location: Ankle Location Orientation: Right Wound Description (Comments): lateral aspect Present on Admission: Yes   Wound Therapy - Clinical Statement Dressing removed to reveal wound completely healed beneath.  no signs/symptoms of new areas and no disruptions of surrounding area. Instructed patient to don compression garment on Rt LE when returned home and to wear daily.  Instructed to remove  at night to properly cleanse and moisturize LE.  Pt verbalized understanding.     Wound Plan discharge as no longer with open wound or skilled need.                  PT Education - 02/03/16 [phone]    Education provided Yes   Education Details Wear compression hose daily, removal at night for cleansing and moisturizing   Person(s) Educated Patient   Methods Explanation   Comprehension Verbalized understanding          PT Short Term Goals - 02/03/16 0928      PT SHORT TERM GOAL #1   Title Pt pain in her right ankle to be no greater than a 5/10 to allow pt to be on her feet for more than 30 minutes.   Time 3   Period Weeks   Status Achieved     PT SHORT TERM GOAL #2   Title Pt drainage from wounds on right ankle to be scant to decrease risk of infection    Time 3   Period Weeks   Status Achieved     PT SHORT TERM GOAL #3   Title Pt wound size to have decreased by 3 cm x 3 cm to decrease risk of infection    Time 3   Period  Weeks   Status Achieved           PT Long Term Goals - 02/10/2016 0928      PT LONG TERM GOAL #1   Title Pt pain level to be no greater than a 2/10 to allow pt to be able to be on her feet for up to 40 minutes for work tasks.    Time 6   Period Weeks   Status Achieved     PT LONG TERM GOAL #2   Title Pt wounds to be healed    Time 6   Period Weeks   Status Achieved     PT LONG TERM GOAL #3   Title Pt to be wearing compression garments everyday and to verbalize the importance of why you need to wear compression when you have varicosities.     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 Cellulitis 12/08/2015  . (HFpEF) heart failure with preserved ejection fraction ([place]) 04/16/2013  . Mitral regurgitation   . Anemia   . HTN (hypertension)   . Atrial fibrillation ([place]) 04/02/2009  . DIASTOLIC HEART FAILURE, CHRONIC 04/02/2009    Teena Irani, PTA/CLT [phone]  02/04/2016, 12:26 PM  [place], [place], [address] Phone: [phone]   Fax:  [phone]  Name: Jody [name] MRN: [id number] Date of Birth: 03-02-1928    PHYSICAL THERAPY DISCHARGE SUMMARY  Visits from Start of Care: 10  Current functional level related to goals / functional outcomes: See above    Remaining deficits: See above   Education / Equipment: HEP Plan: Patient agrees to discharge.  Patient goals were met. Patient is being discharged due to meeting the stated rehab goals.  ?????        Rayetta Humphrey, [place] CLT [phone]

## 2016-02-06 DIAGNOSIS — M5442 Lumbago with sciatica, left side: Secondary | ICD-10-CM | POA: Diagnosis not present

## 2016-02-06 DIAGNOSIS — M545 Low back pain: Secondary | ICD-10-CM | POA: Diagnosis not present

## 2016-02-08 DIAGNOSIS — R030 Elevated blood-pressure reading, without diagnosis of hypertension: Secondary | ICD-10-CM | POA: Diagnosis not present

## 2016-02-09 ENCOUNTER — Ambulatory Visit (HOSPITAL_COMMUNITY): Payer: Medicare Other | Admitting: Physical Therapy

## 2016-02-10 ENCOUNTER — Telehealth: Payer: Self-pay | Admitting: Family

## 2016-02-10 DIAGNOSIS — H43813 Vitreous degeneration, bilateral: Secondary | ICD-10-CM | POA: Diagnosis not present

## 2016-02-10 DIAGNOSIS — H52223 Regular astigmatism, bilateral: Secondary | ICD-10-CM | POA: Diagnosis not present

## 2016-02-10 DIAGNOSIS — H5203 Hypermetropia, bilateral: Secondary | ICD-10-CM | POA: Diagnosis not present

## 2016-02-10 DIAGNOSIS — H524 Presbyopia: Secondary | ICD-10-CM | POA: Diagnosis not present

## 2016-02-10 NOTE — Telephone Encounter (Signed)
Forms will be done per nurse at [facility].

## 2016-02-12 ENCOUNTER — Ambulatory Visit (INDEPENDENT_AMBULATORY_CARE_PROVIDER_SITE_OTHER): Payer: Medicare Other | Admitting: Family

## 2016-02-12 ENCOUNTER — Encounter: Payer: Self-pay | Admitting: Family

## 2016-02-12 VITALS — BP 135/85 | HR 82 | Temp 98.1°F | Ht 62.0 in | Wt 117.2 lb

## 2016-02-12 DIAGNOSIS — L03115 Cellulitis of right lower limb: Secondary | ICD-10-CM | POA: Diagnosis not present

## 2016-02-12 DIAGNOSIS — Z5189 Encounter for other specified aftercare: Secondary | ICD-10-CM

## 2016-02-12 NOTE — Progress Notes (Signed)
   Subjective:    Patient ID: Jody [name], female    DOB: [birth year], 82 y.o.   MRN: [id number]  PT presents to the office today to recheck cellulitis and right lower leg . Pt was released from wound care on 02/04/16. PT states she is doing much better. She denies any pain, discharge, redness or [place]. Wound Check  She was originally treated more than 14 days ago. Previous treatment included oral antibiotics and IV/IM antibiotics. Her temperature was unmeasured prior to arrival. There has been no drainage from the wound. There is no redness present. There is no swelling present. There is no pain present. She has no difficulty moving the affected extremity or digit.      Review of Systems  All other systems reviewed and are negative.      Objective:   Physical Exam  Constitutional: She is oriented to person, place, and time. She appears well-developed and well-nourished. No distress.  HENT:  Head: Normocephalic.  Cardiovascular: Normal rate, regular rhythm and intact distal pulses.   Murmur heard. Pulmonary/Chest: Effort normal and breath sounds normal. No respiratory distress. She has no wheezes.  Abdominal: Soft. Bowel sounds are normal. She exhibits no distension. There is no tenderness.  Musculoskeletal: Normal range of motion. She exhibits no edema or tenderness.  Neurological: She is alert and oriented to person, place, and time.  Skin: Skin is warm and dry.  Psychiatric: She has a normal mood and affect. Her behavior is normal. Judgment and thought content normal.  Vitals reviewed.    BP 135/85   Pulse 82   Temp 98.1 F (36.7 C) (Oral)   Ht 5\' 2"  (1.575 m)   Wt 117 lb 3.2 oz (53.2 kg)   BMI 21.44 kg/m      Assessment & Plan:  1. Encounter for wound re-check  2. Cellulitis of right lower extremity  Resolved  Work papers filled out to return to work without restrictions 02/22/16 RTO prn   Evelina Dun, FNP

## 2016-02-12 NOTE — Patient Instructions (Signed)
Health Maintenance, Female Adopting a healthy lifestyle and getting preventive care can go a long way to promote health and wellness. Talk with your health care provider about what schedule of regular examinations is right for you. This is a good chance for you to check in with your provider about disease prevention and staying healthy. In between checkups, there are plenty of things you can do on your own. Experts have done a lot of research about which lifestyle changes and preventive measures are most likely to keep you healthy. Ask your health care provider for more information. WEIGHT AND DIET  Eat a healthy diet  Be sure to include plenty of vegetables, fruits, low-fat dairy products, and lean protein.  Do not eat a lot of foods high in solid fats, added sugars, or salt.  Get regular exercise. This is one of the most important things you can do for your health.  Most adults should exercise for at least 150 minutes each week. The exercise should increase your heart rate and make you sweat (moderate-intensity exercise).  Most adults should also do strengthening exercises at least twice a week. This is in addition to the moderate-intensity exercise.  Maintain a healthy weight  Body mass index (BMI) is a measurement that can be used to identify possible weight problems. It estimates body fat based on height and weight. Your health care provider can help determine your BMI and help you achieve or maintain a healthy weight.  For females 20 years of age and older:   A BMI below 18.5 is considered underweight.  A BMI of 18.5 to 24.9 is normal.  A BMI of 25 to 29.9 is considered overweight.  A BMI of 30 and above is considered obese.  Watch levels of cholesterol and blood lipids  You should start having your blood tested for lipids and cholesterol at 80 years of age, then have this test every 5 years.  You may need to have your cholesterol levels checked more often if:  Your lipid  or cholesterol levels are high.  You are older than 80 years of age.  You are at high risk for heart disease.  CANCER SCREENING   Lung Cancer  Lung cancer screening is recommended for adults 55-80 years old who are at high risk for lung cancer because of a history of smoking.  A yearly low-dose CT scan of the lungs is recommended for people who:  Currently smoke.  Have quit within the past 15 years.  Have at least a 30-pack-year history of smoking. A pack year is smoking an average of one pack of cigarettes a day for 1 year.  Yearly screening should continue until it has been 15 years since you quit.  Yearly screening should stop if you develop a health problem that would prevent you from having lung cancer treatment.  Breast Cancer  Practice breast self-awareness. This means understanding how your breasts normally appear and feel.  It also means doing regular breast self-exams. Let your health care provider know about any changes, no matter how small.  If you are in your 20s or 30s, you should have a clinical breast exam (CBE) by a health care provider every 1-3 years as part of a regular health exam.  If you are 40 or older, have a CBE every year. Also consider having a breast X-ray (mammogram) every year.  If you have a family history of breast cancer, talk to your health care provider about genetic screening.  If you   are at high risk for breast cancer, talk to your health care provider about having an MRI and a mammogram every year.  Breast cancer gene (BRCA) assessment is recommended for women who have family members with BRCA-related cancers. BRCA-related cancers include:  Breast.  Ovarian.  Tubal.  Peritoneal cancers.  Results of the assessment will determine the need for genetic counseling and BRCA1 and BRCA2 testing. Cervical Cancer Your health care provider may recommend that you be screened regularly for cancer of the pelvic organs (ovaries, uterus, and  vagina). This screening involves a pelvic examination, including checking for microscopic changes to the surface of your cervix (Pap test). You may be encouraged to have this screening done every 3 years, beginning at age 21.  For women ages 30-65, health care providers may recommend pelvic exams and Pap testing every 3 years, or they may recommend the Pap and pelvic exam, combined with testing for human papilloma virus (HPV), every 5 years. Some types of HPV increase your risk of cervical cancer. Testing for HPV may also be done on women of any age with unclear Pap test results.  Other health care providers may not recommend any screening for nonpregnant women who are considered low risk for pelvic cancer and who do not have symptoms. Ask your health care provider if a screening pelvic exam is right for you.  If you have had past treatment for cervical cancer or a condition that could lead to cancer, you need Pap tests and screening for cancer for at least 20 years after your treatment. If Pap tests have been discontinued, your risk factors (such as having a new sexual partner) need to be reassessed to determine if screening should resume. Some women have medical problems that increase the chance of getting cervical cancer. In these cases, your health care provider may recommend more frequent screening and Pap tests. Colorectal Cancer  This type of cancer can be detected and often prevented.  Routine colorectal cancer screening usually begins at 80 years of age and continues through 80 years of age.  Your health care provider may recommend screening at an earlier age if you have risk factors for colon cancer.  Your health care provider may also recommend using home test kits to check for hidden blood in the stool.  A small camera at the end of a tube can be used to examine your colon directly (sigmoidoscopy or colonoscopy). This is done to check for the earliest forms of colorectal  cancer.  Routine screening usually begins at age 50.  Direct examination of the colon should be repeated every 5-10 years through 80 years of age. However, you may need to be screened more often if early forms of precancerous polyps or small growths are found. Skin Cancer  Check your skin from head to toe regularly.  Tell your health care provider about any new moles or changes in moles, especially if there is a change in a mole's shape or color.  Also tell your health care provider if you have a mole that is larger than the size of a pencil eraser.  Always use sunscreen. Apply sunscreen liberally and repeatedly throughout the day.  Protect yourself by wearing long sleeves, pants, a wide-brimmed hat, and sunglasses whenever you are outside. HEART DISEASE, DIABETES, AND HIGH BLOOD PRESSURE   High blood pressure causes heart disease and increases the risk of stroke. High blood pressure is more likely to develop in:  People who have blood pressure in the high end   of the normal range (130-139/85-89 mm Hg).  People who are overweight or obese.  People who are African American.  If you are 38-23 years of age, have your blood pressure checked every 3-5 years. If you are 61 years of age or older, have your blood pressure checked every year. You should have your blood pressure measured twice--once when you are at a hospital or clinic, and once when you are not at a hospital or clinic. Record the average of the two measurements. To check your blood pressure when you are not at a hospital or clinic, you can use:  An automated blood pressure machine at a pharmacy.  A home blood pressure monitor.  If you are between 45 years and 39 years old, ask your health care provider if you should take aspirin to prevent strokes.  Have regular diabetes screenings. This involves taking a blood sample to check your fasting blood sugar level.  If you are at a normal weight and have a low risk for diabetes,  have this test once every three years after 80 years of age.  If you are overweight and have a high risk for diabetes, consider being tested at a younger age or more often. PREVENTING INFECTION  Hepatitis B  If you have a higher risk for hepatitis B, you should be screened for this virus. You are considered at high risk for hepatitis B if:  You were born in a country where hepatitis B is common. Ask your health care provider which countries are considered high risk.  Your parents were born in a high-risk country, and you have not been immunized against hepatitis B (hepatitis B vaccine).  You have HIV or AIDS.  You use needles to inject street drugs.  You live with someone who has hepatitis B.  You have had sex with someone who has hepatitis B.  You get hemodialysis treatment.  You take certain medicines for conditions, including cancer, organ transplantation, and autoimmune conditions. Hepatitis C  Blood testing is recommended for:  Everyone born from [birth year] through 1963.  Anyone with known risk factors for hepatitis C. Sexually transmitted infections (STIs)  You should be screened for sexually transmitted infections (STIs) including gonorrhea and chlamydia if:  You are sexually active and are younger than 80 years of age.  You are older than 80 years of age and your health care provider tells you that you are at risk for this type of infection.  Your sexual activity has changed since you were last screened and you are at an increased risk for chlamydia or gonorrhea. Ask your health care provider if you are at risk.  If you do not have HIV, but are at risk, it may be recommended that you take a prescription medicine daily to prevent HIV infection. This is called pre-exposure prophylaxis (PrEP). You are considered at risk if:  You are sexually active and do not regularly use condoms or know the HIV status of your partner(s).  You take drugs by injection.  You are sexually  active with a partner who has HIV. Talk with your health care provider about whether you are at high risk of being infected with HIV. If you choose to begin PrEP, you should first be tested for HIV. You should then be tested every 3 months for as long as you are taking PrEP.  PREGNANCY   If you are premenopausal and you may become pregnant, ask your health care provider about preconception counseling.  If you may  become pregnant, take 400 to 800 micrograms (mcg) of folic acid every day.  If you want to prevent pregnancy, talk to your health care provider about birth control (contraception). OSTEOPOROSIS AND MENOPAUSE   Osteoporosis is a disease in which the bones lose minerals and strength with aging. This can result in serious bone fractures. Your risk for osteoporosis can be identified using a bone density scan.  If you are 61 years of age or older, or if you are at risk for osteoporosis and fractures, ask your health care provider if you should be screened.  Ask your health care provider whether you should take a calcium or vitamin D supplement to lower your risk for osteoporosis.  Menopause may have certain physical symptoms and risks.  Hormone replacement therapy may reduce some of these symptoms and risks. Talk to your health care provider about whether hormone replacement therapy is right for you.  HOME CARE INSTRUCTIONS   Schedule regular health, dental, and eye exams.  Stay current with your immunizations.   Do not use any tobacco products including cigarettes, chewing tobacco, or electronic cigarettes.  If you are pregnant, do not drink alcohol.  If you are breastfeeding, limit how much and how often you drink alcohol.  Limit alcohol intake to no more than 1 drink per day for nonpregnant women. One drink equals 12 ounces of beer, 5 ounces of wine, or 1 ounces of hard liquor.  Do not use street drugs.  Do not share needles.  Ask your health care provider for help if  you need support or information about quitting drugs.  Tell your health care provider if you often feel depressed.  Tell your health care provider if you have ever been abused or do not feel safe at home.   This information is not intended to replace advice given to you by your health care provider. Make sure you discuss any questions you have with your health care provider.   Document Released: 10/04/2010 Document Revised: 04/11/2014 Document Reviewed: 02/20/2013 Elsevier Interactive Patient Education [employer].

## 2016-02-16 ENCOUNTER — Ambulatory Visit (HOSPITAL_COMMUNITY): Payer: Medicare Other | Admitting: Physical Therapy

## 2016-02-23 ENCOUNTER — Ambulatory Visit (HOSPITAL_COMMUNITY): Payer: Medicare Other | Admitting: Physical Therapy

## 2016-03-01 ENCOUNTER — Ambulatory Visit (HOSPITAL_COMMUNITY): Payer: Medicare Other | Admitting: Physical Therapy

## 2016-03-15 ENCOUNTER — Ambulatory Visit (INDEPENDENT_AMBULATORY_CARE_PROVIDER_SITE_OTHER): Payer: Medicare Other | Admitting: Family Medicine

## 2016-03-15 ENCOUNTER — Encounter (INDEPENDENT_AMBULATORY_CARE_PROVIDER_SITE_OTHER): Payer: Self-pay

## 2016-03-15 ENCOUNTER — Encounter: Payer: Self-pay | Admitting: Family Medicine

## 2016-03-15 DIAGNOSIS — L97811 Non-pressure chronic ulcer of other part of right lower leg limited to breakdown of skin: Secondary | ICD-10-CM

## 2016-03-15 DIAGNOSIS — L97909 Non-pressure chronic ulcer of unspecified part of unspecified lower leg with unspecified severity: Secondary | ICD-10-CM | POA: Insufficient documentation

## 2016-03-15 DIAGNOSIS — I83018 Varicose veins of right lower extremity with ulcer other part of lower leg: Secondary | ICD-10-CM | POA: Diagnosis not present

## 2016-03-15 DIAGNOSIS — I83009 Varicose veins of unspecified lower extremity with ulcer of unspecified site: Secondary | ICD-10-CM | POA: Insufficient documentation

## 2016-03-15 MED ORDER — CEPHALEXIN 500 MG PO CAPS: 500.0000 mg | ORAL_CAPSULE | Freq: Three times a day (TID) | ORAL | 0 refills | Status: DC

## 2016-03-15 MED ORDER — MUPIROCIN 2 % EX OINT: 1.0000 "application " | TOPICAL_OINTMENT | Freq: Two times a day (BID) | CUTANEOUS | 0 refills | Status: DC

## 2016-03-15 NOTE — Patient Instructions (Signed)
Great to see you!  Please take all antibiotics, try a non stick bandage and coban to hold in place. Use mupirocin ointment twice daily over the area.   Call wound care for a follow up appointment

## 2016-03-15 NOTE — Progress Notes (Signed)
   HPI  Patient presents today here with recurrent venous stasis ulcer.  Patient states that over the last 3 days she's had tenderness of the site of her previous venous ulcer. She states that a small scab has formed. She denies any fevers, chills, sweats. She's been out of wound care for about 3-4 weeks. She's continued to wear compression stockings. She has not been bandaging the wound at all.  PMH: Smoking status noted ROS: Per HPI  Objective: BP (!) 151/83   Pulse 75   Temp (!) 96.9 F (36.1 C) (Oral)   Ht 5\' 2"  (1.575 m)   Wt 117 lb 9.6 oz (53.3 kg)   BMI 21.51 kg/m  Gen: NAD, alert, cooperative with exam HEENT: NCAT, EOMI, PERRL CV: Irregularly irregular, 2/6 systolic murmur Resp: CTABL, no wheezes, non-labored Ext: No edema, warm Neuro: Alert and oriented, No gross deficits  Skin:  3 mm X 5 mm area of ulceration with heme crusting Mild erythema at 6:00 No shortness, induration, drainage. Mild tenderness to palpation of the distal portion of the wound as well as the skin just distal to the wound.       Assessment and plan:  # Venous stasis ulcer of the right lower leg Difficult situation, patient recently with complete healing of the wound about 3-4 weeks ago. Recommended Keflex, mupirocin ointment twice daily and nonstick dressing. Also recommended return to wound care clinic given that she is on low thinners and had such an extensive wound previously I would like very close observation. Low threshold for return   Difficulty walking greater than 200 feet without stopping to rest, handicap placard form signed.  Meds ordered this encounter  Medications  . cephALEXin (KEFLEX) 500 MG capsule    Sig: Take 1 capsule (500 mg total) by mouth 3 (three) times daily.    Dispense:  21 capsule    Refill:  0  . mupirocin ointment (BACTROBAN) 2 %    Sig: Place 1 application into the nose 2 (two) times daily.    Dispense:  22 g    Refill:  0    Laroy Apple,  MD [facility] 03/15/2016, 10:52 AM

## 2016-03-18 ENCOUNTER — Telehealth: Payer: Self-pay | Admitting: Family

## 2016-03-18 ENCOUNTER — Telehealth (HOSPITAL_COMMUNITY): Payer: Self-pay | Admitting: Family

## 2016-03-18 DIAGNOSIS — I878 Other specified disorders of veins: Secondary | ICD-10-CM

## 2016-03-18 NOTE — Telephone Encounter (Signed)
Pt wants referral to [facility] Pt can not get into [place] until January Referral entered in [place]

## 2016-03-18 NOTE — Telephone Encounter (Signed)
03/18/16  Have spoke to daughter several times this week to try and get her mother in for wound care.  I gave a couple times on Wed. And Thurs but she said that her mother was working and couldn't come in.  I asked could she come here and then go back to work but she said that she worked at [employer] and they don't like for them to leave during their shift.  I told her that if we had anything to open up any other days I would call her but nothing is available today.  I told here the soonest would be Tuesday at 4:00 and she said she was going to call the wound center in [place] to see if they could see her because the wound is really red and seems to be getting worse.

## 2016-03-21 NOTE — Telephone Encounter (Signed)
Daughter aware of referral status 

## 2016-03-23 ENCOUNTER — Ambulatory Visit (HOSPITAL_COMMUNITY): Payer: Medicare Other | Attending: Family | Admitting: Physical Therapy

## 2016-03-23 DIAGNOSIS — L97311 Non-pressure chronic ulcer of right ankle limited to breakdown of skin: Secondary | ICD-10-CM | POA: Diagnosis not present

## 2016-03-23 DIAGNOSIS — M79661 Pain in right lower leg: Secondary | ICD-10-CM | POA: Insufficient documentation

## 2016-03-23 DIAGNOSIS — R262 Difficulty in walking, not elsewhere classified: Secondary | ICD-10-CM | POA: Insufficient documentation

## 2016-03-23 NOTE — Therapy (Signed)
[facility] [place], [place], [address] Phone: [phone]   Fax:  [phone]  Wound Care Evaluation  Patient Details  Name: Jody [name] MRN: [id number] Date of Birth: [birth year] Referring Provider: Timmothy Euler   Encounter Date: 03/23/2016      PT End of Session - 03/23/16 1717    Visit Number 1   Number of Visits 8   Date for PT Re-Evaluation 04/20/16   Authorization Type [employer] medicare    Authorization Time Period 03/23/16 to 04/23/16   Authorization - Visit Number 1   Authorization - Number of Visits 10   PT Start Time 1620   PT Stop Time 1700   PT Time Calculation (min) 40 min   Activity Tolerance Patient tolerated treatment well   Behavior During Therapy [facility] for tasks assessed/performed      Past Medical History:  Diagnosis Date  . Afib ([place])   . Anemia   . CAD (coronary artery disease)   . Chronic diastolic heart failure ([place])   . HTN (hypertension)   . Mitral regurgitation    Moderate, echo, 07/2012  . Pulmonary hypertension     Past Surgical History:  Procedure Laterality Date  . JOINT REPLACEMENT Right Feb 2015   Dr. Case- [facility] Noss  . TOTAL KNEE ARTHROPLASTY Left     There were no vitals filed for this visit.        [facility] PT Assessment - 03/23/16 0001      Assessment   Medical Diagnosis R LE venous stasis ulcer    Referring Provider Timmothy Euler    Onset Date/Surgical Date --  within the last 4 weeks    Next MD Visit not scheduled    Prior Therapy skilled PT wound services earlier thsi year      Precautions   Precautions None     Restrictions   Weight Bearing Restrictions No     Balance Screen   Has the patient fallen in the past 6 months No   Has the patient had a decrease in activity level because of a fear of falling?  No   Is the patient reluctant to leave their home because of a fear of falling?  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 They are concerned that the wound is going to spread again. Patient arrives today with wound dressed with gauze, xeroform, and coban.    Patient and Family Stated Goals For the wound to heal    Date of Onset --  within the past 3-4 weeks    Prior Treatments antibiotics, self-dressing with xeroform/gauze    Pain Assessment 0-10   Pain Score 5    Pain Type Acute pain   Pain Location Leg   Pain Orientation Right;Lower   Pain Onset On-going   Patients Stated Pain Goal 0   Evaluation and Treatment Procedures Explained to Patient/Family Yes   Evaluation and Treatment Procedures agreed to   Wound Properties Date First Assessed: 03/23/16 Wound Type: Venous stasis  ulcer Location: Leg Location Orientation: Right;Lower Wound Description (Comments): R lateral lower LE  Present on Admission: Yes   Dressing Type Gauze (Comment);Impregnated gauze (petrolatum);Other (Comment)  gauze, xeroform, coban    Dressing Changed Changed   Dressing Status Clean;Dry   Dressing Change Frequency PRN   % Wound base Red or Granulating 100%   % Wound base Yellow/Fibrinous Exudate 0%   % Wound base Black/Eschar 0%   Wound Length (cm) 1.1 cm   Wound Width (cm) 0.3 cm   Wound Depth (cm) --  >0.1cm    Margins Unattached edges (unapproximated)   Closure None   Drainage Amount None   Drainage Description No odor   Treatment Cleansed;Packing (Impregnated strip);Other (Comment);Pressure applied  saline, xeroform, profore lite    Wound Properties Date First Assessed: 12/03/15 Time First Assessed: 0951 Wound Type: Other (Comment) Location: Ankle Location Orientation: Right  Wound Description (Comments): lateral aspect Present on Admission: Yes   Wound Therapy - Clinical Statement Patient arrives today after experiencing wound recurrence less than one month after being discharged from skilled PT services for wound care; within the past 3-4 weeks a small area on her R LE opened back up and her MD wanted her to get back to wound clinic as soon as possible to prevent the area from getting as bad as it did before. Wound is very small at this point and 100% red granulation tissue but LE is very swollen and tender in general. Patient is on Eliquis long term so despite edema and increased heat, also due to lack of significant color change of R calf, concern over DVT/blood clot is minimal at this time. Consulted wound care PTA who had worked with patient in the past, who recommends that when wound is resolved that patient be prescribed higher grade compression stockings when wound is resolved. Dressed wound with xeroform and profore lite dressing today. Recommend that patient receive skilled PT services in order to facilitate wound healing and assist in prevention of other wounds moving forward.    Wound Therapy - Functional Problem List non-healing wound/recurring wound    Factors Delaying/Impairing Wound Healing Vascular compromise   Wound Therapy - Frequency Other (comment)  2x/week for 4 weeks     Wound Therapy - Current Recommendations PT   Wound Plan xeroform, compression wrap as appropriate                         PT Education - 03/23/16 1716    Education provided Yes   Education Details prognosis, POC; consulted woundcare PTA who had worked with patient in the past, who recommended patient upgrade compression hose to prevent wound recurrence in the future    Person(s) Educated Patient;Child(ren)   Methods Explanation   Comprehension Verbalized understanding          PT Short Term Goals - 03/23/16 1724      PT SHORT TERM GOAL #1   Title Patient  to experience pain no more than 2/10 in order to improve QOL and tolerance to CKC activities    Time 2   Period Weeks   Status New     PT SHORT TERM GOAL #2   Title Wound area to have decreased by at least 50% in order to show general improvement of condition    Time 2   Period Weeks   Status New           PT Long Term Goals - 03/23/16 1726  PT LONG TERM GOAL #1   Title Wound to have completely resolved and healed completely  with pain 0/10 to show resolution of condition    Time 4   Period Weeks   Status New     PT LONG TERM GOAL #2   Title Patient to be able to verbally state the importance of consistently wearing appropriate grade compression stockings in order to assist in preventing further wound formation    Time 4   Period Weeks   Status New            Patient will benefit from skilled therapeutic intervention in order to improve the following deficits and impairments:     Visit Diagnosis: Non-pressure chronic ulcer of right ankle limited to breakdown of skin ([place]) - Plan: PT plan of care cert/re-cert  Pain in right lower leg - Plan: PT plan of care cert/re-cert  Difficulty in walking, not elsewhere classified - Plan: PT plan of care cert/re-cert      G-Codes - [id number]    Functional Assessment Tool Used Based on skilled clinical assessment of wound progress, slough, general healing status, edema    Functional Limitation Other PT primary   Other PT Primary Current Status [id number]) At least 20 percent but less than 40 percent impaired, limited or restricted   Other PT Primary Goal Status [id number]) At least 1 percent but less than 20 percent impaired, limited or restricted      Problem List Patient Active Problem List   Diagnosis Date Noted  . Venous stasis ulcer ([place]) 03/15/2016  . Cellulitis 12/08/2015  . (HFpEF) heart failure with preserved ejection fraction ([place]) 04/16/2013  . Mitral regurgitation   . Anemia   . HTN (hypertension)   .  Atrial fibrillation ([place]) 04/02/2009  . DIASTOLIC HEART FAILURE, CHRONIC 04/02/2009    Deniece Ree PT, DPT [place] [address] [place], [place], [address] Phone: [phone]   Fax:  [phone]  Name: VERIA WEIDEMANN MRN: [id number] Date of Birth: 1927/08/03

## 2016-03-25 ENCOUNTER — Ambulatory Visit (HOSPITAL_COMMUNITY): Payer: Medicare Other

## 2016-03-25 DIAGNOSIS — R262 Difficulty in walking, not elsewhere classified: Secondary | ICD-10-CM | POA: Diagnosis not present

## 2016-03-25 DIAGNOSIS — M79661 Pain in right lower leg: Secondary | ICD-10-CM | POA: Diagnosis not present

## 2016-03-25 DIAGNOSIS — L97311 Non-pressure chronic ulcer of right ankle limited to breakdown of skin: Secondary | ICD-10-CM | POA: Diagnosis not present

## 2016-03-25 NOTE — Therapy (Signed)
[place], [place], [address] Phone: [phone]   Fax:  [phone]  Physical Therapy Treatment  Patient Details  Name: Jody [name] MRN: [id number] Date of Birth: 08-15-1927 Referring Provider: Timmothy Euler   Encounter Date: 03/25/2016      PT End of Session - 03/25/16 1110    Visit Number 2   Number of Visits 8   Date for PT Re-Evaluation 04/20/16   Authorization Type UHC medicare    Authorization Time Period 03/23/16 to 04/23/16   Authorization - Visit Number 2   Authorization - Number of Visits 10   PT Start Time [id number]   PT Stop Time 1105   PT Time Calculation (min) 30 min   Activity Tolerance Patient tolerated treatment well   Behavior During Therapy [facility] for tasks assessed/performed      Past Medical History:  Diagnosis Date  . Afib ([place])   . Anemia   . CAD (coronary artery disease)   . Chronic diastolic heart failure ([place])   . HTN (hypertension)   . Mitral regurgitation    Moderate, echo, 07/2012  . Pulmonary hypertension     Past Surgical History:  Procedure Laterality Date  . JOINT REPLACEMENT Right Feb 2015   Dr. Case- Ledell Noss  . TOTAL KNEE ARTHROPLASTY Left     There were no vitals filed for this visit.      Subjective Assessment - 03/25/16 1109    Subjective Pt stated Rt ankle is very sore today.  Dressings are intact though have slid down LE some.  CUrrent pain scale 5/10 today   Currently in Pain? Yes   Pain Score 5    Pain Location Ankle   Pain Orientation Right   Pain Descriptors / Indicators Sore   Pain Type Acute pain                       Wound Therapy - 03/25/16 1109    Subjective Pt stated Rt ankle is very sore today.  Dressings are intact though have slid down LE some.  CUrrent pain scale 5/10 today   Patient and Family Stated Goals For the wound to heal    Date of Onset 10/22/15   Prior Treatments antibiotics, self-dressing with xeroform/gauze     Pain Assessment 0-10   Pain Onset On-going   Patients Stated Pain Goal 0   Pain Intervention(s) Medication (See eMAR)   Evaluation and Treatment Procedures Explained to Patient/Family Yes   Evaluation and Treatment Procedures agreed to   Wound Properties Date First Assessed: 03/23/16 Wound Type: Venous stasis ulcer Location: Leg Location Orientation: Right;Lower Wound Description (Comments): R lateral lower LE  Present on Admission: Yes   Dressing Type Impregnated gauze (bismuth);Gauze (Comment)  Profore Lite   Dressing Changed Changed   Dressing Status Clean;Dry   Dressing Change Frequency PRN   Site / Wound Assessment Clean;Dry   % Wound base Red or Granulating 100%   % Wound base Yellow/Fibrinous Exudate 0%   Margins Unattached edges (unapproximated)   Closure None   Drainage Amount None   Drainage Description No odor   Wound Properties Date First Assessed: 12/03/15 Time First Assessed: 0951 Wound Type: Other (Comment) Location: Ankle Location Orientation: Right Wound Description (Comments): lateral aspect Present on Admission: Yes   Selective Debridement - Location lateral wound    Selective Debridement - Tools Used Forceps   Selective Debridement - Tissue Removed slough  Wound Therapy - Clinical Statement Pt tolerated well wtith compression dressings.  Session focus on selective debridement for removal of slough to promote healing.  Continued with xeroform, vaseline on skin perimeter and profore lite with extra foam to prevent dressings from sliding down LE.  Pt with antalgic gait mechanics at entrance, pt advised to begin ambulating with cane for safety to reduce risk of falls.  EOS pt reports pain free with improved weight bearing noted with gait.     Wound Therapy - Functional Problem List non-healing wound/recurring wound    Factors Delaying/Impairing Wound Healing Vascular compromise   Hydrotherapy Plan Debridement;Dressing change;Patient/family education   Wound Therapy -  Frequency --  2x/ wk   Wound Therapy - Current Recommendations PT   Wound Plan xeroform, compression wrap as appropriate   Dressing  xeroform with proform lite                    PT Short Term Goals - 03/23/16 1724      PT SHORT TERM GOAL #1   Title Patient to experience pain no more than 2/10 in order to improve QOL and tolerance to CKC activities    Time 2   Period Weeks   Status New     PT SHORT TERM GOAL #2   Title Wound area to have decreased by at least 50% in order to show general improvement of condition    Time 2   Period Weeks   Status New           PT Long Term Goals - 03/23/16 1726      PT LONG TERM GOAL #1   Title Wound to have completely resolved and healed completely  with pain 0/10 to show resolution of condition    Time 4   Period Weeks   Status New     PT LONG TERM GOAL #2   Title Patient to be able to verbally state the importance of consistently wearing appropriate grade compression stockings in order to assist in preventing further wound formation    Time 4   Period Weeks   Status New             Patient will benefit from skilled therapeutic intervention in order to improve the following deficits and impairments:     Visit Diagnosis: 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 Venous stasis ulcer ([place]) 03/15/2016  . Cellulitis 12/08/2015  . (HFpEF) heart failure with preserved ejection fraction ([place]) 04/16/2013  . Mitral regurgitation   . Anemia   . HTN (hypertension)   . Atrial fibrillation ([place]) 04/02/2009  . DIASTOLIC HEART FAILURE, CHRONIC 04/02/2009   Ihor Austin, [place]; [place]  Aldona Lento 03/25/2016, 12:59 PM  [place], [place],  [address] Phone: [phone]   Fax:  [phone]  Name: Jody [name] MRN: [id number] Date of Birth: 08/21/1927

## 2016-04-01 ENCOUNTER — Ambulatory Visit (HOSPITAL_COMMUNITY): Payer: Medicare Other | Admitting: Physical Therapy

## 2016-04-01 DIAGNOSIS — L97311 Non-pressure chronic ulcer of right ankle limited to breakdown of skin: Secondary | ICD-10-CM

## 2016-04-01 DIAGNOSIS — M79661 Pain in right lower leg: Secondary | ICD-10-CM | POA: Diagnosis not present

## 2016-04-01 DIAGNOSIS — R262 Difficulty in walking, not elsewhere classified: Secondary | ICD-10-CM

## 2016-04-01 NOTE — Therapy (Signed)
[place], [place], [address] Phone: [phone]   Fax:  [phone]  Wound Care Therapy  Patient Details  Name: Jody [name] MRN: [id number] Date of Birth: Aug 16, 1927 Referring Provider: Timmothy Euler   Encounter Date: 04/01/2016      PT End of Session - 04/01/16 1421    Visit Number 3   Number of Visits 8   Date for PT Re-Evaluation 04/20/16   Authorization Type UHC medicare    Authorization Time Period 03/23/16 to 04/23/16   Authorization - Visit Number 3   Authorization - Number of Visits 10   PT Start Time [id number]   PT Stop Time 1411   PT Time Calculation (min) 28 min   Activity Tolerance Patient tolerated treatment well   Behavior During Therapy [facility] for tasks assessed/performed      Past Medical History:  Diagnosis Date  . Afib ([place])   . Anemia   . CAD (coronary artery disease)   . Chronic diastolic heart failure ([place])   . HTN (hypertension)   . Mitral regurgitation    Moderate, echo, 07/2012  . Pulmonary hypertension     Past Surgical History:  Procedure Laterality Date  . JOINT REPLACEMENT Right Feb 2015   Dr. Case- Ledell Noss  . TOTAL KNEE ARTHROPLASTY Left     There were no vitals filed for this visit.                  Wound Therapy - 04/01/16 1418    Subjective Patient arrives today pain free, stating she is doing well    Patient and Family Stated Goals For the wound to heal    Date of Onset 10/22/15   Prior Treatments antibiotics, self-dressing with xeroform/gauze    Pain Assessment No/denies pain   Evaluation and Treatment Procedures Explained to Patient/Family Yes   Evaluation and Treatment Procedures agreed to   Wound Properties Date First Assessed: 03/23/16 Wound Type: Venous stasis ulcer Location: Leg Location Orientation: Right;Lower Wound Description (Comments): R lateral lower LE  Present on Admission: Yes   Dressing Type Impregnated gauze (bismuth);Gauze  (Comment)   Dressing Changed Changed   Dressing Status Clean;Dry   Dressing Change Frequency PRN   Site / Wound Assessment Clean;Dry   % Wound base Red or Granulating 100%   % Wound base Yellow/Fibrinous Exudate 0%   Margins Unattached edges (unapproximated)   Closure None   Drainage Amount None   Drainage Description No odor   Wound Properties Date First Assessed: 12/03/15 Time First Assessed: 0951 Wound Type: Other (Comment) Location: Ankle Location Orientation: Right Wound Description (Comments): lateral aspect Present on Admission: Yes   Selective Debridement - Location lateral wound    Selective Debridement - Tools Used Forceps   Selective Debridement - Tissue Removed dry skin surrounding wound    Wound Therapy - Clinical Statement Patient arrives pain free today and reports she is doing very well. Wound continues to look well after removal of profore lite/xeroform. Debrided dry skin surrounding wound today and continue to apply xeroform as well as profore lite this afternoon, using extra gauze and tape at the top of LE to help avoid profore slippage. Reminded patient to cut off wrap if her toes turn colors or she has pain in her toes.    Wound Therapy - Functional Problem List non-healing wound/recurring wound    Factors Delaying/Impairing Wound Healing Vascular compromise   Hydrotherapy Plan Debridement;Dressing change;Patient/family education  Wound Therapy - Frequency Other (comment)  2X/week    Wound Therapy - Current Recommendations PT   Wound Plan xeroform, compression wrap as appropriate   Dressing  xeroform with proform lite                 PT Education - 04/01/16 1421    Education provided Yes   Education Details cut off profore lite  if toes turn color or start hurting    Person(s) Educated Patient   Methods Explanation   Comprehension Verbalized understanding          PT Short Term Goals - 03/23/16 1724      PT SHORT TERM GOAL #1   Title Patient to  experience pain no more than 2/10 in order to improve QOL and tolerance to CKC activities    Time 2   Period Weeks   Status New     PT SHORT TERM GOAL #2   Title Wound area to have decreased by at least 50% in order to show general improvement of condition    Time 2   Period Weeks   Status New           PT Long Term Goals - 03/23/16 1726      PT LONG TERM GOAL #1   Title Wound to have completely resolved and healed completely  with pain 0/10 to show resolution of condition    Time 4   Period Weeks   Status New     PT LONG TERM GOAL #2   Title Patient to be able to verbally state the importance of consistently wearing appropriate grade compression stockings in order to assist in preventing further wound formation    Time 4   Period Weeks   Status New             Patient will benefit from skilled therapeutic intervention in order to improve the following deficits and impairments:     Visit Diagnosis: 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 Venous stasis ulcer ([place]) 03/15/2016  . Cellulitis 12/08/2015  . (HFpEF) heart failure with preserved ejection fraction ([place]) 04/16/2013  . Mitral regurgitation   . Anemia   . HTN (hypertension)   . Atrial fibrillation ([place]) 04/02/2009  . DIASTOLIC HEART FAILURE, CHRONIC 04/02/2009    Deniece Ree PT, DPT [place] [address] [place], [place], [address] Phone: [phone]   Fax:  [phone]  Name: Jody [name] MRN: [id number] Date of Birth: Feb 06, 1928

## 2016-04-06 ENCOUNTER — Ambulatory Visit (HOSPITAL_COMMUNITY): Payer: Medicare Other | Attending: Family

## 2016-04-06 DIAGNOSIS — L97311 Non-pressure chronic ulcer of right ankle limited to breakdown of skin: Secondary | ICD-10-CM | POA: Insufficient documentation

## 2016-04-06 DIAGNOSIS — M25571 Pain in right ankle and joints of right foot: Secondary | ICD-10-CM | POA: Diagnosis not present

## 2016-04-06 DIAGNOSIS — M79661 Pain in right lower leg: Secondary | ICD-10-CM | POA: Diagnosis not present

## 2016-04-06 DIAGNOSIS — I83013 Varicose veins of right lower extremity with ulcer of ankle: Secondary | ICD-10-CM | POA: Insufficient documentation

## 2016-04-06 DIAGNOSIS — L97319 Non-pressure chronic ulcer of right ankle with unspecified severity: Secondary | ICD-10-CM

## 2016-04-06 DIAGNOSIS — R262 Difficulty in walking, not elsewhere classified: Secondary | ICD-10-CM

## 2016-04-06 NOTE — Therapy (Signed)
[place], [place], [address] Phone: [phone]   Fax:  [phone]  Wound Care Therapy  Patient Details  Name: Jody [name] MRN: [id number] Date of Birth: [birth year] Referring Provider: Timmothy Euler   Encounter Date: 04/06/2016      PT End of Session - 04/06/16 1023    Visit Number 4   Number of Visits 8   Date for PT Re-Evaluation 04/20/16   Authorization Type UHC medicare    Authorization Time Period 03/23/16 to 04/23/16   Authorization - Visit Number 4   Authorization - Number of Visits 10   PT Start Time 0941   PT Stop Time 1015   PT Time Calculation (min) 34 min   Activity Tolerance Patient tolerated treatment well   Behavior During Therapy [facility] for tasks assessed/performed      Past Medical History:  Diagnosis Date  . Afib ([place])   . Anemia   . CAD (coronary artery disease)   . Chronic diastolic heart failure ([place])   . HTN (hypertension)   . Mitral regurgitation    Moderate, echo, 07/2012  . Pulmonary hypertension     Past Surgical History:  Procedure Laterality Date  . JOINT REPLACEMENT Right Feb 2015   Dr. Case- Ledell Noss  . TOTAL KNEE ARTHROPLASTY Left     There were no vitals filed for this visit.       Subjective Assessment - 04/06/16 1144    Subjective Pt arrived with dressings removed and increased swelling around knee, no reports of current pain.   Currently in Pain? No/denies                   Wound Therapy - 04/06/16 1145    Subjective Pt arrived with dressings removed and increased swelling around knee, no reports of current pain.   Patient and Family Stated Goals For the wound to heal    Date of Onset 10/22/15   Prior Treatments antibiotics, self-dressing with xeroform/gauze    Pain Assessment No/denies pain   Evaluation and Treatment Procedures Explained to Patient/Family Yes   Evaluation and Treatment Procedures agreed to   Wound Properties Date First  Assessed: 03/23/16 Wound Type: Venous stasis ulcer Location: Leg Location Orientation: Right;Lower Wound Description (Comments): R lateral lower LE  Present on Admission: Yes   Dressing Type Impregnated gauze (bismuth);Gauze (Comment)  vaseline, xeroform, profore with extra foam   Dressing Changed Changed   Dressing Status Clean;Dry   Dressing Change Frequency PRN   Site / Wound Assessment Clean;Dry   % Wound base Red or Granulating 100%   Margins Unattached edges (unapproximated)   Closure None   Drainage Amount None   Drainage Description No odor   Treatment Cleansed;Debridement (Selective)   Wound Properties Date First Assessed: 12/03/15 Time First Assessed: [id number] Wound Type: Other (Comment) Location: Ankle Location Orientation: Right Wound Description (Comments): lateral aspect Present on Admission: Yes   Selective Debridement - Location lateral wound    Selective Debridement - Tools Used Forceps   Selective Debridement - Tissue Removed dry skin surrounding wound    Wound Therapy - Clinical Statement Pt arrived with dressings removed stated they were tight and increased swelling around knee.  Pt educated on benefits of compression hose to assist with edema.  Selective debridement for removal of dry skin perimeter and small anount of slough in wound bed.  Continued with xeroform and profore dressings with extra foam for comfort and to reduce  dressings sliding down LE.  No reports of pain at EOS, did c/o stinging while cleaning wound.     Wound Therapy - Functional Problem List non-healing wound/recurring wound    Factors Delaying/Impairing Wound Healing Vascular compromise   Hydrotherapy Plan Debridement;Dressing change;Patient/family education   Wound Therapy - Frequency Other (comment)  2x/week   Wound Therapy - Current Recommendations PT   Wound Plan xeroform, compression wrap as appropriate   Dressing  xeroform with proform lite                   PT Short Term Goals -  03/23/16 1724      PT SHORT TERM GOAL #1   Title Patient to experience pain no more than 2/10 in order to improve QOL and tolerance to CKC activities    Time 2   Period Weeks   Status New     PT SHORT TERM GOAL #2   Title Wound area to have decreased by at least 50% in order to show general improvement of condition    Time 2   Period Weeks   Status New           PT Long Term Goals - 03/23/16 1726      PT LONG TERM GOAL #1   Title Wound to have completely resolved and healed completely  with pain 0/10 to show resolution of condition    Time 4   Period Weeks   Status New     PT LONG TERM GOAL #2   Title Patient to be able to verbally state the importance of consistently wearing appropriate grade compression stockings in order to assist in preventing further wound formation    Time 4   Period Weeks   Status New             Patient will benefit from skilled therapeutic intervention in order to improve the following deficits and impairments:     Visit Diagnosis: 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 Venous stasis ulcer ([place]) 03/15/2016  . Cellulitis 12/08/2015  . (HFpEF) heart failure with preserved ejection fraction ([place]) 04/16/2013  . Mitral regurgitation   . Anemia   . HTN (hypertension)   . Atrial fibrillation ([place]) 04/02/2009  . DIASTOLIC HEART FAILURE, CHRONIC 04/02/2009   Jody [name], [place]; [place]  Aldona Lento 04/06/2016, 11:51 AM  [place], [place], [address] Phone: [phone]   Fax:  [phone]  Name: Jody [name] MRN: [id number] Date of Birth:  Sep 23, 1927

## 2016-04-11 ENCOUNTER — Ambulatory Visit (HOSPITAL_COMMUNITY): Payer: Medicare Other | Admitting: Physical Therapy

## 2016-04-11 ENCOUNTER — Telehealth (HOSPITAL_COMMUNITY): Payer: Self-pay | Admitting: Family

## 2016-04-11 NOTE — Telephone Encounter (Signed)
04/11/16 daughter called to cx... She said that her mom didn't want to be out in the weather if it came this way

## 2016-04-15 ENCOUNTER — Ambulatory Visit (HOSPITAL_COMMUNITY): Payer: Medicare Other

## 2016-04-15 DIAGNOSIS — M25571 Pain in right ankle and joints of right foot: Secondary | ICD-10-CM | POA: Diagnosis not present

## 2016-04-15 DIAGNOSIS — L97311 Non-pressure chronic ulcer of right ankle limited to breakdown of skin: Secondary | ICD-10-CM | POA: Diagnosis not present

## 2016-04-15 DIAGNOSIS — I83013 Varicose veins of right lower extremity with ulcer of ankle: Secondary | ICD-10-CM

## 2016-04-15 DIAGNOSIS — M79661 Pain in right lower leg: Secondary | ICD-10-CM

## 2016-04-15 DIAGNOSIS — R262 Difficulty in walking, not elsewhere classified: Secondary | ICD-10-CM

## 2016-04-15 DIAGNOSIS — L97319 Non-pressure chronic ulcer of right ankle with unspecified severity: Secondary | ICD-10-CM

## 2016-04-15 NOTE — Therapy (Signed)
[place], [place], [address] Phone: [phone]   Fax:  [phone]  Wound Care Therapy  Patient Details  Name: Jody [name] MRN: [id number] Date of Birth: 06/01/1927 Referring Provider: Timmothy Euler   Encounter Date: 04/15/2016      PT End of Session - 04/15/16 1107    Visit Number 5   Number of Visits 5   Date for PT Re-Evaluation 04/20/16   Authorization Type UHC medicare    Authorization Time Period 03/23/16 to 04/23/16   Authorization - Visit Number 5   Authorization - Number of Visits 10   PT Start Time 7564   PT Stop Time 1055   PT Time Calculation (min) 20 min   Activity Tolerance Patient tolerated treatment well   Behavior During Therapy [facility] for tasks assessed/performed      Past Medical History:  Diagnosis Date  . Afib ([place])   . Anemia   . CAD (coronary artery disease)   . Chronic diastolic heart failure ([place])   . HTN (hypertension)   . Mitral regurgitation    Moderate, echo, 07/2012  . Pulmonary hypertension     Past Surgical History:  Procedure Laterality Date  . JOINT REPLACEMENT Right Feb 2015   Dr. Case- Ledell Noss  . TOTAL KNEE ARTHROPLASTY Left     There were no vitals filed for this visit.       Subjective Assessment - 04/15/16 1058    Subjective Pt arrived with dressing intact, reports no pain and increased ease with weight bearing during gait   Currently in Pain? No/denies                   Wound Therapy - 04/15/16 1059    Subjective Pt arrived with dressing intact, reports no pain and increased ease with weight bearing during gait   Patient and Family Stated Goals For the wound to heal    Date of Onset 10/22/15   Prior Treatments antibiotics, self-dressing with xeroform/gauze    Pain Assessment No/denies pain   Evaluation and Treatment Procedures Explained to Patient/Family Yes   Evaluation and Treatment Procedures agreed to   Wound Properties Date  First Assessed: 03/23/16 Wound Type: Venous stasis ulcer Location: Leg Location Orientation: Right;Lower Wound Description (Comments): R lateral lower LE  Present on Admission: Yes   Dressing Type Impregnated gauze (bismuth)  xeroform and bandaid   Dressing Changed Changed   Dressing Status Clean;Dry;Intact   Dressing Change Frequency PRN   Site / Wound Assessment Clean;Dry   % Wound base Red or Granulating 100%   % Wound base Yellow/Fibrinous Exudate 0%   Wound Length (cm) 0 cm   Wound Width (cm) 0 cm   Margins Attached edges (approximated)   Closure None   Drainage Amount None   Drainage Description No odor   Treatment Cleansed   Wound Properties Date First Assessed: 12/03/15 Time First Assessed: 0951 Wound Type: Other (Comment) Location: Ankle Location Orientation: Right Wound Description (Comments): lateral aspect Present on Admission: Yes   Dressing Changed Changed   Selective Debridement - Location lateral wound    Selective Debridement - Tools Used Other (comment)  cleansed, no debridement necessary   Selective Debridement - Tissue Removed dry skin surrounding wound    Wound Therapy - Clinical Statement Pt arrived with dressings intact and no reports of pain.  Upon removal of dressings noted decreased edema, upon removal of dry skin noted 100% granulation tissue.  No selective debridement necessary this session.  Pt educated on proper care and encouraged to apply compression hose when home for edema control.  Reviewed signs to look for.  No questions.     Wound Therapy - Functional Problem List non-healing wound/recurring wound    Factors Delaying/Impairing Wound Healing Vascular compromise   Hydrotherapy Plan Debridement;Dressing change;Patient/family education   Wound Therapy - Frequency Other (comment)  D/C due to no skilled intervention required   Wound Therapy - Current Recommendations PT   Wound Plan D/C to HEP   Dressing  xeroform and bandaid; encouraged to apply  compression hose once home.                     PT Short Term Goals - 03/23/16 1724      PT SHORT TERM GOAL #1   Title Patient to experience pain no more than 2/10 in order to improve QOL and tolerance to CKC activities    Time 2   Period Weeks   Status achieved     PT SHORT TERM GOAL #2   Title Wound area to have decreased by at least 50% in order to show general improvement of condition    Time 2   Period Weeks   Status achieved           PT Long Term Goals - 03/23/16 1726      PT LONG TERM GOAL #1   Title Wound to have completely resolved and healed completely  with pain 0/10 to show resolution of condition    Time 4   Period Weeks   Status achieved     PT LONG TERM GOAL #2   Title Patient to be able to verbally state the importance of consistently wearing appropriate grade compression stockings in order to assist in preventing further wound formation    Time 4   Period Weeks   Status achieved             Patient will benefit from skilled therapeutic intervention in order to improve the following deficits and impairments:     Visit Diagnosis: 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)  Pain in right lower leg  Difficulty in walking, not elsewhere classified  Pain in right ankle and joints of right foot  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 Venous stasis ulcer ([place]) 03/15/2016  . Cellulitis 12/08/2015  . (HFpEF) heart failure  with preserved ejection fraction ([place]) 04/16/2013  . Mitral regurgitation   . Anemia   . HTN (hypertension)   . Atrial fibrillation ([place]) 04/02/2009  . DIASTOLIC HEART FAILURE, CHRONIC 04/02/2009   Ihor Austin, [place]; [place]  Rayetta Humphrey, PT CLT [phone] 2016/05/01, 12:21 PM  [place] [address] [place], [place], [address] Phone: [phone]   Fax:  [phone]  Name: Jody [name] MRN: [id number]  Date of Birth: [birth year]    PHYSICAL THERAPY DISCHARGE SUMMARY  Visits from Start of Care: 5  Current functional level related to goals / functional outcomes: Wound healed   Remaining deficits: none   Education / Equipment: none Plan: Patient agrees to discharge.  Patient goals were met. Patient is being discharged due to meeting the stated rehab goals.  ?????        Rayetta Humphrey, [place] CLT [phone]

## 2016-04-20 ENCOUNTER — Ambulatory Visit (HOSPITAL_COMMUNITY): Payer: Medicare Other | Admitting: Physical Therapy

## 2016-05-04 DIAGNOSIS — L02415 Cutaneous abscess of right lower limb: Secondary | ICD-10-CM | POA: Diagnosis not present

## 2016-05-06 DIAGNOSIS — J018 Other acute sinusitis: Secondary | ICD-10-CM | POA: Diagnosis not present

## 2016-05-06 DIAGNOSIS — R6889 Other general symptoms and signs: Secondary | ICD-10-CM | POA: Diagnosis not present

## 2016-05-18 DIAGNOSIS — L02415 Cutaneous abscess of right lower limb: Secondary | ICD-10-CM | POA: Diagnosis not present

## 2016-06-07 ENCOUNTER — Ambulatory Visit: Payer: Medicare Other | Admitting: Internal Medicine

## 2016-06-10 ENCOUNTER — Encounter (INDEPENDENT_AMBULATORY_CARE_PROVIDER_SITE_OTHER): Payer: Self-pay

## 2016-06-10 ENCOUNTER — Encounter: Payer: Self-pay | Admitting: Internal Medicine

## 2016-06-10 ENCOUNTER — Ambulatory Visit (INDEPENDENT_AMBULATORY_CARE_PROVIDER_SITE_OTHER): Payer: Medicare Other | Admitting: Internal Medicine

## 2016-06-10 VITALS — BP 142/90 | HR 94 | Ht 62.0 in | Wt 113.6 lb

## 2016-06-10 DIAGNOSIS — I10 Essential (primary) hypertension: Secondary | ICD-10-CM

## 2016-06-10 DIAGNOSIS — I480 Paroxysmal atrial fibrillation: Secondary | ICD-10-CM

## 2016-06-10 DIAGNOSIS — I447 Left bundle-branch block, unspecified: Secondary | ICD-10-CM | POA: Diagnosis not present

## 2016-06-10 MED ORDER — FUROSEMIDE 20 MG PO TABS: 20.0000 mg | ORAL_TABLET | Freq: Every day | ORAL | 3 refills | Status: DC | PRN

## 2016-06-10 MED ORDER — DILTIAZEM HCL ER COATED BEADS 240 MG PO CP24: 240.0000 mg | ORAL_CAPSULE | Freq: Every day | ORAL | 3 refills | Status: DC

## 2016-06-10 MED ORDER — APIXABAN 2.5 MG PO TABS: 2.5000 mg | ORAL_TABLET | Freq: Two times a day (BID) | ORAL | 3 refills | Status: DC

## 2016-06-10 NOTE — Progress Notes (Signed)
.  k[facility] Patient Care Team: Sharion Balloon, FNP as PCP - General (Family Medicine)   HPI  Jody [name] is a 83 y.o. female  is seen in followup for atrial fibrillation with a prior problem with rapid ventricular response. She is currently treated with diltiazem for rate control.  SHE takes  apixoban    She underwent In 2014 she had a nuclear scanning demonstrating normal perfusion and normal left ventricular function. An echocardiogram 2014 also demonstrated normal left ventricular function withmoderate LVH MR and PHTN. Mild aortic stenosis was identified.     She takes her meds regularly x diuretic which she takes as needed  The patient denies chest pain, shortness of breath, nocturnal dyspnea, orthopnea or peripheral edema.  There have been no palpitations, lightheadedness or syncope.    She had a knee replacement in May. She is still working on rehabilitation she is back at work full-time  Past Medical History:  Diagnosis Date  . Afib ([place])   . Anemia   . CAD (coronary artery disease)   . Chronic diastolic heart failure ([place])   . HTN (hypertension)   . Mitral regurgitation    Moderate, echo, 07/2012  . Pulmonary hypertension     Past Surgical History:  Procedure Laterality Date  . JOINT REPLACEMENT Right Feb 2015   Dr. Case- [facility] Noss  . TOTAL KNEE ARTHROPLASTY Left     Current Outpatient Prescriptions  Medication Sig Dispense Refill  . diltiazem (CARDIZEM CD) 240 MG 24 hr capsule TAKE ONE CAPSULE BY MOUTH ONCE DAILY 90 capsule 3  . ELIQUIS 2.5 MG TABS tablet TAKE ONE TABLET BY MOUTH TWICE DAILY 180 tablet 3  . furosemide (LASIX) 20 MG tablet Take 1 tablet (20 mg total) by mouth daily as needed for edema. Pt is taking one pill by mouth every two weeks. 10 tablet 3  . IRON, FERROUS GLUCONATE, PO Take 27 mg by mouth.    . mupirocin ointment (BACTROBAN) 2 % Place 1 application into the nose 2 (two) times daily. 22 g 0  . vitamin B-12 (CYANOCOBALAMIN) 1000 MCG tablet Take 1,000  mcg by mouth daily.     No current facility-administered medications for this visit.     Allergies  Allergen Reactions  . Levaquin [Levofloxacin] Other (See Comments)    Patient stated vision became blurry and became weak.    Review of Systems negative except from HPI and PMH  Physical Exam BP (!) 142/90   Pulse 94   Ht 5\' 2"  (1.575 m)   Wt 113 lb 9.6 oz (51.5 kg)   SpO2 96%   BMI 20.78 kg/m  Well developed and nourished in no acute distress HENT normal Neck supple with JVP-flat Carotids brisk and full without bruits Clear Irregularly irregular rate and rhythm with controlled ventricular response, 2/6 systolic murmur that varies  RR intervalAbd-soft with active BS without hepatomegaly No Clubbing cyanosis trace edema Skin-warm and dry A & Oriented  Grossly normal sensory and motor function   ECG  Atrial fib at 83 -/15/40  Assessment and  Plan  Atrial fibrillation-permanent  Hypertension   LBBB  Blood pressure is reasonably controlled. She has no significant symptoms.  We will continue her current medications. She has no bleeding issues of which she is aware  Will check CBC and Renal function   Still working full time

## 2016-06-10 NOTE — Patient Instructions (Signed)
Medication Instructions: - Your physician recommends that you continue on your current medications as directed. Please refer to the Current Medication list given to you today.  Labwork: - Your physician recommends that you have lab work today: BMP/CBC  Procedures/Testing: - none ordered  Follow-Up: - Your physician wants you to follow-up in: 1 year with Dr. Klein. You will receive a reminder letter in the mail two months in advance. If you don't receive a letter, please call our office to schedule the follow-up appointment.  Any Additional Special Instructions Will Be Listed Below (If Applicable).     If you need a refill on your cardiac medications before your next appointment, please call your pharmacy.   

## 2016-06-12 LAB — BASIC METABOLIC PANEL
BUN/Creatinine Ratio: 28 (ref 12–28)
BUN: 26 mg/dL (ref 8–27)
CALCIUM: 9.7 mg/dL (ref 8.7–10.3)
CO2: 28 mmol/L (ref 18–29)
CREATININE: 0.94 mg/dL (ref 0.57–1.00)
Chloride: 99 mmol/L (ref 96–106)
GFR calc Af Amer: 63 mL/min/{1.73_m2} (ref >59)
GFR, EST NON AFRICAN AMERICAN: 55 mL/min/{1.73_m2} — AB (ref >59)
Glucose: 90 mg/dL (ref 65–99)
Potassium: 5.3 mmol/L — ABNORMAL HIGH (ref 3.5–5.2)
Sodium: 141 mmol/L (ref 134–144)

## 2016-06-12 LAB — CBC WITH DIFFERENTIAL/PLATELET
Basophils Absolute: 0 10*3/uL (ref 0.0–0.2)
Basos: 0 %
EOS (ABSOLUTE): 0.1 10*3/uL (ref 0.0–0.4)
Eos: 2 %
Hematocrit: 42.1 % (ref 34.0–46.6)
Hemoglobin: 14 g/dL (ref 11.1–15.9)
Immature Grans (Abs): 0 10*3/uL (ref 0.0–0.1)
Immature Granulocytes: 0 %
Lymphocytes Absolute: 1.4 10*3/uL (ref 0.7–3.1)
Lymphs: 30 %
MCH: 31 pg (ref 26.6–33.0)
MCHC: 33.3 g/dL (ref 31.5–35.7)
MCV: 93 fL (ref 79–97)
Monocytes Absolute: 0.5 10*3/uL (ref 0.1–0.9)
Monocytes: 10 %
Neutrophils Absolute: 2.6 10*3/uL (ref 1.4–7.0)
Neutrophils: 58 %
Platelets: 270 10*3/uL (ref 150–379)
RBC: 4.51 x10E6/uL (ref 3.77–5.28)
RDW: 14.8 % (ref 12.3–15.4)
WBC: 4.6 10*3/uL (ref 3.4–10.8)

## 2016-06-14 ENCOUNTER — Telehealth: Payer: Self-pay

## 2016-06-14 NOTE — Telephone Encounter (Signed)
Pateint has a voicemail that is not set up to receive messages. Will attempt to call patient later

## 2016-06-14 NOTE — Telephone Encounter (Signed)
Left message with patients daughter to have patient call the office to go over results and arrange lab appointment for recheck. Daughter informed me that the patient was at work and wouldn't be available to come to the phone. She informed me that she would tell her mother to call the office. We were both agreeable.

## 2016-07-05 ENCOUNTER — Ambulatory Visit (INDEPENDENT_AMBULATORY_CARE_PROVIDER_SITE_OTHER): Payer: Medicare Other | Admitting: Family

## 2016-07-05 ENCOUNTER — Encounter: Payer: Self-pay | Admitting: Family

## 2016-07-05 VITALS — BP 152/90 | HR 53 | Temp 96.9°F | Ht 62.0 in | Wt 112.0 lb

## 2016-07-05 DIAGNOSIS — I739 Peripheral vascular disease, unspecified: Secondary | ICD-10-CM

## 2016-07-05 DIAGNOSIS — I83018 Varicose veins of right lower extremity with ulcer other part of lower leg: Secondary | ICD-10-CM

## 2016-07-05 DIAGNOSIS — L97811 Non-pressure chronic ulcer of other part of right lower leg limited to breakdown of skin: Secondary | ICD-10-CM | POA: Diagnosis not present

## 2016-07-05 MED ORDER — SULFAMETHOXAZOLE-TRIMETHOPRIM 800-160 MG PO TABS: 1.0000 | ORAL_TABLET | Freq: Two times a day (BID) | ORAL | 0 refills | Status: DC

## 2016-07-05 MED ORDER — TRAMADOL HCL 50 MG PO TABS: 50.0000 mg | ORAL_TABLET | Freq: Every evening | ORAL | 5 refills | Status: DC | PRN

## 2016-07-05 NOTE — Progress Notes (Signed)
   Subjective:    Patient ID: Jody [name], female    DOB: 11-23-1927, 83 y.o.   MRN: [id number]  HPI PT presents to the office today with recurrent right leg wound. PT had cellulitis and venous ulcer last year on her lower right leg and was followed by wound care. Pt states she is nervous that it will get to where it was. PT states she has had this wound for months and it will heal and then come back in less than month.   Pt reports a reddish discharge and having constant pain of 10 out 10 at night.    Review of Systems  Skin: Positive for wound.  All other systems reviewed and are negative.      Objective:   Physical Exam  Constitutional: She is oriented to person, place, and time. She appears well-developed and well-nourished. No distress.  HENT:  Head: Normocephalic.  Eyes: Pupils are equal, round, and reactive to light.  Neck: Normal range of motion. Neck supple. No thyromegaly present.  Cardiovascular: Normal rate, regular rhythm, normal heart sounds and intact distal pulses.   No murmur heard. Pulmonary/Chest: Effort normal and breath sounds normal. No respiratory distress. She has no wheezes.  Abdominal: Soft. Bowel sounds are normal. She exhibits no distension. There is no tenderness.  Musculoskeletal: Normal range of motion. She exhibits no edema or tenderness.  Neurological: She is alert and oriented to person, place, and time.  Skin: Skin is warm and dry.  Ulcer on 1.7X0.9 cm on right lateral leg with erythemas border  Psychiatric: She has a normal mood and affect. Her behavior is normal. Judgment and thought content normal.  Vitals reviewed.  Area cleaned and Vaseline gauzed applied .  BP (!) 154/85   Pulse 78   Temp (!) 96.9 F (36.1 C) (Oral)   Ht 5\' 2"  (1.575 m)   Wt 112 lb (50.8 kg)   BMI 20.49 kg/m      Assessment & Plan:  1. Venous stasis ulcer of other part of right lower leg limited to breakdown of skin, unspecified whether varicose veins present  ([place]) - Ambulatory referral to Vascular Surgery - traMADol (ULTRAM) 50 MG tablet; Take 1 tablet (50 mg total) by mouth at bedtime as needed.  Dispense: 30 tablet; Refill: 5 - sulfamethoxazole-trimethoprim (BACTRIM DS) 800-160 MG tablet; Take 1 tablet by mouth 2 (two) times daily.  Dispense: 14 tablet; Refill: 0  2. PVD (peripheral vascular disease) ([place]) - Ambulatory referral to Vascular Surgery - traMADol (ULTRAM) 50 MG tablet; Take 1 tablet (50 mg total) by mouth at bedtime as needed.  Dispense: 30 tablet; Refill: 5  Rest Keep elevated Follow up with Vascular  Pt refuses to go to Wound Care at this time Continue to keep clean and dry and dressing BID at home RTO prn  Evelina Dun, FNP

## 2016-07-05 NOTE — Patient Instructions (Signed)
Peripheral Vascular Disease Peripheral vascular disease (PVD) is a disease of the blood vessels that are not part of your heart and brain. A simple term for PVD is poor circulation. In most cases, PVD narrows the blood vessels that carry blood from your heart to the rest of your body. This can result in a decreased supply of blood to your arms, legs, and internal organs, like your stomach or kidneys. However, it most often affects a person's lower legs and feet. There are two types of PVD.  Organic PVD. This is the more common type. It is caused by damage to the structure of blood vessels.  Functional PVD. This is caused by conditions that make blood vessels contract and tighten (spasm).  Without treatment, PVD tends to get worse over time. PVD can also lead to acute ischemic limb. This is when an arm or limb suddenly has trouble getting enough blood. This is a medical emergency. What are the causes? Each type of PVD has many different causes. The most common cause of PVD is buildup of a fatty material (plaque) inside of your arteries (atherosclerosis). Small amounts of plaque can break off from the walls of the blood vessels and become lodged in a smaller artery. This blocks blood flow and can cause acute ischemic limb. Other common causes of PVD include:  Blood clots that form inside of blood vessels.  Injuries to blood vessels.  Diseases that cause inflammation of blood vessels or cause blood vessel spasms.  Health behaviors and health history that increase your risk of developing PVD.  What increases the risk? You may have a greater risk of PVD if you:  Have a family history of PVD.  Have certain medical conditions, including: ? High cholesterol. ? Diabetes. ? High blood pressure (hypertension). ? Coronary heart disease. ? Past problems with blood clots. ? Past injury, such as burns or a broken bone. These may have damaged blood vessels in your limbs. ? Buerger disease. This is  caused by inflamed blood vessels in your hands and feet. ? Some forms of arthritis. ? Rare birth defects that affect the arteries in your legs.  Use tobacco.  Do not get enough exercise.  Are obese.  Are age 50 or older.  What are the signs or symptoms? PVD may cause many different symptoms. Your symptoms depend on what part of your body is not getting enough blood. Some common signs and symptoms include:  Cramps in your lower legs. This may be a symptom of poor leg circulation (claudication).  Pain and weakness in your legs while you are physically active that goes away when you rest (intermittent claudication).  Leg pain when at rest.  Leg numbness, tingling, or weakness.  Coldness in a leg or foot, especially when compared with the other leg.  Skin or hair changes. These can include: ? Hair loss. ? Shiny skin. ? Pale or bluish skin. ? Thick toenails.  Inability to get or maintain an erection (erectile dysfunction).  People with PVD are more prone to developing ulcers and sores on their toes, feet, or legs. These may take longer than normal to heal. How is this diagnosed? Your health care provider may diagnose PVD from your signs and symptoms. The health care provider will also do a physical exam. You may have tests to find out what is causing your PVD and determine its severity. Tests may include:  Blood pressure recordings from your arms and legs and measurements of the strength of your pulses (  pulse volume recordings).  Imaging studies using sound waves to take pictures of the blood flow through your blood vessels (Doppler ultrasound).  Injecting a dye into your blood vessels before having imaging studies using: ? X-rays (angiogram or arteriogram). ? Computer-generated X-rays (CT angiogram). ? A powerful electromagnetic field and a computer (magnetic resonance angiogram or MRA).  How is this treated? Treatment for PVD depends on the cause of your condition and the  severity of your symptoms. It also depends on your age. Underlying causes need to be treated and controlled. These include long-lasting (chronic) conditions, such as diabetes, high cholesterol, and high blood pressure. You may need to first try making lifestyle changes and taking medicines. Surgery may be needed if these do not work. Lifestyle changes may include:  Quitting smoking.  Exercising regularly.  Following a low-fat, low-cholesterol diet.  Medicines may include:  Blood thinners to prevent blood clots.  Medicines to improve blood flow.  Medicines to improve your blood cholesterol levels.  Surgical procedures may include:  A procedure that uses an inflated balloon to open a blocked artery and improve blood flow (angioplasty).  A procedure to put in a tube (stent) to keep a blocked artery open (stent implant).  Surgery to reroute blood flow around a blocked artery (peripheral bypass surgery).  Surgery to remove dead tissue from an infected wound on the affected limb.  Amputation. This is surgical removal of the affected limb. This may be necessary in cases of acute ischemic limb that are not improved through medical or surgical treatments.  Follow these instructions at home:  Take medicines only as directed by your health care provider.  Do not use any tobacco products, including cigarettes, chewing tobacco, or electronic cigarettes. If you need help quitting, ask your health care provider.  Lose weight if you are overweight, and maintain a healthy weight as directed by your health care provider.  Eat a diet that is low in fat and cholesterol. If you need help, ask your health care provider.  Exercise regularly. Ask your health care provider to suggest some good activities for you.  Use compression stockings or other mechanical devices as directed by your health care provider.  Take good care of your feet. ? Wear comfortable shoes that fit well. ? Check your feet  often for any cuts or sores. Contact a health care provider if:  You have cramps in your legs while walking.  You have leg pain when you are at rest.  You have coldness in a leg or foot.  Your skin changes.  You have erectile dysfunction.  You have cuts or sores on your feet that are not healing. Get help right away if:  Your arm or leg turns cold and blue.  Your arms or legs become red, warm, swollen, painful, or numb.  You have chest pain or trouble breathing.  You suddenly have weakness in your face, arm, or leg.  You become very confused or lose the ability to speak.  You suddenly have a very bad headache or lose your vision. This information is not intended to replace advice given to you by your health care provider. Make sure you discuss any questions you have with your health care provider. Document Released: 04/28/2004 Document Revised: 08/27/2015 Document Reviewed: 08/29/2013 Elsevier Interactive Patient Education  2017 [employer]  

## 2016-07-06 ENCOUNTER — Other Ambulatory Visit: Payer: Self-pay

## 2016-07-06 DIAGNOSIS — L97829 Non-pressure chronic ulcer of other part of left lower leg with unspecified severity: Secondary | ICD-10-CM

## 2016-07-06 DIAGNOSIS — I739 Peripheral vascular disease, unspecified: Secondary | ICD-10-CM

## 2016-07-06 DIAGNOSIS — I83028 Varicose veins of left lower extremity with ulcer other part of lower leg: Secondary | ICD-10-CM

## 2016-07-08 DIAGNOSIS — M7632 Iliotibial band syndrome, left leg: Secondary | ICD-10-CM | POA: Diagnosis not present

## 2016-07-13 DIAGNOSIS — Z0000 Encounter for general adult medical examination without abnormal findings: Secondary | ICD-10-CM | POA: Diagnosis not present

## 2016-07-13 DIAGNOSIS — I10 Essential (primary) hypertension: Secondary | ICD-10-CM | POA: Diagnosis not present

## 2016-07-28 DIAGNOSIS — M25552 Pain in left hip: Secondary | ICD-10-CM | POA: Diagnosis not present

## 2016-07-28 DIAGNOSIS — M1612 Unilateral primary osteoarthritis, left hip: Secondary | ICD-10-CM | POA: Diagnosis not present

## 2016-08-01 ENCOUNTER — Ambulatory Visit (INDEPENDENT_AMBULATORY_CARE_PROVIDER_SITE_OTHER): Payer: Medicare Other | Admitting: Family

## 2016-08-01 ENCOUNTER — Ambulatory Visit (INDEPENDENT_AMBULATORY_CARE_PROVIDER_SITE_OTHER): Payer: Medicare Other

## 2016-08-01 ENCOUNTER — Encounter: Payer: Self-pay | Admitting: Family

## 2016-08-01 VITALS — BP 138/84 | HR 85 | Temp 96.7°F | Ht 62.0 in | Wt 113.6 lb

## 2016-08-01 DIAGNOSIS — I739 Peripheral vascular disease, unspecified: Secondary | ICD-10-CM | POA: Diagnosis not present

## 2016-08-01 DIAGNOSIS — L97319 Non-pressure chronic ulcer of right ankle with unspecified severity: Secondary | ICD-10-CM

## 2016-08-01 MED ORDER — CEPHALEXIN 500 MG PO CAPS: 500.0000 mg | ORAL_CAPSULE | Freq: Three times a day (TID) | ORAL | 0 refills | Status: DC

## 2016-08-01 NOTE — Progress Notes (Signed)
   Subjective:    Patient ID: Jody [name], female    DOB: April 27, 1927, 82 y.o.   MRN: [id number]  HPI Pt presents to the office today with recurrent venous stasis ulcer.Pt has PVD and has an appt with Vascular on May 23. Pt has seen wound clinic in the past, but is not currently. PT reports this ulcer appeared over a year ago and comes and goes. PT reports it can heal for a few days, but it "always comes back". She reports constant pain of 10 out 10 that is worse at night. Pt wearing compression hose. She reports a bloody clear discharge.    Review of Systems  Skin: Positive for wound.  All other systems reviewed and are negative.      Objective:   Physical Exam  Constitutional: She is oriented to person, place, and time. She appears well-developed and well-nourished.  Cardiovascular: Normal rate and regular rhythm.   Murmur heard. Pulmonary/Chest: Effort normal and breath sounds normal.  Musculoskeletal: Normal range of motion. She exhibits tenderness (right ankle).  Neurological: She is alert and oriented to person, place, and time.  Skin: Skin is warm and dry.  2cmX2.5 cm right lateral ankle ulcer.   Psychiatric: She has a normal mood and affect. Her behavior is normal. Judgment and thought content normal.   Right ankle- No infection present   BP 138/84   Pulse 85   Temp (!) 96.7 F (35.9 C) (Oral)   Ht 5\' 2"  (1.575 m)   Wt 113 lb 9.6 oz (51.5 kg)   BMI 20.78 kg/m      Assessment & Plan:  1. PVD (peripheral vascular disease) ([facility]) - DG Ankle Complete Right; Future - AMB referral to wound care center - cephALEXin (KEFLEX) 500 MG capsule; Take 1 capsule (500 mg total) by mouth 3 (three) times daily.  Dispense: 20 capsule; Refill: 0  2. Lower limb ulcer, ankle, right, with unspecified severity ([place]) - DG Ankle Complete Right; Future - AMB referral to wound care center - cephALEXin (KEFLEX) 500 MG capsule; Take 1 capsule (500 mg total) by mouth 3 (three) times daily.   Dispense: 20 capsule; Refill: 0  3. Chronic ulcer of right ankle, unspecified ulcer stage ([facility]) - cephALEXin (KEFLEX) 500 MG capsule; Take 1 capsule (500 mg total) by mouth 3 (three) times daily.  Dispense: 20 capsule; Refill: 0  Keep Vascular appt Referral to Wound Care Keep clean and dry Continue wearing compression hose  RTO prn    Evelina Dun, FNP

## 2016-08-01 NOTE — Patient Instructions (Signed)
Venous Ulcer A venous ulcer is a shallow sore on your lower leg that is caused by poor circulation in your veins. This condition used to be called stasis ulcer. Veins have valves that help return blood to the heart. If these valves do not work properly, it can cause blood to flow backward and to back up into the veins near the skin. When that happens, blood can pool in your lower legs. The blood can then leak out of your veins, which can irritate your skin. This may cause a break in your skin that becomes a venous ulcer. Venous ulcer is the most common type of lower leg ulcer. You may have venous ulcers on one leg or on both legs. The area where this condition most commonly develops is around the ankles. A venous ulcer may last for a long time (chronic ulcer) or it may return repeatedly (recurrent ulcer). What are the causes? Any condition that causes poor circulation to your legs can lead to a venous ulcer. What increases the risk? This condition is more likely to develop in:  People who are 42 years of age or older.  People who are overweight.  People who are not active.  People who have had a leg ulcer in the past.  People who have clots in their lower leg veins (deep vein thrombosis).  People who have inflammation of their leg veins (phlebitis).  Women who have given birth.  People who smoke. What are the signs or symptoms? The most common symptom of this condition is an open sore near your ankle. Other symptoms may include:  Swelling.  Thickening of the skin.  Fluid leaking from the ulcer.  Bleeding.  Itching.  Pain and swelling that gets worse when you stand up and feels better when you raise your leg.  Blotchy skin.  Darkening of the skin. How is this diagnosed? Your health care provider may suspect a venous ulcer based on your medical history and your risk factors. Your health care provider will check the skin on your legs. Other tests may be done to learn more  about the ulcer and to determine the best way to treat it. Tests that may be done include:  Measuring the blood pressure in your arms and legs.  Using sound waves (ultrasound) to measure the blood flow in your leg veins. How is this treated? You may need to try several different types of treatment to get your venous ulcer to heal. Healing may take a long time. Treatment may include:  Keeping your leg raised (elevated).  Wearing a type of bandage or stocking to compress the veins of your leg (compression therapy). Venous wounds are not likely to heal or to stay healed without compression.  Taking medicines to improve blood flow.  Taking antibiotic medicines to treat infection.  Cleaning your ulcer and removing any dead tissue from the wound (debridement).  Placing various types of medicated bandage (dressings) or medicated wraps on your ulcer. This helps the ulcer to heal and helps to prevent infection. Surgery is sometimes needed to close the wound using a piece of skin taken from another area of your body (graft). You may need surgery if other treatments are not working or if your ulcer is very deep. Follow these instructions at home: Wound care   Follow instructions from your health care provider about:  How to take care of your wound.  When and how you should change your bandage (dressing).  When you should remove your dressing.  If your dressing is dry and sticks to your leg when you try to remove it, moisten or wet the dressing with saline solution or water so that the dressing can be removed without harming your skin or wound tissue.  Check your wound every day for signs of infection. Have a caregiver do this for you if you are not able to do it yourself. Check for:  More redness, swelling, or pain.  More fluid or blood.  Pus, warmth, or a bad smell. Medicines   Take over-the-counter and prescription medicines only as told by your health care provider.  If you were  prescribed an antibiotic medicine, take it or apply it as told by your health care provider. Do not stop taking or using the antibiotic even if your condition improves. Activity   Do not stand or sit in one position for a long period of time. Rest with your legs raised during the day. If possible, keep your legs above your heart for 30 minutes, 3-4 times a day, or as told by your health care provider.  Do not sit with your legs crossed.  Walk often to increase the blood flow in your legs.Ask your health care provider what level of activity is safe for you.  If you are taking a long ride in a car or plane, take a break to walk around at least once every two hours, or as often as your health care provider recommends. Ask your health care provider if you should take aspirin before long trips. General instructions    Wear elastic stockings, compression stockings, or support hose as told by your health care provider. This is very important.  Raise the foot of your bed as told by your health care provider.  Do not smoke.  Keep all follow-up visits as told by your health care provider. This is important. Contact a health care provider if:  You have a fever.  Your ulcer is getting larger or is not healing.  Your pain gets worse.  You have more redness or swelling around your ulcer.  You have more fluid, blood, or pus coming from your ulcer after it has been cleaned by you or your health care provider.  You have warmth or a bad smell coming from your ulcer. This information is not intended to replace advice given to you by your health care provider. Make sure you discuss any questions you have with your health care provider. Document Released: 12/14/2000 Document Revised: 08/27/2015 Document Reviewed: 07/30/2014 Elsevier Interactive Patient Education  2017 [employer].

## 2016-08-02 ENCOUNTER — Telehealth (HOSPITAL_COMMUNITY): Payer: Self-pay | Admitting: Family

## 2016-08-02 ENCOUNTER — Ambulatory Visit (HOSPITAL_COMMUNITY): Payer: Medicare Other | Attending: Family | Admitting: Physical Therapy

## 2016-08-02 DIAGNOSIS — R262 Difficulty in walking, not elsewhere classified: Secondary | ICD-10-CM | POA: Insufficient documentation

## 2016-08-02 DIAGNOSIS — L97311 Non-pressure chronic ulcer of right ankle limited to breakdown of skin: Secondary | ICD-10-CM | POA: Diagnosis not present

## 2016-08-02 DIAGNOSIS — I83013 Varicose veins of right lower extremity with ulcer of ankle: Secondary | ICD-10-CM | POA: Insufficient documentation

## 2016-08-02 DIAGNOSIS — L97319 Non-pressure chronic ulcer of right ankle with unspecified severity: Secondary | ICD-10-CM | POA: Diagnosis not present

## 2016-08-02 DIAGNOSIS — M79661 Pain in right lower leg: Secondary | ICD-10-CM | POA: Diagnosis not present

## 2016-08-02 DIAGNOSIS — M25571 Pain in right ankle and joints of right foot: Secondary | ICD-10-CM | POA: Diagnosis not present

## 2016-08-02 NOTE — Therapy (Addendum)
[place], [place], [address] Phone: [phone]   Fax:  [phone]  Wound Care Evaluation  Patient Details  Name: Jody [name] MRN: [id number] Date of Birth: 07-18-1927 Referring Provider: Glory Buff   Encounter Date: 08/02/2016      PT End of Session - 08/02/16 1214    Visit Number 1   Number of Visits 8   Date for PT Re-Evaluation 09/01/16   Authorization Type [employer] medicare   Authorization - Visit Number 1   Authorization - Number of Visits 8   PT Start Time 1115   PT Stop Time 1145   PT Time Calculation (min) 30 min   Activity Tolerance Patient tolerated treatment well   Behavior During Therapy [facility] for tasks assessed/performed      Past Medical History:  Diagnosis Date  . Afib ([place])   . Anemia   . CAD (coronary artery disease)   . Chronic diastolic heart failure ([place])   . HTN (hypertension)   . Mitral regurgitation    Moderate, echo, 07/2012  . Pulmonary hypertension ([place])     Past Surgical History:  Procedure Laterality Date  . JOINT REPLACEMENT Right Feb 2015   Dr. Case- Ledell Noss  . TOTAL KNEE ARTHROPLASTY Left     There were no vitals filed for this visit.        [facility] PT Assessment - 08/02/16 0001      Assessment   Medical Diagnosis R LE venous stasis ulcer    Referring Provider Glory Buff    Onset Date/Surgical Date --  05/06/2016   Next MD Visit not scheduled   going to vascular on 08/24/2016   Prior Therapy skilled PT wound services earlier thsi year      Precautions   Precautions None     Restrictions   Weight Bearing Restrictions No     Balance Screen   Has the patient fallen in the past 6 months No   Has the patient had a decrease in activity level because of a fear of falling?  No   Is the patient reluctant to leave their home because of a fear of falling?  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  She self treated with hydrogen peroxide despite the staff here telling her not to use peroxide on her wounds.  The wound continued to increase in size and pain therefore she went to her MD who referred her to therapy for wound care as well as a vein specialist.    Patient and Family Stated Goals For the wound to heal    Date of Onset 05/06/16   Prior Treatments antibiotics, self-dressing with xeroform/gauze    Pain Assessment 0-10   Pain Score 10-Worst pain ever   Pain Type Chronic pain   Pain Location Leg   Pain Orientation Right;Lower   Pain Descriptors / Indicators Aching;Constant   Pain Onset With Activity   Patients Stated Pain Goal 0   Pain Intervention(s) Medication (See eMAR)   Multiple Pain Sites No   Evaluation and Treatment Procedures Explained to Patient/Family Yes   Evaluation and Treatment Procedures  agreed to   Wound Properties Date First Assessed: 08/02/16 Time First Assessed: 1125 Wound Type: Venous stasis ulcer Location: Leg Location Orientation: Right;Lower Wound Description (Comments): R lateral lower LE  Present on Admission: Yes   Dressing Type Gauze (Comment)  xeroform and bandaid   Dressing Changed Changed   Dressing Status Clean;Dry;Intact   Dressing Change Frequency PRN   Site / Wound Assessment Clean;Dry   % Wound base Red or Granulating 10%   % Wound base Yellow/Fibrinous Exudate 90%   Peri-wound Assessment Edema;Hemosiderin   Wound Length (cm) 2.5 cm   Wound Width (cm) 2 cm   Wound Depth (cm) 0.4 cm   Margins Attached edges (approximated)   Closure None   Drainage Amount None   Drainage Description No odor   Treatment Cleansed;Debridement (Selective);Other (Comment)  compression dressing    Wound Properties Date First  Assessed: 12/03/15 Time First Assessed: 0951 Wound Type: Other (Comment) Location: Ankle Location Orientation: Right Wound Description (Comments): lateral aspect Present on Admission: Yes   Selective Debridement - Location lateral wound    Selective Debridement - Tools Used Forceps   Selective Debridement - Tissue Removed slough   Wound Therapy - Clinical Statement Pt is known to this clinic.  We have seen her twice before for non-healing wounds.  She states that she wears her compression stockings everyday but she still developed a sore on her right LE.  She is being referred to her skilled physical therapy for wound care but does have a vascular appointment on 08/24/2016.  Jody [name] will benefit from skilled physical therapy for debridement to acquire a clean wound bed to improve healing as well as compression dressing to decrease edema to increase healing.    Wound Therapy - Functional Problem List difficulty walking and standing due to pain    Factors Delaying/Impairing Wound Healing Infection - systemic/local;Vascular compromise   Hydrotherapy Plan Debridement;Dressing change;Patient/family education   Wound Therapy - Frequency --  2x week x 4 weeks    Wound Therapy - Current Recommendations PT   Wound Plan debridement and compression bandaging.    Dressing  silverhydrofiber followed by profore multilayer compression bandaging                          PT Education - 08/02/16 1213    Education provided Yes   Education Details Not to use peroxide on wound site.  Keep dressing dry and intact until next visit unless dressing is painful then take dressing off.    Person(s) Educated Patient   Methods Explanation   Comprehension Verbalized understanding          PT Short Term Goals - 08/02/16 1217      PT SHORT TERM GOAL #1   Title Patient to experience pain no more than 5/10 to allow pt to be standing/walking  for 30 mintues in comfort to tolerate job activities.     Time 2   Period Weeks   Status New     PT SHORT TERM GOAL #2   Title Wound granulationto have increased by at least 50% to decrease risk of infection.   Time 2   Period Weeks   Status New     PT SHORT TERM GOAL #3   Title Pt drainage to be minimal to decrease risk of infection    Time 2   Period Weeks   Status New           PT Long  Term Goals - 2016-08-24 1219      PT LONG TERM GOAL #1   Title  Pain 0/10 to allow pt to be up walking/standing for 2 hours without pain to be able to complete her work activity in comfort.    Time 4   Period Weeks   Status New     PT LONG TERM GOAL #2   Title Pt wound to be 100% granulated for reduced risk of infection    Time 4   Period Weeks   Status New     PT LONG TERM GOAL #3   Title Wound to have decreased in size to no greater than 1x.5x .5  for pt to feel comfortable with self treatment.    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 PVD (peripheral vascular disease) ([place]) 07/05/2016  . Venous stasis ulcer ([place]) 03/15/2016  . Cellulitis 12/08/2015  . (HFpEF) heart failure with preserved ejection fraction ([place]) 04/16/2013  . Mitral regurgitation   . Anemia   .  HTN (hypertension)   . Atrial fibrillation ([place]) 04/02/2009  . DIASTOLIC HEART FAILURE, CHRONIC 04/02/2009    Jody [name], PT CLT [phone] 24-Aug-2016, 12:23 PM  [place] [address] [place], [place], [address] Phone: [phone]   Fax:  [phone]  Name: Jody [name] MRN: [id number] Date of Birth: 1927/06/05

## 2016-08-02 NOTE — Telephone Encounter (Signed)
08/02/16 I left daughter a message that Jenny Reichmann suggested this patient come in beofre 9 days .Marland Kitchen... She said that the wound had to be changed.  I called and left a message letting daughter know that .  There are other appts scheduled but when I made them the daughter said she could only come in on her days off.

## 2016-08-03 ENCOUNTER — Telehealth (HOSPITAL_COMMUNITY): Payer: Self-pay | Admitting: Physical Therapy

## 2016-08-03 NOTE — Telephone Encounter (Signed)
Ms Jody [name] (Pt daughter) called back to confirm apptment for May 4 @ 12 with CR . NF 08/03/16

## 2016-08-05 ENCOUNTER — Ambulatory Visit (HOSPITAL_COMMUNITY): Payer: Medicare Other | Admitting: Physical Therapy

## 2016-08-05 DIAGNOSIS — L97311 Non-pressure chronic ulcer of right ankle limited to breakdown of skin: Secondary | ICD-10-CM | POA: Diagnosis not present

## 2016-08-05 DIAGNOSIS — M25571 Pain in right ankle and joints of right foot: Secondary | ICD-10-CM | POA: Diagnosis not present

## 2016-08-05 DIAGNOSIS — R262 Difficulty in walking, not elsewhere classified: Secondary | ICD-10-CM

## 2016-08-05 DIAGNOSIS — L97319 Non-pressure chronic ulcer of right ankle with unspecified severity: Secondary | ICD-10-CM | POA: Diagnosis not present

## 2016-08-05 DIAGNOSIS — I83013 Varicose veins of right lower extremity with ulcer of ankle: Secondary | ICD-10-CM | POA: Diagnosis not present

## 2016-08-05 DIAGNOSIS — M79661 Pain in right lower leg: Secondary | ICD-10-CM | POA: Diagnosis not present

## 2016-08-05 NOTE — Therapy (Signed)
[facility] [place] [address] [place], [place], [address] Phone: [phone]   Fax:  [phone]  Wound Care Therapy  Patient Details  Name: Jody [name] MRN: [id number] Date of Birth: 1927/11/27 Referring Provider: Glory Buff   Encounter Date: 08/05/2016      PT End of Session - 08/05/16 1520    Visit Number 2   Number of Visits 8   Date for PT Re-Evaluation 09/01/16   Authorization Type [employer] medicare   Authorization - Visit Number 2   Authorization - Number of Visits 8   PT Start Time 1115   PT Stop Time 1135   PT Time Calculation (min) 20 min   Activity Tolerance Patient tolerated treatment well   Behavior During Therapy [facility] for tasks assessed/performed      Past Medical History:  Diagnosis Date  . Afib ([place])   . Anemia   . CAD (coronary artery disease)   . Chronic diastolic heart failure ([place])   . HTN (hypertension)   . Mitral regurgitation    Moderate, echo, 07/2012  . Pulmonary hypertension ([place])     Past Surgical History:  Procedure Laterality Date  . JOINT REPLACEMENT Right Feb 2015   Dr. Case- Ledell Noss  . TOTAL KNEE ARTHROPLASTY Left     There were no vitals filed for this visit.                  Wound Therapy - 08/05/16 1513    Subjective PT states that her dressing is painful. States if she did not come in today she would have cut it off herself.     Patient and Family Stated Goals For the wound to heal    Date of Onset 05/06/16   Prior Treatments antibiotics, self-dressing with xeroform/gauze    Pain Assessment 0-10   Pain Score 9    Pain Type Chronic pain   Pain Location Leg   Pain Orientation Right;Lower   Pain Descriptors / Indicators Aching   Pain Onset Other (Comment)  with leg elevated    Patients Stated Pain Goal 0   Pain Intervention(s) Medication (See eMAR)   Multiple Pain Sites No   Evaluation and Treatment Procedures Explained to Patient/Family Yes   Evaluation  and Treatment Procedures agreed to   Wound Properties Date First Assessed: 08/02/16 Time First Assessed: 1125 Wound Type: Venous stasis ulcer Location: Leg Location Orientation: Right;Lower Wound Description (Comments): R lateral lower LE  Present on Admission: Yes   Dressing Type Gauze (Comment);Silver hydrofiber  xeroform and bandaid   Dressing Changed Changed   Dressing Status Clean;Dry;Intact   Dressing Change Frequency PRN   Site / Wound Assessment Clean;Dry   % Wound base Red or Granulating 20%   % Wound base Yellow/Fibrinous Exudate 80%   Peri-wound Assessment Edema;Hemosiderin   Margins Attached edges (approximated)   Closure None   Drainage Amount None   Drainage Description No odor   Treatment Cleansed;Debridement (Selective)  dressing change    Selective Debridement - Location lateral wound    Selective Debridement - Tools Used Forceps   Selective Debridement - Tissue Removed slough   Wound Therapy - Clinical Statement Therapist expressed concern that pt arterial flow may be compromised.  Pt has an appointment with vascular for her veins on the 26th.  Granddaughter was with pt taking notes therapist explained to make sure the MD knows therapist concerns and requests an ABI if MD feels that it is warrented.  Wound Therapy - Functional Problem List difficulty walking and standing due to pain    Factors Delaying/Impairing Wound Healing Infection - systemic/local;Vascular compromise   Hydrotherapy Plan Debridement;Dressing change;Patient/family education   Wound Therapy - Frequency --  2x week x 4 weeks    Wound Therapy - Current Recommendations PT   Wound Plan debridement and dressing change.   Dressing  medihoney,2x2 and kerlix.                  PT Education - 08/05/16 1519    Education provided Yes   Education Details to perform 10 ankle pumps every hour to promote blood flow.    Person(s) Educated Patient   Methods Explanation   Comprehension Verbalized  understanding          PT Short Term Goals - 08/05/16 1521      PT SHORT TERM GOAL #1   Title Patient to experience pain no more than 5/10 to allow pt to be standing/walking  for 30 mintues in comfort to tolerate job activities.    Time 2   Period Weeks   Status On-going     PT SHORT TERM GOAL #2   Title Wound granulationto have increased by at least 50% to decrease risk of infection.   Time 2   Period Weeks   Status On-going     PT SHORT TERM GOAL #3   Title Pt drainage to be minimal to decrease risk of infection    Time 2   Period Weeks   Status On-going           PT Long Term Goals - 08/05/16 1522      PT LONG TERM GOAL #1   Title  Pain 0/10 to allow pt to be up walking/standing for 2 hours without pain to be able to complete her work activity in comfort.    Time 4   Period Weeks   Status On-going     PT LONG TERM GOAL #2   Title Pt wound to be 100% granulated for reduced risk of infection    Time 4   Period Weeks   Status On-going     PT LONG TERM GOAL #3   Title Wound to have decreased in size to no greater than 1x.5x .5  for pt to feel comfortable with self treatment.    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 PVD (peripheral vascular disease) ([place]) 07/05/2016  . Venous stasis ulcer ([place]) 03/15/2016  . Cellulitis 12/08/2015  . (HFpEF) heart failure with preserved ejection fraction (  [place]) 04/16/2013  . Mitral regurgitation   . Anemia   . HTN (hypertension)   . Atrial fibrillation ([place]) 04/02/2009  . DIASTOLIC HEART FAILURE, CHRONIC 04/02/2009  Rayetta Humphrey, PT CLT [phone] 08/05/2016, 3:23 PM  [place] [address] [place], [place], [address] Phone: [phone]   Fax:  [phone]  Name: Jody [name] MRN: [id number] Date of Birth: [birth year]

## 2016-08-07 DIAGNOSIS — M7632 Iliotibial band syndrome, left leg: Secondary | ICD-10-CM | POA: Diagnosis not present

## 2016-08-07 DIAGNOSIS — H6981 Other specified disorders of Eustachian tube, right ear: Secondary | ICD-10-CM | POA: Diagnosis not present

## 2016-08-10 DIAGNOSIS — I10 Essential (primary) hypertension: Secondary | ICD-10-CM | POA: Diagnosis not present

## 2016-08-10 DIAGNOSIS — M1990 Unspecified osteoarthritis, unspecified site: Secondary | ICD-10-CM | POA: Diagnosis not present

## 2016-08-11 ENCOUNTER — Ambulatory Visit (HOSPITAL_COMMUNITY): Payer: Medicare Other | Admitting: Physical Therapy

## 2016-08-11 DIAGNOSIS — M25571 Pain in right ankle and joints of right foot: Secondary | ICD-10-CM

## 2016-08-11 DIAGNOSIS — L97311 Non-pressure chronic ulcer of right ankle limited to breakdown of skin: Secondary | ICD-10-CM

## 2016-08-11 DIAGNOSIS — I83013 Varicose veins of right lower extremity with ulcer of ankle: Secondary | ICD-10-CM

## 2016-08-11 DIAGNOSIS — M79661 Pain in right lower leg: Secondary | ICD-10-CM | POA: Diagnosis not present

## 2016-08-11 DIAGNOSIS — R262 Difficulty in walking, not elsewhere classified: Secondary | ICD-10-CM

## 2016-08-11 DIAGNOSIS — L97319 Non-pressure chronic ulcer of right ankle with unspecified severity: Secondary | ICD-10-CM | POA: Diagnosis not present

## 2016-08-11 NOTE — Therapy (Signed)
[facility] [address] [place], [place], [address] Phone: [phone]   Fax:  [phone]  Wound Care Therapy  Patient Details  Name: Jody [name] MRN: [id number] Date of Birth: 08-10-1927 Referring Provider: Glory Buff   Encounter Date: 08/11/2016      PT End of Session - 08/11/16 1706    Visit Number 3   Number of Visits 8   Date for PT Re-Evaluation 09/01/16   Authorization Type [employer] healthcare medicare   Authorization - Visit Number 3   Authorization - Number of Visits 8   PT Start Time 1115   PT Stop Time 1140   PT Time Calculation (min) 25 min   Activity Tolerance Patient tolerated treatment well   Behavior During Therapy [facility] for tasks assessed/performed      Past Medical History:  Diagnosis Date  . Afib ([place])   . Anemia   . CAD (coronary artery disease)   . Chronic diastolic heart failure ([place])   . HTN (hypertension)   . Mitral regurgitation    Moderate, echo, 07/2012  . Pulmonary hypertension ([place])     Past Surgical History:  Procedure Laterality Date  . JOINT REPLACEMENT Right Feb 2015   Dr. Case- Ledell Noss  . TOTAL KNEE ARTHROPLASTY Left     There were no vitals filed for this visit.                  Wound Therapy - 08/11/16 1618    Subjective PT states that her dressing is painful. States if she did not come in today she would have cut it off herself.     Patient and Family Stated Goals For the wound to heal    Date of Onset 05/06/16   Prior Treatments antibiotics, self-dressing with xeroform/gauze    Pain Assessment No/denies pain   Pain Score 9   with debridment   Pain Type Chronic pain   Pain Location Leg   Pain Orientation Right;Lower   Pain Descriptors / Indicators Aching   Pain Onset With Activity   Patients Stated Pain Goal 0   Pain Intervention(s) Medication (See eMAR)   Multiple Pain Sites No   Evaluation and Treatment Procedures Explained to Patient/Family Yes   Evaluation and Treatment Procedures agreed to   Wound Properties Date First Assessed: 08/02/16 Time First Assessed: 1125 Wound Type: Venous stasis ulcer Location: Leg Location Orientation: Right;Lower Wound Description (Comments): R lateral lower LE  Present on Admission: Yes   Dressing Type Gauze (Comment);Silver hydrofiber  xeroform and bandaid   Dressing Changed Changed   Dressing Status Clean;Dry;Intact   Dressing Change Frequency PRN   Site / Wound Assessment Clean;Dry   % Wound base Red or Granulating 30%   % Wound base Yellow/Fibrinous Exudate 70%   Peri-wound Assessment Edema;Hemosiderin   Margins Attached edges (approximated)   Closure None   Drainage Amount None   Drainage Description No odor   Treatment Cleansed;Debridement (Selective)   Wound Properties Date First Assessed: 12/03/15 Time First Assessed: 0951 Wound Type: Other (Comment) Location: Ankle Location Orientation: Right Wound Description (Comments): lateral aspect Present on Admission: Yes   Selective Debridement - Location wound perimeter   Selective Debridement - Tools Used Forceps   Selective Debridement - Tissue Removed slough   Wound Therapy - Clinical Statement Pt has an appointment with vascular for her veins on the 26th.  Noted indentation from wrap around ankle caused by compression placed over this.  Changed to a  small medipore tape bandage that the hose would fit over better wtihout causing uneven compression.  Debrided gently around edges (pt cannot tolerate debridement well) and dressed with medihoney gauze.  TP reported overall comfort.   Wound Therapy - Functional Problem List difficulty walking and standing due to pain    Factors Delaying/Impairing Wound Healing Infection - systemic/local;Vascular compromise   Hydrotherapy Plan Debridement;Dressing change;Patient/family education   Wound Therapy - Frequency --  2x week x 4 weeks    Wound Therapy - Current Recommendations PT   Wound Plan debridement and  dressing change using appropriate bandaging conducive to wound environment.   Dressing  medihoney,2x2 and medipore                   PT Short Term Goals - 08/05/16 1521      PT SHORT TERM GOAL #1   Title Patient to experience pain no more than 5/10 to allow pt to be standing/walking  for 30 mintues in comfort to tolerate job activities.    Time 2   Period Weeks   Status On-going     PT SHORT TERM GOAL #2   Title Wound granulationto have increased by at least 50% to decrease risk of infection.   Time 2   Period Weeks   Status On-going     PT SHORT TERM GOAL #3   Title Pt drainage to be minimal to decrease risk of infection    Time 2   Period Weeks   Status On-going           PT Long Term Goals - 08/05/16 1522      PT LONG TERM GOAL #1   Title  Pain 0/10 to allow pt to be up walking/standing for 2 hours without pain to be able to complete her work activity in comfort.    Time 4   Period Weeks   Status On-going     PT LONG TERM GOAL #2   Title Pt wound to be 100% granulated for reduced risk of infection    Time 4   Period Weeks   Status On-going     PT LONG TERM GOAL #3   Title Wound to have decreased in size to no greater than 1x.5x .5  for pt to feel comfortable with self treatment.    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 PVD (peripheral vascular disease) ([place]) 07/05/2016  . Venous stasis ulcer ([place]) 03/15/2016  . Cellulitis 12/08/2015  . (HFpEF)  heart failure with preserved ejection fraction ([place]) 04/16/2013  . Mitral regurgitation   . Anemia   . HTN (hypertension)   . Atrial fibrillation ([place]) 04/02/2009  . DIASTOLIC HEART FAILURE, CHRONIC 04/02/2009    Teena Irani, PTA/CLT [phone]  08/11/2016, 5:07 PM  [place] [address] [place], [place], [address] Phone: [phone]   Fax:  [phone]  Name:  JANEANN PAISLEY MRN: [id number] Date of Birth: 01-28-1928

## 2016-08-15 ENCOUNTER — Ambulatory Visit (HOSPITAL_COMMUNITY): Payer: Medicare Other | Admitting: Physical Therapy

## 2016-08-15 ENCOUNTER — Other Ambulatory Visit: Payer: Self-pay | Admitting: Family

## 2016-08-15 ENCOUNTER — Telehealth (HOSPITAL_COMMUNITY): Payer: Self-pay | Admitting: Family

## 2016-08-15 ENCOUNTER — Telehealth: Payer: Self-pay | Admitting: Family

## 2016-08-15 DIAGNOSIS — L97811 Non-pressure chronic ulcer of other part of right lower leg limited to breakdown of skin: Principal | ICD-10-CM

## 2016-08-15 DIAGNOSIS — I739 Peripheral vascular disease, unspecified: Secondary | ICD-10-CM

## 2016-08-15 DIAGNOSIS — I83018 Varicose veins of right lower extremity with ulcer other part of lower leg: Secondary | ICD-10-CM

## 2016-08-15 NOTE — Telephone Encounter (Signed)
Pt requesting refill of Keflex Pt has continued redness and drainage Please advise

## 2016-08-15 NOTE — Telephone Encounter (Signed)
What is the name of the medication? Infection medicine  Have you contacted your pharmacy to request a refill? yes  Which pharmacy would you like this sent to? [facility] in [place].   Patient notified that their request is being sent to the clinical staff for review and that they should receive a call once it is complete. If they do not receive a call within 24 hours they can check with their pharmacy or our office.

## 2016-08-15 NOTE — Telephone Encounter (Signed)
08/15/16  Daughter called cx - no reason given

## 2016-08-16 ENCOUNTER — Encounter: Payer: Self-pay | Admitting: Family

## 2016-08-16 ENCOUNTER — Ambulatory Visit (INDEPENDENT_AMBULATORY_CARE_PROVIDER_SITE_OTHER): Payer: Medicare Other | Admitting: Family

## 2016-08-16 ENCOUNTER — Encounter: Payer: Self-pay | Admitting: Vascular Surgery

## 2016-08-16 VITALS — BP 130/70 | HR 81 | Temp 97.2°F | Ht 62.0 in | Wt 113.0 lb

## 2016-08-16 DIAGNOSIS — M79604 Pain in right leg: Secondary | ICD-10-CM | POA: Diagnosis not present

## 2016-08-16 DIAGNOSIS — L97811 Non-pressure chronic ulcer of other part of right lower leg limited to breakdown of skin: Secondary | ICD-10-CM | POA: Diagnosis not present

## 2016-08-16 DIAGNOSIS — I83018 Varicose veins of right lower extremity with ulcer other part of lower leg: Secondary | ICD-10-CM

## 2016-08-16 DIAGNOSIS — I739 Peripheral vascular disease, unspecified: Secondary | ICD-10-CM

## 2016-08-16 MED ORDER — TRAMADOL HCL 50 MG PO TABS: 50.0000 mg | ORAL_TABLET | Freq: Every evening | ORAL | 5 refills | Status: DC | PRN

## 2016-08-16 NOTE — Patient Instructions (Signed)
Peripheral Vascular Disease Peripheral vascular disease (PVD) is a disease of the blood vessels that are not part of your heart and brain. A simple term for PVD is poor circulation. In most cases, PVD narrows the blood vessels that carry blood from your heart to the rest of your body. This can result in a decreased supply of blood to your arms, legs, and internal organs, like your stomach or kidneys. However, it most often affects a person's lower legs and feet. There are two types of PVD.  Organic PVD. This is the more common type. It is caused by damage to the structure of blood vessels.  Functional PVD. This is caused by conditions that make blood vessels contract and tighten (spasm).  Without treatment, PVD tends to get worse over time. PVD can also lead to acute ischemic limb. This is when an arm or limb suddenly has trouble getting enough blood. This is a medical emergency. What are the causes? Each type of PVD has many different causes. The most common cause of PVD is buildup of a fatty material (plaque) inside of your arteries (atherosclerosis). Small amounts of plaque can break off from the walls of the blood vessels and become lodged in a smaller artery. This blocks blood flow and can cause acute ischemic limb. Other common causes of PVD include:  Blood clots that form inside of blood vessels.  Injuries to blood vessels.  Diseases that cause inflammation of blood vessels or cause blood vessel spasms.  Health behaviors and health history that increase your risk of developing PVD.  What increases the risk? You may have a greater risk of PVD if you:  Have a family history of PVD.  Have certain medical conditions, including: ? High cholesterol. ? Diabetes. ? High blood pressure (hypertension). ? Coronary heart disease. ? Past problems with blood clots. ? Past injury, such as burns or a broken bone. These may have damaged blood vessels in your limbs. ? Buerger disease. This is  caused by inflamed blood vessels in your hands and feet. ? Some forms of arthritis. ? Rare birth defects that affect the arteries in your legs.  Use tobacco.  Do not get enough exercise.  Are obese.  Are age 50 or older.  What are the signs or symptoms? PVD may cause many different symptoms. Your symptoms depend on what part of your body is not getting enough blood. Some common signs and symptoms include:  Cramps in your lower legs. This may be a symptom of poor leg circulation (claudication).  Pain and weakness in your legs while you are physically active that goes away when you rest (intermittent claudication).  Leg pain when at rest.  Leg numbness, tingling, or weakness.  Coldness in a leg or foot, especially when compared with the other leg.  Skin or hair changes. These can include: ? Hair loss. ? Shiny skin. ? Pale or bluish skin. ? Thick toenails.  Inability to get or maintain an erection (erectile dysfunction).  People with PVD are more prone to developing ulcers and sores on their toes, feet, or legs. These may take longer than normal to heal. How is this diagnosed? Your health care provider may diagnose PVD from your signs and symptoms. The health care provider will also do a physical exam. You may have tests to find out what is causing your PVD and determine its severity. Tests may include:  Blood pressure recordings from your arms and legs and measurements of the strength of your pulses (  pulse volume recordings).  Imaging studies using sound waves to take pictures of the blood flow through your blood vessels (Doppler ultrasound).  Injecting a dye into your blood vessels before having imaging studies using: ? X-rays (angiogram or arteriogram). ? Computer-generated X-rays (CT angiogram). ? A powerful electromagnetic field and a computer (magnetic resonance angiogram or MRA).  How is this treated? Treatment for PVD depends on the cause of your condition and the  severity of your symptoms. It also depends on your age. Underlying causes need to be treated and controlled. These include long-lasting (chronic) conditions, such as diabetes, high cholesterol, and high blood pressure. You may need to first try making lifestyle changes and taking medicines. Surgery may be needed if these do not work. Lifestyle changes may include:  Quitting smoking.  Exercising regularly.  Following a low-fat, low-cholesterol diet.  Medicines may include:  Blood thinners to prevent blood clots.  Medicines to improve blood flow.  Medicines to improve your blood cholesterol levels.  Surgical procedures may include:  A procedure that uses an inflated balloon to open a blocked artery and improve blood flow (angioplasty).  A procedure to put in a tube (stent) to keep a blocked artery open (stent implant).  Surgery to reroute blood flow around a blocked artery (peripheral bypass surgery).  Surgery to remove dead tissue from an infected wound on the affected limb.  Amputation. This is surgical removal of the affected limb. This may be necessary in cases of acute ischemic limb that are not improved through medical or surgical treatments.  Follow these instructions at home:  Take medicines only as directed by your health care provider.  Do not use any tobacco products, including cigarettes, chewing tobacco, or electronic cigarettes. If you need help quitting, ask your health care provider.  Lose weight if you are overweight, and maintain a healthy weight as directed by your health care provider.  Eat a diet that is low in fat and cholesterol. If you need help, ask your health care provider.  Exercise regularly. Ask your health care provider to suggest some good activities for you.  Use compression stockings or other mechanical devices as directed by your health care provider.  Take good care of your feet. ? Wear comfortable shoes that fit well. ? Check your feet  often for any cuts or sores. Contact a health care provider if:  You have cramps in your legs while walking.  You have leg pain when you are at rest.  You have coldness in a leg or foot.  Your skin changes.  You have erectile dysfunction.  You have cuts or sores on your feet that are not healing. Get help right away if:  Your arm or leg turns cold and blue.  Your arms or legs become red, warm, swollen, painful, or numb.  You have chest pain or trouble breathing.  You suddenly have weakness in your face, arm, or leg.  You become very confused or lose the ability to speak.  You suddenly have a very bad headache or lose your vision. This information is not intended to replace advice given to you by your health care provider. Make sure you discuss any questions you have with your health care provider. Document Released: 04/28/2004 Document Revised: 08/27/2015 Document Reviewed: 08/29/2013 Elsevier Interactive Patient Education  2017 [employer]  

## 2016-08-16 NOTE — Progress Notes (Signed)
   Subjective:    Patient ID: Jody [name], female    DOB: [birth year], 83 y.o.   MRN: [id number]  HPI Pt presents to the office today with recurrent venous statis ulcer and leg pain. Pt has PVD and has an appt with Vascular on May 23. Pt is followed by wound center two days a week. Pt states this wound will improve, but "always comes back". Pt states this has been going on for over a year now.   States she has intermittent pain that is worse at night of 10 out 10. Pt has ultram, but did not have refills and has been out of it for a few weeks. Pt states tylenol is not working. She also states her keflex is out and requests refill.    Review of Systems  Cardiovascular: Positive for leg swelling.  Skin: Positive for wound.       Right Leg pain   All other systems reviewed and are negative.      Objective:   Physical Exam  Constitutional: She is oriented to person, place, and time. She appears well-developed and well-nourished. No distress.  HENT:  Head: Normocephalic.  Eyes: Pupils are equal, round, and reactive to light.  Cardiovascular: Normal rate, regular rhythm and intact distal pulses.   Murmur heard. Pulmonary/Chest: Effort normal and breath sounds normal. No respiratory distress. She has no wheezes.  Musculoskeletal: Normal range of motion. She exhibits no edema or tenderness.  Neurological: She is alert and oriented to person, place, and time.  Skin:  Wound on right lower leg is dressed and has appt with wound care tomorrow. Will leave dressed.    Psychiatric: She has a normal mood and affect. Her behavior is normal. Judgment and thought content normal.  Vitals reviewed.    BP 130/70   Pulse 81   Temp 97.2 F (36.2 C) (Oral)   Ht 5\' 2"  (1.575 m)   Wt 113 lb (51.3 kg)   BMI 20.67 kg/m       Assessment & Plan:  1. Venous stasis ulcer of other part of right lower leg limited to breakdown of skin, unspecified whether varicose veins present (HCC) - traMADol  (ULTRAM) 50 MG tablet; Take 1-2 tablets (50-100 mg total) by mouth at bedtime as needed.  Dispense: 60 tablet; Refill: 5  2. Pain of right lower extremity - traMADol (ULTRAM) 50 MG tablet; Take 1-2 tablets (50-100 mg total) by mouth at bedtime as needed.  Dispense: 60 tablet; Refill: 5  3. PVD (peripheral vascular disease) (HCC) - traMADol (ULTRAM) 50 MG tablet; Take 1-2 tablets (50-100 mg total) by mouth at bedtime as needed.  Dispense: 60 tablet; Refill: 5  Continue wound care Keep Vascular appt Keep clean and dry Report any s/s of increased pain, erythemas, or drainage Will hold off on antibiotic at this time   Evelina Dun, FNP

## 2016-08-16 NOTE — Telephone Encounter (Signed)
Has pt followed up with vascular? Needs to see wound care

## 2016-08-16 NOTE — Telephone Encounter (Signed)
lmtcb - patient had appointment at wound clinic on 08/02/16, did she go?

## 2016-08-18 ENCOUNTER — Encounter (HOSPITAL_COMMUNITY): Payer: Medicare Other

## 2016-08-18 ENCOUNTER — Encounter: Payer: Medicare Other | Admitting: Vascular Surgery

## 2016-08-19 ENCOUNTER — Ambulatory Visit (HOSPITAL_COMMUNITY): Payer: Medicare Other | Admitting: Physical Therapy

## 2016-08-19 DIAGNOSIS — R262 Difficulty in walking, not elsewhere classified: Secondary | ICD-10-CM

## 2016-08-19 DIAGNOSIS — L97319 Non-pressure chronic ulcer of right ankle with unspecified severity: Secondary | ICD-10-CM | POA: Diagnosis not present

## 2016-08-19 DIAGNOSIS — M79661 Pain in right lower leg: Secondary | ICD-10-CM | POA: Diagnosis not present

## 2016-08-19 DIAGNOSIS — I83013 Varicose veins of right lower extremity with ulcer of ankle: Secondary | ICD-10-CM

## 2016-08-19 DIAGNOSIS — L97311 Non-pressure chronic ulcer of right ankle limited to breakdown of skin: Secondary | ICD-10-CM | POA: Diagnosis not present

## 2016-08-19 DIAGNOSIS — M25571 Pain in right ankle and joints of right foot: Secondary | ICD-10-CM | POA: Diagnosis not present

## 2016-08-19 NOTE — Therapy (Signed)
[facility] [place], [place], [address] Phone: [phone]   Fax:  [phone]  Wound Care Therapy  Patient Details  Name: Jody [name] MRN: [id number] Date of Birth: Feb 21, 1928 Referring Provider: Glory Buff   Encounter Date: 08/19/2016      PT End of Session - 08/19/16 1104    Visit Number 4   Number of Visits 8   Date for PT Re-Evaluation 09/01/16   Authorization Type [employer] medicare   Authorization - Visit Number 4   Authorization - Number of Visits 8   PT Start Time 1030   PT Stop Time 1050   PT Time Calculation (min) 20 min   Activity Tolerance Patient tolerated treatment well   Behavior During Therapy [facility] for tasks assessed/performed      Past Medical History:  Diagnosis Date  . Afib ([place])   . Anemia   . CAD (coronary artery disease)   . Chronic diastolic heart failure ([place])   . HTN (hypertension)   . Mitral regurgitation    Moderate, echo, 07/2012  . Pulmonary hypertension ([place])     Past Surgical History:  Procedure Laterality Date  . JOINT REPLACEMENT Right Feb 2015   Dr. Case- [facility] Noss  . TOTAL KNEE ARTHROPLASTY Left     There were no vitals filed for this visit.                  Wound Therapy - 08/19/16 1058    Subjective Pt states that she is going to the vascular MD on the 23rd.  States that her leg is more painful at night.    She recieved pain medication but taking two at night still does not control the pain.    Patient and Family Stated Goals For the wound to heal    Date of Onset 05/06/16   Prior Treatments antibiotics, self-dressing with xeroform/gauze    Pain Assessment --  With debridement and at night.  Pain at night goes to 10/10.   Evaluation and Treatment Procedures Explained to Patient/Family Yes   Evaluation and Treatment Procedures agreed to   Wound Properties Date First Assessed: 08/02/16 Time First Assessed: 4097 Wound Type: Venous stasis ulcer  Location: Leg Location Orientation: Right;Lower Wound Description (Comments): R lateral lower LE  Present on Admission: Yes   Dressing Type Gauze (Comment)  medihoney   Dressing Changed Changed   Dressing Status Clean;Dry;Intact   Dressing Change Frequency PRN   Site / Wound Assessment Clean;Dry   % Wound base Red or Granulating 30%   % Wound base Yellow/Fibrinous Exudate 70%   Peri-wound Assessment Edema;Hemosiderin   Wound Length (cm) 3 cm  was 2.5   Wound Width (cm) 2 cm  was 2   Wound Depth (cm) 0.3 cm  was .4   Margins Attached edges (approximated)   Closure None   Drainage Amount None   Drainage Description No odor   Treatment Cleansed;Debridement (Selective)   Wound Properties Date First Assessed: 12/03/15 Time First Assessed: 0951 Wound Type: Other (Comment) Location: Ankle Location Orientation: Right Wound Description (Comments): lateral aspect Present on Admission: Yes   Selective Debridement - Location wound bed    Selective Debridement - Tools Used Scalpel   Selective Debridement - Tissue Removed slough   Wound Therapy - Clinical Statement PT is more tolerant of debridement using scapel rather than forceps.  Therapist explained to pt that she is seeing the vascular MD to see if her arteries  are clogged as pt thought she was going to see vasuclar MD re her veins.  Explained to pt that we know that her veins are bad but that therapist suspects that she has mixed insufficincies.    Wound Therapy - Functional Problem List difficulty walking and standing due to pain    Factors Delaying/Impairing Wound Healing Infection - systemic/local;Vascular compromise   Hydrotherapy Plan Debridement;Dressing change;Patient/family education   Wound Therapy - Frequency --  2x week x 4 weeks    Wound Therapy - Current Recommendations PT   Wound Plan debridement and dressing change using appropriate bandaging conducive to wound environment.   Dressing  medihoney,2x2 and medipore                    PT Short Term Goals - 08/05/16 1521      PT SHORT TERM GOAL #1   Title Patient to experience pain no more than 5/10 to allow pt to be standing/walking  for 30 mintues in comfort to tolerate job activities.    Time 2   Period Weeks   Status On-going     PT SHORT TERM GOAL #2   Title Wound granulationto have increased by at least 50% to decrease risk of infection.   Time 2   Period Weeks   Status On-going     PT SHORT TERM GOAL #3   Title Pt drainage to be minimal to decrease risk of infection    Time 2   Period Weeks   Status On-going           PT Long Term Goals - 08/05/16 1522      PT LONG TERM GOAL #1   Title  Pain 0/10 to allow pt to be up walking/standing for 2 hours without pain to be able to complete her work activity in comfort.    Time 4   Period Weeks   Status On-going     PT LONG TERM GOAL #2   Title Pt wound to be 100% granulated for reduced risk of infection    Time 4   Period Weeks   Status On-going     PT LONG TERM GOAL #3   Title Wound to have decreased in size to no greater than 1x.5x .5  for pt to feel comfortable with self treatment.    Time 4   Period Weeks   Status On-going               Plan - 08/19/16 1105    Clinical Impression Statement Wound has increased in size.  Granulation has not improved significantly due to pt intolerance of debridement.  Pt able to tolerate scapel with increased debridement this session than in the past.  Pt does not want to schedule more visits until she sees the MD.  Therapist explained that she was fairly certain that the MD will want her to continue therapy.    Rehab Potential Fair   PT Frequency 2x / week   PT Duration 4 weeks   PT Treatment/Interventions ADLs/Self Care Home Management;Other (comment)  debridement as tolerated    PT Next Visit Plan Use scapel for debridement.  No compression at this time.  Look at results of ABI. Pt to continue coming for wound care once a  week.       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 PVD (peripheral vascular disease) ([place]) 07/05/2016  . Venous stasis ulcer ([place]) 03/15/2016  . Cellulitis 12/08/2015  . (HFpEF) heart failure with preserved ejection fraction ([place]) 04/16/2013  . Mitral regurgitation   . Anemia   . HTN (hypertension)   . Atrial fibrillation ([place]) 04/02/2009  . DIASTOLIC HEART FAILURE, CHRONIC 04/02/2009   Rayetta Humphrey, PT CLT [phone] 08/19/2016, 11:08 AM  [facility] [address] [place], [place], [address] Phone: [phone]   Fax:  [phone]  Name: Jody [name] MRN: [id number] Date of Birth: 10-10-1927

## 2016-08-22 ENCOUNTER — Telehealth (HOSPITAL_COMMUNITY): Payer: Self-pay | Admitting: Physical Therapy

## 2016-08-22 NOTE — Telephone Encounter (Signed)
Patient canceled she have another Md appt concerning her wound.

## 2016-08-24 ENCOUNTER — Ambulatory Visit (HOSPITAL_COMMUNITY)
Admission: RE | Admit: 2016-08-24 | Discharge: 2016-08-24 | Disposition: A | Discharge status: Home / Self Care | Payer: Medicare Other | Source: Ambulatory Visit | Attending: Vascular Surgery | Admitting: Vascular Surgery

## 2016-08-24 ENCOUNTER — Encounter: Payer: Self-pay | Admitting: Vascular Surgery

## 2016-08-24 ENCOUNTER — Ambulatory Visit (INDEPENDENT_AMBULATORY_CARE_PROVIDER_SITE_OTHER): Payer: Medicare Other | Admitting: Vascular Surgery

## 2016-08-24 VITALS — BP 153/95 | HR 77 | Temp 97.1°F | Resp 16 | Ht 62.0 in | Wt 115.0 lb

## 2016-08-24 DIAGNOSIS — I83028 Varicose veins of left lower extremity with ulcer other part of lower leg: Secondary | ICD-10-CM | POA: Diagnosis not present

## 2016-08-24 DIAGNOSIS — I872 Venous insufficiency (chronic) (peripheral): Secondary | ICD-10-CM | POA: Diagnosis not present

## 2016-08-24 DIAGNOSIS — I739 Peripheral vascular disease, unspecified: Secondary | ICD-10-CM

## 2016-08-24 DIAGNOSIS — L97829 Non-pressure chronic ulcer of other part of left lower leg with unspecified severity: Secondary | ICD-10-CM | POA: Diagnosis not present

## 2016-08-24 NOTE — Telephone Encounter (Signed)
Patient has a follow up appointment scheduled on 5-23 for wound care.

## 2016-08-24 NOTE — Progress Notes (Signed)
Patient name: Jody [name] MRN: [id number] DOB: 1927/12/22 Sex: female   [place]:    Venous stasis ulcer right lower extremity. The consult is from [place].  HPI:   Jody [name] is a 83 y.o. female, who is referred with a nonhealing ulcer on her lateral leg. She has had this wound off and on for over a year. For the last several months she's been treated at the wound care center in [place], [place]. They have been doing compression therapy and aggressive wound care. She is unaware of any previous history of DVT or phlebitis.  I have reviewed the records from the office visit on 08/16/2016 with her primary care doctor. She has a history of recurrent venous stasis ulcer of the right lower extremity. Given the problems she's had with wound healing she set up for a vascular consultation.  Past Medical History:  Diagnosis Date  . Afib ([place])   . Anemia   . CAD (coronary artery disease)   . Chronic diastolic heart failure ([place])   . HTN (hypertension)   . Mitral regurgitation    Moderate, echo, 07/2012  . Pulmonary hypertension (H[facility])     Family History  Problem Relation Age of Onset  . Diabetes Mother     SOCIAL HISTORY: Social History   Social History  . Marital status: Widowed    Spouse name: N/A  . Number of children: N/A  . Years of education: N/A   Occupational History  . Not on file.   Social History Main Topics  . Smoking status: Never Smoker  . Smokeless tobacco: Never Used  . Alcohol use No  . Drug use: No  . Sexual activity: Not on file   Other Topics Concern  . Not on file   Social History Narrative  . No narrative on file    Allergies  Allergen Reactions  . Levaquin [Levofloxacin] Other (See Comments)    Patient stated vision became blurry and became weak.    Current Outpatient Prescriptions  Medication Sig Dispense Refill  . apixaban (ELIQUIS) 2.5 MG TABS tablet Take 1 tablet (2.5 mg total) by mouth 2  (two) times daily. 180 tablet 3  . diltiazem (CARTIA XT) 240 MG 24 hr capsule Take 240 mg by mouth daily.    . furosemide (LASIX) 20 MG tablet Take 1 tablet (20 mg total) by mouth daily as needed for edema. 30 tablet 3  . IRON, FERROUS GLUCONATE, PO Take 27 mg by mouth.    . traMADol (ULTRAM) 50 MG tablet Take 1-2 tablets (50-100 mg total) by mouth at bedtime as needed. 60 tablet 5  . vitamin B-12 (CYANOCOBALAMIN) 1000 MCG tablet Take 1,000 mcg by mouth daily.     No current facility-administered medications for this visit.     REVIEW OF SYSTEMS:  [X]  denotes positive finding, [ ]  denotes negative finding Cardiac  Comments:  Chest pain or chest pressure:    Shortness of breath upon exertion:    Short of breath when lying flat:    Irregular heart rhythm: X       Vascular    Pain in calf, thigh, or hip brought on by ambulation:    Pain in feet at night that wakes you up from your sleep:     Blood clot in your veins:    Leg swelling:  X       Pulmonary    Oxygen at home:    Productive cough:  Wheezing:         Neurologic    Sudden weakness in arms or legs:     Sudden numbness in arms or legs:     Sudden onset of difficulty speaking or slurred speech:    Temporary loss of vision in one eye:     Problems with dizziness:         Gastrointestinal    Blood in stool:     Vomited blood:         Genitourinary    Burning when urinating:     Blood in urine:        Psychiatric    Major depression:         Hematologic    Bleeding problems:    Problems with blood clotting too easily:        Skin    Rashes or ulcers:        Constitutional    Fever or chills:     PHYSICAL EXAM:   Vitals:   08/24/16 1417  BP: (!) 153/95  Pulse: 77  Resp: 16  Temp: 97.1 F (36.2 C)  TempSrc: Oral  SpO2: 97%  Weight: 115 lb (52.2 kg)  Height: 5\' 2"  (1.575 m)    GENERAL: The patient is a well-nourished female, in no acute distress. The vital signs are documented above. CARDIAC:  There is a regular rate and rhythm.  VASCULAR: I do not detect carotid bruits. She has palpable femoral pulses. I cannot palpate pedal pulses. She has multiphasic Doppler signals in both feet. She has hyperpigmentation bilaterally consistent with chronic venous insufficiency. She has varicose veins and telangiectasias bilaterally. She has no significant lower extremity swelling. PULMONARY: There is good air exchange bilaterally without wheezing or rales. ABDOMEN: Soft and non-tender with normal pitched bowel sounds.  MUSCULOSKELETAL: There are no major deformities or cyanosis. NEUROLOGIC: No focal weakness or paresthesias are detected. SKIN: She has a wound just above her right lateral malleolus which is 2 cm in width and 3 cm in length. PSYCHIATRIC: The patient has a normal affect.  DATA:    ARTERIAL DOPPLER STUDY: I have independently interpreted her arterial Doppler study.  On the right side there is a triphasic posterior tibial and dorsalis pedis signal. ABI is 96%. Toe pressure is 106 mmHg.  On the left side, there is a triphasic posterior tibial signal and a monophasic dorsalis pedis signal. ABI is 100%. Toe pressure on the left is 121 mmHg.  MEDICAL ISSUES:   VENOUS STASIS ULCER: Based on her exam this patient clearly has chronic venous insufficiency and this looks like a venous ulcer. Fortunately her arterial study shows no evidence of significant arterial insufficiency. She has been doing compression therapy at the wound care center. She has not been elevating her leg as much as she should we have had a long discussion about the importance of intermittent leg elevation in the proper positioning for this. I think with proper leg elevation to 3 times a day for 15 or 20 minutes that this wound should heal without any problem. Once the wound heals, she should be fitted for compression stockings to prevent recurrence. I'll be happy to see her back at any time if any new vascular issues  arise.  Deitra Mayo [facility] [phone]

## 2016-08-25 ENCOUNTER — Ambulatory Visit (HOSPITAL_COMMUNITY): Payer: Medicare Other | Admitting: Physical Therapy

## 2016-08-30 ENCOUNTER — Ambulatory Visit (INDEPENDENT_AMBULATORY_CARE_PROVIDER_SITE_OTHER): Payer: Medicare Other | Admitting: Family Medicine

## 2016-08-30 ENCOUNTER — Ambulatory Visit (HOSPITAL_COMMUNITY): Payer: Medicare Other | Admitting: Physical Therapy

## 2016-08-30 ENCOUNTER — Telehealth (HOSPITAL_COMMUNITY): Payer: Self-pay | Admitting: Physical Therapy

## 2016-08-30 ENCOUNTER — Encounter: Payer: Self-pay | Admitting: Family Medicine

## 2016-08-30 VITALS — BP 131/70 | HR 100 | Temp 97.7°F | Ht 62.0 in | Wt 113.0 lb

## 2016-08-30 DIAGNOSIS — M1990 Unspecified osteoarthritis, unspecified site: Secondary | ICD-10-CM | POA: Diagnosis not present

## 2016-08-30 DIAGNOSIS — M79651 Pain in right thigh: Secondary | ICD-10-CM

## 2016-08-30 DIAGNOSIS — M069 Rheumatoid arthritis, unspecified: Secondary | ICD-10-CM | POA: Diagnosis not present

## 2016-08-30 NOTE — Telephone Encounter (Signed)
Called pt re missed appointment:  Pt stated that she had cancelled via the phone tree and would be here on Friday.   Jody [name], [place] CLT [phone]

## 2016-08-30 NOTE — Progress Notes (Signed)
Subjective:  Patient ID: Jody [name], female    DOB: [birth year]  Age: 83 y.o. MRN: [id number]  CC: Leg Pain (pt here today c/o left leg pain)   HPI Jody [name] presents for Intractable pain in the left hip and radiating down the left side. Patient says that she's had cortisone injections before that didn't help. She knows somebody who had one into the hip joint and she did like to have that done today. The pain is intractable it is causing her to have to limp. She is also on blood thinners so she can't take anti-inflammatories. She is unable to classify pain on a 1-10 scale today. She states that she saw an orthopedist who declines to do an injection for a reason that is not clear to her. Additionally she states that Dr. Wendi Snipes gave her daughter shot so she feels that I should be able to do one as well.  History Jody [name] has a past medical history of Afib ([place]); Anemia; CAD (coronary artery disease); Chronic diastolic heart failure ([place]); HTN (hypertension); Mitral regurgitation; and Pulmonary hypertension ([place]).   She has a past surgical history that includes Joint replacement (Right, Feb 2015) and Total knee arthroplasty (Left).   Her family history includes Diabetes in her mother.She reports that she has never smoked. She has never used smokeless tobacco. She reports that she does not drink alcohol or use drugs.  Current Outpatient Prescriptions on File Prior to Visit  Medication Sig Dispense Refill  . apixaban (ELIQUIS) 2.5 MG TABS tablet Take 1 tablet (2.5 mg total) by mouth 2 (two) times daily. 180 tablet 3  . diltiazem (CARTIA XT) 240 MG 24 hr capsule Take 240 mg by mouth daily.    . furosemide (LASIX) 20 MG tablet Take 1 tablet (20 mg total) by mouth daily as needed for edema. 30 tablet 3  . IRON, FERROUS GLUCONATE, PO Take 27 mg by mouth.    . traMADol (ULTRAM) 50 MG tablet Take 1-2 tablets (50-100 mg total) by mouth at bedtime as needed. 60 tablet 5  . vitamin B-12  (CYANOCOBALAMIN) 1000 MCG tablet Take 1,000 mcg by mouth daily.     No current facility-administered medications on file prior to visit.     ROS Review of Systems Negative except as per history of present illness  Objective:  BP 131/70   Pulse 100   Temp 97.7 F (36.5 C) (Oral)   Ht 5\' 2"  (1.575 m)   Wt 113 lb (51.3 kg)   BMI 20.67 kg/m   Physical Exam  Musculoskeletal:       Left hip: She exhibits decreased range of motion, decreased strength, tenderness and bony tenderness. She exhibits no swelling, no crepitus, no deformity and no laceration.    Assessment & Plan:   Jody [name] was seen today for leg pain.  Diagnoses and all orders for this visit:  Arthritis  Pain of right thigh   I am having Ms. Orman maintain her (IRON, FERROUS GLUCONATE, PO), vitamin B-12, apixaban, furosemide, traMADol, and diltiazem.  No orders of the defined types were placed in this encounter.  I explained to the patient that I do not do intra-articular hip injections - only intramuscular into the area of the bursa. Additionally explained to her that injections into the hip joint iwhile she is taking blood thinners is risky and it should be undertaken  by specially skilled family practitioners I explained to her that if Dr. Wendi Snipes felt comfortable with that situation then  she would have to discuss that with him.  Follow-up: Return if symptoms worsen or fail to improve, for With Dr. Wendi Snipes since he does the injections she wants done.  Claretta Fraise, M.D.

## 2016-09-02 ENCOUNTER — Ambulatory Visit (HOSPITAL_COMMUNITY): Payer: Medicare Other | Attending: Family | Admitting: Physical Therapy

## 2016-09-02 DIAGNOSIS — M79661 Pain in right lower leg: Secondary | ICD-10-CM | POA: Diagnosis not present

## 2016-09-02 DIAGNOSIS — L97319 Non-pressure chronic ulcer of right ankle with unspecified severity: Secondary | ICD-10-CM | POA: Diagnosis not present

## 2016-09-02 DIAGNOSIS — R262 Difficulty in walking, not elsewhere classified: Secondary | ICD-10-CM

## 2016-09-02 DIAGNOSIS — I83013 Varicose veins of right lower extremity with ulcer of ankle: Secondary | ICD-10-CM | POA: Diagnosis not present

## 2016-09-02 DIAGNOSIS — L97311 Non-pressure chronic ulcer of right ankle limited to breakdown of skin: Secondary | ICD-10-CM | POA: Diagnosis not present

## 2016-09-02 DIAGNOSIS — M25571 Pain in right ankle and joints of right foot: Secondary | ICD-10-CM | POA: Diagnosis not present

## 2016-09-02 NOTE — Therapy (Addendum)
[place], [place], [address] Phone: [phone]   Fax:  [phone]  Wound Care Therapy  Patient Details  Name: Jody [name] MRN: [id number] Date of Birth: Sep 02, 1927  Encounter Date: 09/02/2016      PT End of Session - 09/02/16 1032    Visit Number 5   Number of Visits 16   Date for PT Re-Evaluation 09/01/16   Authorization Type [employer] healthcare medicare   Authorization - Visit Number 5   Authorization - Number of Visits 16   PT Start Time 0945   PT Stop Time 1025   PT Time Calculation (min) 40 min   Activity Tolerance Patient limited by pain   Behavior During Therapy [facility] for tasks assessed/performed      Past Medical History:  Diagnosis Date  . Afib ([place])   . Anemia   . CAD (coronary artery disease)   . Chronic diastolic heart failure ([place])   . HTN (hypertension)   . Mitral regurgitation    Moderate, echo, 07/2012  . Pulmonary hypertension ([place])     Past Surgical History:  Procedure Laterality Date  . JOINT REPLACEMENT Right Feb 2015   Dr. Case- Ledell Noss  . TOTAL KNEE ARTHROPLASTY Left     There were no vitals filed for this visit.                  Wound Therapy - 09/02/16 1023    Subjective Pt states that she has no arterial blockage.  She is not on any antibiotic.  Pt has been using her own compression hose which do not appear to have the right compression pt states that she has noticed that she has increased swelling in her LE.  Pt states that she went and puchased the compression hose that the therapist requested but she can not get them on.  Therapist encouraged pt to bring garment next treatment so we can assist in [place] donning.    Patient and Family Stated Goals For the wound to heal    Date of Onset 05/06/16   Prior Treatments antibiotics, self-dressing with xeroform/gauze    Pain Assessment 0-10  Rt leg only has pain with debridement.     Pain Score 7    Pain  Type Chronic pain   Pain Location Hip   Pain Orientation Left   Pain Descriptors / Indicators Shooting;Sore   Pain Onset With Activity   Patients Stated Pain Goal 0   Pain Intervention(s) Medication (See eMAR)   Multiple Pain Sites No   Evaluation and Treatment Procedures Explained to Patient/Family Yes   Evaluation and Treatment Procedures agreed to   Wound Properties Date First Assessed: 08/02/16 Time First Assessed: 1125 Wound Type: Venous stasis ulcer Location: Leg Location Orientation: Right;Lower Wound Description (Comments): R lateral lower LE  Present on Admission: Yes   Dressing Type Gauze (Comment)  medihoney   Dressing Changed Changed   Dressing Status Clean;Dry;Intact   Dressing Change Frequency PRN   Site / Wound Assessment Clean;Dry   % Wound base Red or Granulating 30%  Was 10%   % Wound base Yellow/Fibrinous Exudate 70%  Was 90%   Peri-wound Assessment Edema;Hemosiderin   Wound Length (cm) 4 cm  wound continues to get larger was 2.5   Wound Width (cm) 3.2 cm was 2.0   Wound Depth (cm) 0.2 cm was .4   Margins Attached edges (approximated)   Closure None   Drainage Amount None  Drainage Description No odor   Treatment Cleansed;Debridement (Selective);Other (Comment)  manual to decrease swelling prior to profore lite    Selective Debridement - Location wound bed    Selective Debridement - Tools Used Forceps;Scalpel   Selective Debridement - Tissue Removed slough   Wound Therapy - Clinical Statement Pt arteries are not clogged but wound contines to increase in size.  Therapist sent order to [facility] for culture of wound .    Wound Therapy - Functional Problem List difficulty walking and standing due to pain    Factors Delaying/Impairing Wound Healing Infection - systemic/local;Vascular compromise   Hydrotherapy Plan Debridement;Dressing change;Patient/family education  manual to decrease swelling when needed    Wound Therapy - Frequency --  2x week x 4 weeks :  pt will need to be continued for another four week or until wound is getting smaller and the patient feels comfortable with self care.    Wound Therapy - Current Recommendations PT   Wound Plan debridement, manual and dressing change using appropriate bandaging conducive to wound environment.   Dressing  medihoney,4x4and profore lite.   Manual Therapy Decongestive techniques completed to decrease swelling of Rt LE prior to application of profore lite                  PT Education - 09/02/16 1032    Education provided Yes   Education Details to bring compression stockings in to allow [place] to show various ways to don garment           PT Short Term Goals - 08/05/16 1521      PT [place] #1   Title Patient to experience pain no more than 5/10 to allow pt to be standing/walking  for 30 mintues in comfort to tolerate job activities.    Time 2   Period Weeks   Status On-going     PT SHORT TERM GOAL #2   Title Wound granulationto have increased by at least 50% to decrease risk of infection.   Time 2   Period Weeks   Status On-going     PT SHORT TERM GOAL #3   Title Pt drainage to be minimal to decrease risk of infection    Time 2   Period Weeks   Status On-going           PT Long Term Goals - 08/05/16 1522      PT LONG TERM GOAL #1   Title  Pain 0/10 to allow pt to be up walking/standing for 2 hours without pain to be able to complete her work activity in comfort.    Time 4   Period Weeks   Status On-going     PT LONG TERM GOAL #2   Title Pt wound to be 100% granulated for reduced risk of infection    Time 4   Period Weeks   Status On-going     PT LONG TERM GOAL #3   Title Wound to have decreased in size to no greater than 1x.5x .5  for pt to feel comfortable with self treatment.    Time 4   Period Weeks   Status On-going               Plan - 09/02/16 1033    Clinical Impression Statement see above    Rehab Potential Fair   PT Frequency  2x / week   PT Duration 4 weeks   PT Treatment/Interventions ADLs/Self Care Home Management;Other (comment)  debridement as tolerated    PT Next Visit Plan complete culture if order has returned.  Show pt various donning devices for compression garments.       Patient will benefit from skilled therapeutic intervention in order to improve the following deficits and impairments:  Pain  Visit Diagnosis: Pain in right lower leg  Difficulty in walking, not elsewhere classified  Varicose veins of right lower extremity with ulcer of ankle [facility])     Problem List Patient Active Problem List   Diagnosis Date Noted  . PVD (peripheral vascular disease) ([place]) 07/05/2016  . Venous stasis ulcer ([place]) 03/15/2016  . Cellulitis 12/08/2015  . (HFpEF) heart failure with preserved ejection fraction ([place]) 04/16/2013  . Mitral regurgitation   . Anemia   . HTN (hypertension)   . Atrial fibrillation ([place]) 04/02/2009  . DIASTOLIC HEART FAILURE, CHRONIC 04/02/2009    Rayetta Humphrey, PT CLT [phone] 09/02/2016, 10:34 AM  [facility] [address] [place], [place], [address] Phone: [phone]   Fax:  [phone]  Name: SHELBE HAGLUND MRN: [id number] Date of Birth: Dec 04, 1927

## 2016-09-05 ENCOUNTER — Ambulatory Visit (INDEPENDENT_AMBULATORY_CARE_PROVIDER_SITE_OTHER): Payer: Medicare Other | Admitting: Family Medicine

## 2016-09-05 ENCOUNTER — Encounter: Payer: Self-pay | Admitting: Family Medicine

## 2016-09-05 VITALS — BP 132/74 | HR 66 | Temp 97.4°F | Ht 62.0 in | Wt 111.6 lb

## 2016-09-05 DIAGNOSIS — M79652 Pain in left thigh: Secondary | ICD-10-CM

## 2016-09-05 MED ORDER — METHYLPREDNISOLONE ACETATE 80 MG/ML IJ SUSP
80.0000 mg | Freq: Once | INTRAMUSCULAR | Status: AC
  Administered 2016-09-05: 80 mg via INTRAMUSCULAR

## 2016-09-05 NOTE — Patient Instructions (Signed)
Great to see you!  Be sure to let wound care know that you have had a steroid injection.   Be sure to see your surgeon as you are planning.

## 2016-09-05 NOTE — Addendum Note (Signed)
Addended by: Nigel Berthold C on: 09/05/2016 10:24 AM   Modules accepted: Orders

## 2016-09-05 NOTE — Progress Notes (Signed)
   HPI  Patient presents today here with left thigh pain.  Patient explains that she's had a few weeks of left lateral thigh pain. She denies any injury. She states that she was seen by orthopedics who recommended Tylenol for 6 weeks then return. She does not want to go back there. She plans to see her previous orthopedic provider and even.  Patient requesting "hip injection". However she does not have groin pain or pain over the greater trochanter. Pain is described as left lateral mid thigh pain. It hurts worse with inactivity gets better as she walks.   PMH: Smoking status noted ROS: Per HPI  Objective: BP 132/74   Pulse (!) 110   Temp 97.4 F (36.3 C) (Oral)   Ht 5\' 2"  (1.575 m)   Wt 111 lb 9.6 oz (50.6 kg)   BMI 20.41 kg/m  Gen: NAD, alert, cooperative with exam HEENT: NCAT CV: RRR, good S1/S2, no murmur Resp: CTABL, no wheezes, non-labored Ext: No edema, warm Neuro: Alert and oriented, No gross deficits MSK:  Limited ROM on L hip, no pain with FABER, no pain with palp over the greater troch  Assessment and plan:  # Left side pain. Unclear etiology, pain improves with activity, otherwise IT band syndrome would be my first consideration I agree with the patient that she should follow-up with her previous orthopedic surgeon. We discussed that in general intra-articular injection of the hip is only done by sports medicine and orthopedic surgeons unless primary care has received specialized training. She does not warrant an intra-articular injection or a greater trochanter bursa injection on my exam. After discussion she would like an intramuscular steroid injection.     Laroy Apple, MD [facility] 09/05/2016, 10:07 AM

## 2016-09-08 ENCOUNTER — Other Ambulatory Visit (HOSPITAL_COMMUNITY)
Admission: RE | Admit: 2016-09-08 | Discharge: 2016-09-08 | Disposition: A | Discharge status: Home / Self Care | Payer: Medicare Other | Source: Other Acute Inpatient Hospital | Attending: Family | Admitting: Family

## 2016-09-08 ENCOUNTER — Ambulatory Visit (HOSPITAL_COMMUNITY): Payer: Medicare Other | Admitting: Physical Therapy

## 2016-09-08 DIAGNOSIS — M79661 Pain in right lower leg: Secondary | ICD-10-CM | POA: Diagnosis not present

## 2016-09-08 DIAGNOSIS — R262 Difficulty in walking, not elsewhere classified: Secondary | ICD-10-CM | POA: Diagnosis not present

## 2016-09-08 DIAGNOSIS — S91001A Unspecified open wound, right ankle, initial encounter: Secondary | ICD-10-CM | POA: Diagnosis not present

## 2016-09-08 DIAGNOSIS — I83013 Varicose veins of right lower extremity with ulcer of ankle: Secondary | ICD-10-CM | POA: Diagnosis not present

## 2016-09-08 DIAGNOSIS — X58XXXA Exposure to other specified factors, initial encounter: Secondary | ICD-10-CM | POA: Diagnosis not present

## 2016-09-08 DIAGNOSIS — M25571 Pain in right ankle and joints of right foot: Secondary | ICD-10-CM | POA: Diagnosis not present

## 2016-09-08 DIAGNOSIS — L97319 Non-pressure chronic ulcer of right ankle with unspecified severity: Secondary | ICD-10-CM | POA: Diagnosis not present

## 2016-09-08 DIAGNOSIS — L97311 Non-pressure chronic ulcer of right ankle limited to breakdown of skin: Secondary | ICD-10-CM | POA: Diagnosis not present

## 2016-09-08 NOTE — Therapy (Signed)
[facility] [address] [place], [place], [address] Phone: [phone]   Fax:  [phone]  Wound Care Therapy  Patient Details  Name: Jody [name] MRN: [id number] Date of Birth: 10-25-1927 Referring Provider: Glory Buff   Encounter Date: 09/08/2016      PT End of Session - 09/08/16 1116    Visit Number 6   Number of Visits 8   Date for PT Re-Evaluation 09/01/16   Authorization Type United healthcare medicare   Authorization - Visit Number 6   Authorization - Number of Visits 8   PT Start Time 1025   PT Stop Time 1100   PT Time Calculation (min) 35 min   Activity Tolerance Patient limited by pain   Behavior During Therapy [facility] for tasks assessed/performed      Past Medical History:  Diagnosis Date  . Afib ([place])   . Anemia   . CAD (coronary artery disease)   . Chronic diastolic heart failure ([place])   . HTN (hypertension)   . Mitral regurgitation    Moderate, echo, 07/2012  . Pulmonary hypertension ([place])     Past Surgical History:  Procedure Laterality Date  . JOINT REPLACEMENT Right Feb 2015   Dr. Case- Ledell Noss  . TOTAL KNEE ARTHROPLASTY Left     There were no vitals filed for this visit.                  Wound Therapy - 09/08/16 1102    Subjective order received to complete wound culture.  Pt states that she has no arterial blockage.  She is not on any antibiotic.  Pt has been using her own compression hose which do not appear to have the right compression pt states that she has noticed that she has increased swelling in her LE.  Pt states that she went and puchased the compression hose that the therapist requested but she can not get them on.  Pt forgot to bring her hose today.   Patient and Family Stated Goals For the wound to heal    Date of Onset 05/06/16   Prior Treatments antibiotics, self-dressing with xeroform/gauze    Pain Assessment 0-10   Pain Score 7    Pain Type Chronic pain   Pain  Location Hip   Pain Orientation Left   Pain Descriptors / Indicators Shooting;Sore   Pain Onset With Activity   Patients Stated Pain Goal 0   Pain Intervention(s) Medication (See eMAR)   Multiple Pain Sites No   Evaluation and Treatment Procedures Explained to Patient/Family Yes   Evaluation and Treatment Procedures agreed to   Wound Properties Date First Assessed: 08/02/16 Time First Assessed: 1125 Wound Type: Venous stasis ulcer Location: Leg Location Orientation: Right;Lower Wound Description (Comments): R lateral lower LE  Present on Admission: Yes   Dressing Type Gauze (Comment)  medihoney   Dressing Changed Changed   Dressing Status Clean;Dry;Intact   Dressing Change Frequency PRN   Site / Wound Assessment Clean;Dry   % Wound base Red or Granulating 30%   % Wound base Yellow/Fibrinous Exudate 70%   Peri-wound Assessment Edema;Hemosiderin   Wound Length (cm) 5.5 cm   Wound Width (cm) 2.5 cm   Wound Depth (cm) 0.2 cm   Margins Attached edges (approximated)   Closure None   Drainage Amount None   Drainage Description No odor   Treatment Cleansed;Debridement (Selective)   Selective Debridement - Location wound bed    Selective Debridement - Tools  Used Forceps;Scalpel   Selective Debridement - Tissue Removed slough   Wound Therapy - Clinical Statement Pt reported removing her profore lite yesterday due to discomfort on her wound.  STates she is hurting into her ankle (lateral malleolus) and is so sensitive to touch.  Wound remeasured and appears to be expanding up and down LE.  Line drawn around red perimeter.  Pt would only allow minimal debridement of wound.  Culture obtained after cleansing wound.  Dressed with silver alginate and reapplied dressing of profore lite.  Encouraged to leave on as long as possible to help wtih the swelling.   Wound Therapy - Functional Problem List difficulty walking and standing due to pain    Factors Delaying/Impairing Wound Healing Infection -  systemic/local;Vascular compromise   Hydrotherapy Plan Debridement;Dressing change;Patient/family education  manual to decrease swelling when needed    Wound Therapy - Frequency --  2x week x 4 weeks    Wound Therapy - Current Recommendations PT   Wound Plan debridement, manual and dressing change using appropriate bandaging conducive to wound environment..  Check on results of wound culture next session.   Dressing  silver hydrofiber,4x4and profore lite.   Manual Therapy --                   PT Short Term Goals - 08/05/16 1521      PT SHORT TERM GOAL #1   Title Patient to experience pain no more than 5/10 to allow pt to be standing/walking  for 30 mintues in comfort to tolerate job activities.    Time 2   Period Weeks   Status On-going     PT SHORT TERM GOAL #2   Title Wound granulationto have increased by at least 50% to decrease risk of infection.   Time 2   Period Weeks   Status On-going     PT SHORT TERM GOAL #3   Title Pt drainage to be minimal to decrease risk of infection    Time 2   Period Weeks   Status On-going           PT Long Term Goals - 08/05/16 1522      PT LONG TERM GOAL #1   Title  Pain 0/10 to allow pt to be up walking/standing for 2 hours without pain to be able to complete her work activity in comfort.    Time 4   Period Weeks   Status On-going     PT LONG TERM GOAL #2   Title Pt wound to be 100% granulated for reduced risk of infection    Time 4   Period Weeks   Status On-going     PT LONG TERM GOAL #3   Title Wound to have decreased in size to no greater than 1x.5x .5  for pt to feel comfortable with self treatment.    Time 4   Period Weeks   Status On-going             Patient will benefit from skilled therapeutic intervention in order to improve the following deficits and impairments:     Visit Diagnosis: Pain in right lower leg  Difficulty in walking, not elsewhere classified     Problem List Patient  Active Problem List   Diagnosis Date Noted  . PVD (peripheral vascular disease) ([place]) 07/05/2016  . Venous stasis ulcer ([place]) 03/15/2016  . Cellulitis 12/08/2015  . (HFpEF) heart failure with preserved ejection fraction ([place]) 04/16/2013  . Mitral regurgitation   .  Anemia   . HTN (hypertension)   . Atrial fibrillation ([place]) 04/02/2009  . DIASTOLIC HEART FAILURE, CHRONIC 04/02/2009    Teena Irani, PTA/CLT [phone]  09/08/2016, 11:19 AM  [facility] [address] [place], [place], [address] Phone: [phone]   Fax:  [phone]  Name: CAIT LOCUST MRN: [id number] Date of Birth: 1928/01/09

## 2016-09-12 ENCOUNTER — Ambulatory Visit (HOSPITAL_COMMUNITY): Payer: Medicare Other | Admitting: Physical Therapy

## 2016-09-12 ENCOUNTER — Encounter: Payer: Self-pay | Admitting: Family

## 2016-09-12 ENCOUNTER — Ambulatory Visit (INDEPENDENT_AMBULATORY_CARE_PROVIDER_SITE_OTHER): Payer: Medicare Other | Admitting: Family

## 2016-09-12 VITALS — BP 148/78 | HR 74 | Temp 97.1°F | Ht 62.0 in | Wt 115.2 lb

## 2016-09-12 DIAGNOSIS — R262 Difficulty in walking, not elsewhere classified: Secondary | ICD-10-CM | POA: Diagnosis not present

## 2016-09-12 DIAGNOSIS — I83013 Varicose veins of right lower extremity with ulcer of ankle: Secondary | ICD-10-CM

## 2016-09-12 DIAGNOSIS — L97311 Non-pressure chronic ulcer of right ankle limited to breakdown of skin: Secondary | ICD-10-CM | POA: Diagnosis not present

## 2016-09-12 DIAGNOSIS — I83018 Varicose veins of right lower extremity with ulcer other part of lower leg: Secondary | ICD-10-CM

## 2016-09-12 DIAGNOSIS — L97319 Non-pressure chronic ulcer of right ankle with unspecified severity: Secondary | ICD-10-CM | POA: Diagnosis not present

## 2016-09-12 DIAGNOSIS — M25571 Pain in right ankle and joints of right foot: Secondary | ICD-10-CM

## 2016-09-12 DIAGNOSIS — L97811 Non-pressure chronic ulcer of other part of right lower leg limited to breakdown of skin: Secondary | ICD-10-CM | POA: Diagnosis not present

## 2016-09-12 DIAGNOSIS — M79661 Pain in right lower leg: Secondary | ICD-10-CM

## 2016-09-12 LAB — AEROBIC CULTURE  (SUPERFICIAL SPECIMEN)

## 2016-09-12 LAB — AEROBIC CULTURE W GRAM STAIN (SUPERFICIAL SPECIMEN)

## 2016-09-12 MED ORDER — CIPROFLOXACIN HCL 500 MG PO TABS: 500.0000 mg | ORAL_TABLET | Freq: Two times a day (BID) | ORAL | 0 refills | Status: DC

## 2016-09-12 NOTE — Therapy (Signed)
[facility], [place], [address] Phone: [phone]   Fax:  [phone]  Wound Care Therapy  Patient Details  Name: Jody [name] MRN: [id number] Date of Birth: 01-24-1928 Referring Provider: Glory Buff   Encounter Date: 09/12/2016      PT End of Session - 09/12/16 0940    Visit Number 7   Number of Visits 8   Date for PT Re-Evaluation 09/01/16   Authorization Type United healthcare medicare   Authorization - Visit Number 7   Authorization - Number of Visits 8   PT Start Time 0900   PT Stop Time 0925   PT Time Calculation (min) 25 min   Activity Tolerance Patient limited by pain   Behavior During Therapy [facility] for tasks assessed/performed      Past Medical History:  Diagnosis Date  . Afib ([place])   . Anemia   . CAD (coronary artery disease)   . Chronic diastolic heart failure ([place])   . HTN (hypertension)   . Mitral regurgitation    Moderate, echo, 07/2012  . Pulmonary hypertension ([place])     Past Surgical History:  Procedure Laterality Date  . JOINT REPLACEMENT Right Feb 2015   Dr. Case- Ledell Noss  . TOTAL KNEE ARTHROPLASTY Left     There were no vitals filed for this visit.                  Wound Therapy - 09/12/16 0933    Subjective Pt reports she has an appt with Dr. Lenna Gilford this afternoon. Culture report is now in epic with hopes of correct antibiotic or other intervention.  Pt reports her leg is killing her with soreness/pain lateral malleoli.  Noted antalgic gait.    Patient and Family Stated Goals For the wound to heal    Date of Onset 05/06/16   Prior Treatments antibiotics, self-dressing with xeroform/gauze    Pain Assessment 0-10   Pain Score 8    Pain Type Chronic pain   Pain Location Leg   Pain Orientation Right   Pain Descriptors / Indicators Aching;Jabbing   Pain Onset On-going   Patients Stated Pain Goal 0   Pain Intervention(s) Medication (See eMAR)  currently she is  out of pain meds   Multiple Pain Sites No   Evaluation and Treatment Procedures Explained to Patient/Family Yes   Evaluation and Treatment Procedures agreed to   Wound Properties Date First Assessed: 08/02/16 Time First Assessed: 1125 Wound Type: Venous stasis ulcer Location: Leg Location Orientation: Right;Lower Wound Description (Comments): R lateral lower LE  Present on Admission: Yes   Dressing Type Gauze (Comment)  silver   Dressing Changed Changed   Dressing Status Clean;Dry;Intact   Dressing Change Frequency PRN   Site / Wound Assessment Clean;Dry   % Wound base Red or Granulating 30%   % Wound base Yellow/Fibrinous Exudate 70%   Peri-wound Assessment Edema;Hemosiderin   Wound Length (cm) 10 cm   Wound Width (cm) 6 cm   Margins Attached edges (approximated)   Closure None   Drainage Amount Moderate   Drainage Description Purulent;Serosanguineous   Treatment Cleansed;Debridement (Selective)   Selective Debridement - Location wound bed    Selective Debridement - Tools Used Forceps;Scalpel   Selective Debridement - Tissue Removed slough   Wound Therapy - Clinical Statement pt with dressing intact this session.  Reports increased pain from last session with noted antalgia.  Overall worsening of wound since last session with moderate  pseudomonas (greenish) drainage and doubling in size.  Raw all around perimeter with noted overall irritation and erythema.  Pt with MD appt this afternoon.   Wound Therapy - Functional Problem List difficulty walking and standing due to pain    Factors Delaying/Impairing Wound Healing Infection - systemic/local;Vascular compromise   Hydrotherapy Plan Debridement;Dressing change;Patient/family education  manual to decrease swelling when needed    Wound Therapy - Frequency --  2x week x 4 weeks    Wound Therapy - Current Recommendations PT   Wound Plan debridement, manual and dressing change using appropriate bandaging conducive to wound environment..   follow up with MD appt and ensure further interventions (i.e antibiotic, consults, etc).     Dressing  silver hydrofiber,4x4and profore lite.                   PT Short Term Goals - 08/05/16 1521      PT SHORT TERM GOAL #1   Title Patient to experience pain no more than 5/10 to allow pt to be standing/walking  for 30 mintues in comfort to tolerate job activities.    Time 2   Period Weeks   Status On-going     PT SHORT TERM GOAL #2   Title Wound granulationto have increased by at least 50% to decrease risk of infection.   Time 2   Period Weeks   Status On-going     PT SHORT TERM GOAL #3   Title Pt drainage to be minimal to decrease risk of infection    Time 2   Period Weeks   Status On-going           PT Long Term Goals - 08/05/16 1522      PT LONG TERM GOAL #1   Title  Pain 0/10 to allow pt to be up walking/standing for 2 hours without pain to be able to complete her work activity in comfort.    Time 4   Period Weeks   Status On-going     PT LONG TERM GOAL #2   Title Pt wound to be 100% granulated for reduced risk of infection    Time 4   Period Weeks   Status On-going     PT LONG TERM GOAL #3   Title Wound to have decreased in size to no greater than 1x.5x .5  for pt to feel comfortable with self treatment.    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 PVD (peripheral vascular disease) ([employer]) 07/05/2016  . Venous stasis ulcer ([place]) 03/15/2016  .  Cellulitis 12/08/2015  . (HFpEF) heart failure with preserved ejection fraction ([place]) 04/16/2013  . Mitral regurgitation   . Anemia   . HTN (hypertension)   . Atrial fibrillation ([place]) 04/02/2009  . DIASTOLIC HEART FAILURE, CHRONIC 04/02/2009    Teena Irani, PTA/CLT [phone]  09/12/2016, 9:41 AM  [facility]  [address] [place], [place], [address] Phone: [phone]   Fax:  [phone]  Name: LILLITH MCNEFF MRN: [id number] Date of Birth: September 09, 1927

## 2016-09-12 NOTE — Addendum Note (Signed)
Addended by: Leeroy Cha on: 09/12/2016 12:07 PM   Modules accepted: Orders

## 2016-09-12 NOTE — Progress Notes (Signed)
   Subjective:    Patient ID: Jody [name], female    DOB: May 22, 1927, 83 y.o.   MRN: [id number]  HPI PT presents to the office to recheck wound of venous stasis ulcer on right lower leg. Pt has wound culture that was positive. We will start pt on cipro today as that is sensitive. Pt states she has constant aching pain of 10 out 10. PT is followed by wound care weekly. PT followed up with vascular and was told to elevate her feet. PT is taking Ultram as needed that helps, but night time her ankle hurts worse.    Review of Systems  Skin: Positive for wound.  All other systems reviewed and are negative.      Objective:   Physical Exam  Constitutional: She is oriented to person, place, and time. She appears well-developed and well-nourished. No distress.  HENT:  Head: Normocephalic.  Cardiovascular: Normal rate, regular rhythm and intact distal pulses.   Murmur heard. Pulmonary/Chest: Effort normal and breath sounds normal. No respiratory distress. She has no wheezes.  Musculoskeletal: Normal range of motion. She exhibits no edema or tenderness.  Neurological: She is alert and oriented to person, place, and time.  Skin: Skin is warm and dry.  Wound on right ankle is bandaged, Pt saw wound care this morning. Will hold off on unbandaging and start pt on antibiotics today.   Psychiatric: She has a normal mood and affect. Her behavior is normal. Judgment and thought content normal.  Vitals reviewed.     BP (!) 143/80   Pulse 90   Temp 97.1 F (36.2 C) (Oral)   Ht _0  (1.575 m)   Wt 115 lb 3.2 oz (52.3 kg)   BMI 21.07 kg/m      Assessment & Plan:  1. Venous stasis ulcer of other part of right lower leg limited to breakdown of skin, unspecified whether varicose veins present ([place]) Pt started on Cipro, pt has levaquin on her allergy list. States that caused her to be "weak". Discussed to start cipro and call office if she is having a reaction. Labs pending Keep feet  elevated Keep Wound care appt RTO in 1 week - BMP8+EGFR - ciprofloxacin (CIPRO) 500 MG tablet; Take 1 tablet (500 mg total) by mouth 2 (two) times daily.  Dispense: 20 tablet; Refill: 0   Evelina Dun, FNP

## 2016-09-12 NOTE — Patient Instructions (Signed)
Venous Ulcer A venous ulcer is a shallow sore on your lower leg that is caused by poor circulation in your veins. This condition used to be called stasis ulcer. Veins have valves that help return blood to the heart. If these valves do not work properly, it can cause blood to flow backward and to back up into the veins near the skin. When that happens, blood can pool in your lower legs. The blood can then leak out of your veins, which can irritate your skin. This may cause a break in your skin that becomes a venous ulcer. Venous ulcer is the most common type of lower leg ulcer. You may have venous ulcers on one leg or on both legs. The area where this condition most commonly develops is around the ankles. A venous ulcer may last for a long time (chronic ulcer) or it may return repeatedly (recurrent ulcer). What are the causes? Any condition that causes poor circulation to your legs can lead to a venous ulcer. What increases the risk? This condition is more likely to develop in:  People who are 65 years of age or older.  People who are overweight.  People who are not active.  People who have had a leg ulcer in the past.  People who have clots in their lower leg veins (deep vein thrombosis).  People who have inflammation of their leg veins (phlebitis).  Women who have given birth.  People who smoke.  What are the signs or symptoms? The most common symptom of this condition is an open sore near your ankle. Other symptoms may include:  Swelling.  Thickening of the skin.  Fluid leaking from the ulcer.  Bleeding.  Itching.  Pain and swelling that gets worse when you stand up and feels better when you raise your leg.  Blotchy skin.  Darkening of the skin.  How is this diagnosed? Your health care provider may suspect a venous ulcer based on your medical history and your risk factors. Your health care provider will check the skin on your legs. Other tests may be done to learn more  about the ulcer and to determine the best way to treat it. Tests that may be done include:  Measuring the blood pressure in your arms and legs.  Using sound waves (ultrasound) to measure the blood flow in your leg veins.  How is this treated? You may need to try several different types of treatment to get your venous ulcer to heal. Healing may take a long time. Treatment may include:  Keeping your leg raised (elevated).  Wearing a type of bandage or stocking to compress the veins of your leg (compression therapy). Venous wounds are not likely to heal or to stay healed without compression.  Taking medicines to improve blood flow.  Taking antibiotic medicines to treat infection.  Cleaning your ulcer and removing any dead tissue from the wound (debridement).  Placing various types of medicated bandage (dressings) or medicated wraps on your ulcer. This helps the ulcer to heal and helps to prevent infection.  Surgery is sometimes needed to close the wound using a piece of skin taken from another area of your body (graft). You may need surgery if other treatments are not working or if your ulcer is very deep. Follow these instructions at home: Wound care  Follow instructions from your health care provider about: ? How to take care of your wound. ? When and how you should change your bandage (dressing). ? When you should   remove your dressing. If your dressing is dry and sticks to your leg when you try to remove it, moisten or wet the dressing with saline solution or water so that the dressing can be removed without harming your skin or wound tissue.  Check your wound every day for signs of infection. Have a caregiver do this for you if you are not able to do it yourself. Check for: ? More redness, swelling, or pain. ? More fluid or blood. ? Pus, warmth, or a bad smell. Medicines  Take over-the-counter and prescription medicines only as told by your health care provider.  If you were  prescribed an antibiotic medicine, take it or apply it as told by your health care provider. Do not stop taking or using the antibiotic even if your condition improves. Activity  Do not stand or sit in one position for a long period of time. Rest with your legs raised during the day. If possible, keep your legs above your heart for 30 minutes, 3-4 times a day, or as told by your health care provider.  Do not sit with your legs crossed.  Walk often to increase the blood flow in your legs.Ask your health care provider what level of activity is safe for you.  If you are taking a long ride in a car or plane, take a break to walk around at least once every two hours, or as often as your health care provider recommends. Ask your health care provider if you should take aspirin before long trips. General instructions   Wear elastic stockings, compression stockings, or support hose as told by your health care provider. This is very important.  Raise the foot of your bed as told by your health care provider.  Do not smoke.  Keep all follow-up visits as told by your health care provider. This is important. Contact a health care provider if:  You have a fever.  Your ulcer is getting larger or is not healing.  Your pain gets worse.  You have more redness or swelling around your ulcer.  You have more fluid, blood, or pus coming from your ulcer after it has been cleaned by you or your health care provider.  You have warmth or a bad smell coming from your ulcer. This information is not intended to replace advice given to you by your health care provider. Make sure you discuss any questions you have with your health care provider. Document Released: 12/14/2000 Document Revised: 08/27/2015 Document Reviewed: 07/30/2014 Elsevier Interactive Patient Education  2018 Elsevier Inc.  

## 2016-09-13 ENCOUNTER — Telehealth: Payer: Self-pay | Admitting: Family Medicine

## 2016-09-13 ENCOUNTER — Other Ambulatory Visit: Payer: Self-pay | Admitting: Family

## 2016-09-13 ENCOUNTER — Telehealth: Payer: Self-pay | Admitting: Family

## 2016-09-13 DIAGNOSIS — I83018 Varicose veins of right lower extremity with ulcer other part of lower leg: Secondary | ICD-10-CM

## 2016-09-13 DIAGNOSIS — A498 Other bacterial infections of unspecified site: Secondary | ICD-10-CM

## 2016-09-13 DIAGNOSIS — L97811 Non-pressure chronic ulcer of other part of right lower leg limited to breakdown of skin: Principal | ICD-10-CM

## 2016-09-13 LAB — BMP8+EGFR
BUN/Creatinine Ratio: 19 (ref 12–28)
BUN: 25 mg/dL (ref 8–27)
CALCIUM: 8.9 mg/dL (ref 8.7–10.3)
CHLORIDE: 102 mmol/L (ref 96–106)
CO2: 26 mmol/L (ref 20–29)
Creatinine, Ser: 1.33 mg/dL — ABNORMAL HIGH (ref 0.57–1.00)
GFR calc non Af Amer: 36 mL/min/{1.73_m2} — ABNORMAL LOW (ref >59)
GFR, EST AFRICAN AMERICAN: 41 mL/min/{1.73_m2} — AB (ref >59)
Glucose: 96 mg/dL (ref 65–99)
Potassium: 5.1 mmol/L (ref 3.5–5.2)
Sodium: 140 mmol/L (ref 134–144)

## 2016-09-13 NOTE — Telephone Encounter (Signed)
I called and spoke to pt's daughter

## 2016-09-13 NOTE — Progress Notes (Signed)
Pt could not tolerate cipro. Pt set up at [facility] to receive Cefepime 2 g every 12 hours for 10 days for Pseudomonas aeruginosa and Klebsiella oxytoca.

## 2016-09-13 NOTE — Telephone Encounter (Signed)
lmtcb jkp 6/12

## 2016-09-13 NOTE — Telephone Encounter (Signed)
Provider has addressed.

## 2016-09-14 ENCOUNTER — Encounter (HOSPITAL_COMMUNITY): Payer: Medicare Other

## 2016-09-14 ENCOUNTER — Encounter (HOSPITAL_COMMUNITY): Payer: Self-pay

## 2016-09-14 ENCOUNTER — Encounter (HOSPITAL_COMMUNITY)
Admission: RE | Admit: 2016-09-14 | Discharge: 2016-09-14 | Disposition: A | Discharge status: Home / Self Care | Payer: Medicare Other | Source: Ambulatory Visit | Attending: Family | Admitting: Family

## 2016-09-14 DIAGNOSIS — B965 Pseudomonas (aeruginosa) (mallei) (pseudomallei) as the cause of diseases classified elsewhere: Secondary | ICD-10-CM | POA: Insufficient documentation

## 2016-09-14 DIAGNOSIS — L97919 Non-pressure chronic ulcer of unspecified part of right lower leg with unspecified severity: Secondary | ICD-10-CM | POA: Insufficient documentation

## 2016-09-14 MED ORDER — CEFEPIME HCL 2 G IJ SOLR
2.0000 g | Freq: Once | INTRAMUSCULAR | Status: AC
  Administered 2016-09-14: 2 g via INTRAVENOUS
  Filled 2016-09-14: qty 2

## 2016-09-14 MED ORDER — SODIUM CHLORIDE 0.9 % IV SOLN
Freq: Once | INTRAVENOUS | Status: AC
  Administered 2016-09-14: 250 mL via INTRAVENOUS

## 2016-09-15 ENCOUNTER — Encounter (HOSPITAL_COMMUNITY): Payer: Medicare Other

## 2016-09-15 ENCOUNTER — Encounter (HOSPITAL_COMMUNITY)
Admission: RE | Admit: 2016-09-15 | Discharge: 2016-09-15 | Disposition: A | Discharge status: Home / Self Care | Payer: Medicare Other | Source: Ambulatory Visit | Attending: Family | Admitting: Family

## 2016-09-15 DIAGNOSIS — L97919 Non-pressure chronic ulcer of unspecified part of right lower leg with unspecified severity: Secondary | ICD-10-CM | POA: Diagnosis not present

## 2016-09-15 DIAGNOSIS — B965 Pseudomonas (aeruginosa) (mallei) (pseudomallei) as the cause of diseases classified elsewhere: Secondary | ICD-10-CM | POA: Diagnosis not present

## 2016-09-15 MED ORDER — SODIUM CHLORIDE 0.9 % IV SOLN
Freq: Once | INTRAVENOUS | Status: AC
  Administered 2016-09-15: 250 mL via INTRAVENOUS

## 2016-09-15 MED ORDER — DEXTROSE 5 % IV SOLN
2.0000 g | Freq: Once | INTRAVENOUS | Status: AC
  Administered 2016-09-15: 2 g via INTRAVENOUS
  Filled 2016-09-15: qty 2

## 2016-09-16 ENCOUNTER — Encounter (HOSPITAL_COMMUNITY)
Admission: RE | Admit: 2016-09-16 | Discharge: 2016-09-16 | Disposition: A | Discharge status: Home / Self Care | Payer: Medicare Other | Source: Ambulatory Visit | Attending: Family | Admitting: Family

## 2016-09-16 ENCOUNTER — Encounter (HOSPITAL_COMMUNITY): Payer: Medicare Other

## 2016-09-16 DIAGNOSIS — B965 Pseudomonas (aeruginosa) (mallei) (pseudomallei) as the cause of diseases classified elsewhere: Secondary | ICD-10-CM | POA: Diagnosis not present

## 2016-09-16 DIAGNOSIS — L97919 Non-pressure chronic ulcer of unspecified part of right lower leg with unspecified severity: Secondary | ICD-10-CM | POA: Diagnosis not present

## 2016-09-16 MED ORDER — SODIUM CHLORIDE 0.9 % IV SOLN
INTRAVENOUS | Status: DC
  Administered 2016-09-16: 250 mL via INTRAVENOUS

## 2016-09-16 MED ORDER — DEXTROSE 5 % IV SOLN
2.0000 g | Freq: Once | INTRAVENOUS | Status: AC
  Administered 2016-09-16: 2 g via INTRAVENOUS
  Filled 2016-09-16: qty 2

## 2016-09-17 ENCOUNTER — Encounter (HOSPITAL_COMMUNITY): Payer: Medicare Other

## 2016-09-17 DIAGNOSIS — L97919 Non-pressure chronic ulcer of unspecified part of right lower leg with unspecified severity: Secondary | ICD-10-CM | POA: Diagnosis not present

## 2016-09-17 DIAGNOSIS — B965 Pseudomonas (aeruginosa) (mallei) (pseudomallei) as the cause of diseases classified elsewhere: Secondary | ICD-10-CM | POA: Diagnosis not present

## 2016-09-17 MED ORDER — SODIUM CHLORIDE 0.9 % IV SOLN
INTRAVENOUS | Status: DC
  Administered 2016-09-17: 10:00:00 via INTRAVENOUS

## 2016-09-17 MED ORDER — DEXTROSE 5 % IV SOLN
2.0000 g | Freq: Once | INTRAVENOUS | Status: AC
  Administered 2016-09-17: 2 g via INTRAVENOUS

## 2016-09-18 ENCOUNTER — Encounter (HOSPITAL_COMMUNITY): Payer: Medicare Other

## 2016-09-18 DIAGNOSIS — L97919 Non-pressure chronic ulcer of unspecified part of right lower leg with unspecified severity: Secondary | ICD-10-CM | POA: Diagnosis not present

## 2016-09-18 DIAGNOSIS — B965 Pseudomonas (aeruginosa) (mallei) (pseudomallei) as the cause of diseases classified elsewhere: Secondary | ICD-10-CM | POA: Diagnosis not present

## 2016-09-18 MED ORDER — DEXTROSE 5 % IV SOLN
2.0000 g | Freq: Once | INTRAVENOUS | Status: AC
  Administered 2016-09-18: 2 g via INTRAVENOUS

## 2016-09-18 MED ORDER — SODIUM CHLORIDE 0.9 % IV SOLN
INTRAVENOUS | Status: DC
  Administered 2016-09-18: 10:00:00 via INTRAVENOUS

## 2016-09-18 NOTE — Progress Notes (Signed)
IV antibiotic finished infusing. Pt stable, IV removed. WNL. Will escort pt to front entrance.

## 2016-09-19 ENCOUNTER — Encounter (HOSPITAL_COMMUNITY): Payer: Medicare Other

## 2016-09-19 ENCOUNTER — Encounter (HOSPITAL_COMMUNITY)
Admission: RE | Admit: 2016-09-19 | Discharge: 2016-09-19 | Disposition: A | Discharge status: Home / Self Care | Payer: Medicare Other | Source: Ambulatory Visit | Attending: Family | Admitting: Family

## 2016-09-19 DIAGNOSIS — B965 Pseudomonas (aeruginosa) (mallei) (pseudomallei) as the cause of diseases classified elsewhere: Secondary | ICD-10-CM | POA: Diagnosis not present

## 2016-09-19 DIAGNOSIS — L97919 Non-pressure chronic ulcer of unspecified part of right lower leg with unspecified severity: Secondary | ICD-10-CM | POA: Diagnosis not present

## 2016-09-19 MED ORDER — SODIUM CHLORIDE 0.9 % IV SOLN
INTRAVENOUS | Status: DC
  Administered 2016-09-19: 08:00:00 via INTRAVENOUS

## 2016-09-19 MED ORDER — DEXTROSE 5 % IV SOLN
2.0000 g | Freq: Once | INTRAVENOUS | Status: AC
  Administered 2016-09-19: 2 g via INTRAVENOUS
  Filled 2016-09-19: qty 2

## 2016-09-20 ENCOUNTER — Encounter (HOSPITAL_COMMUNITY): Payer: Medicare Other

## 2016-09-20 ENCOUNTER — Encounter (HOSPITAL_COMMUNITY)
Admission: RE | Admit: 2016-09-20 | Discharge: 2016-09-20 | Disposition: A | Discharge status: Home / Self Care | Payer: Medicare Other | Source: Ambulatory Visit | Attending: Family | Admitting: Family

## 2016-09-20 DIAGNOSIS — L97919 Non-pressure chronic ulcer of unspecified part of right lower leg with unspecified severity: Secondary | ICD-10-CM | POA: Diagnosis not present

## 2016-09-20 DIAGNOSIS — B965 Pseudomonas (aeruginosa) (mallei) (pseudomallei) as the cause of diseases classified elsewhere: Secondary | ICD-10-CM | POA: Diagnosis not present

## 2016-09-20 MED ORDER — SODIUM CHLORIDE 0.9% FLUSH
INTRAVENOUS | Status: AC
  Filled 2016-09-20: qty 10

## 2016-09-20 MED ORDER — SODIUM CHLORIDE 0.9 % IV SOLN
INTRAVENOUS | Status: DC
  Administered 2016-09-20: 08:00:00 via INTRAVENOUS

## 2016-09-20 MED ORDER — DEXTROSE 5 % IV SOLN
2.0000 g | INTRAVENOUS | Status: DC
  Administered 2016-09-20: 2 g via INTRAVENOUS
  Filled 2016-09-20: qty 2

## 2016-09-21 ENCOUNTER — Ambulatory Visit (HOSPITAL_COMMUNITY): Payer: Medicare Other | Admitting: Physical Therapy

## 2016-09-21 ENCOUNTER — Encounter (HOSPITAL_COMMUNITY)
Admission: RE | Admit: 2016-09-21 | Discharge: 2016-09-21 | Disposition: A | Discharge status: Home / Self Care | Payer: Medicare Other | Source: Ambulatory Visit | Attending: Family | Admitting: Family

## 2016-09-21 ENCOUNTER — Encounter (HOSPITAL_COMMUNITY): Payer: Medicare Other

## 2016-09-21 DIAGNOSIS — M79661 Pain in right lower leg: Secondary | ICD-10-CM | POA: Diagnosis not present

## 2016-09-21 DIAGNOSIS — R262 Difficulty in walking, not elsewhere classified: Secondary | ICD-10-CM

## 2016-09-21 DIAGNOSIS — I83013 Varicose veins of right lower extremity with ulcer of ankle: Secondary | ICD-10-CM

## 2016-09-21 DIAGNOSIS — L97319 Non-pressure chronic ulcer of right ankle with unspecified severity: Secondary | ICD-10-CM | POA: Diagnosis not present

## 2016-09-21 DIAGNOSIS — L97311 Non-pressure chronic ulcer of right ankle limited to breakdown of skin: Secondary | ICD-10-CM | POA: Diagnosis not present

## 2016-09-21 DIAGNOSIS — M25571 Pain in right ankle and joints of right foot: Secondary | ICD-10-CM | POA: Diagnosis not present

## 2016-09-21 DIAGNOSIS — B965 Pseudomonas (aeruginosa) (mallei) (pseudomallei) as the cause of diseases classified elsewhere: Secondary | ICD-10-CM | POA: Diagnosis not present

## 2016-09-21 DIAGNOSIS — L97919 Non-pressure chronic ulcer of unspecified part of right lower leg with unspecified severity: Secondary | ICD-10-CM | POA: Diagnosis not present

## 2016-09-21 MED ORDER — SODIUM CHLORIDE 0.9 % IV SOLN
INTRAVENOUS | Status: DC
  Administered 2016-09-21: 250 mL via INTRAVENOUS

## 2016-09-21 MED ORDER — DEXTROSE 5 % IV SOLN
2.0000 g | Freq: Once | INTRAVENOUS | Status: AC
  Administered 2016-09-21: 2 g via INTRAVENOUS
  Filled 2016-09-21: qty 2

## 2016-09-21 NOTE — Therapy (Signed)
[facility] [address] [place], [place], [address] Phone: [phone]   Fax:  [phone]  Wound Care Therapy  Patient Details  Name: Jody [name] MRN: [id number] Date of Birth: 1928/04/02 Referring Provider: Evelina [name]  Encounter Date: 09/21/2016      PT End of Session - 09/21/16 1217    Visit Number 8   Number of Visits 24   Date for PT Re-Evaluation 09/01/16   Authorization Type [employer] medicare   Authorization - Visit Number 8   Authorization - Number of Visits 24   PT Start Time 8144   PT Stop Time 1110   PT Time Calculation (min) 30 min   Activity Tolerance Patient limited by pain   Behavior During Therapy [facility] for tasks assessed/performed      Past Medical History:  Diagnosis Date  . Afib ([place])   . Anemia   . CAD (coronary artery disease)   . Chronic diastolic heart failure ([place])   . HTN (hypertension)   . Mitral regurgitation    Moderate, echo, 07/2012  . Pulmonary hypertension ([place])     Past Surgical History:  Procedure Laterality Date  . JOINT REPLACEMENT Right Feb 2015   Dr. Case- [facility]ell Noss  . TOTAL KNEE ARTHROPLASTY Left     There were no vitals filed for this visit.                  Wound Therapy - 09/21/16 1204    Subjective Pt has been completing IV antibiotics in two visits.  States she is having quite a bit of pain but it is better than it wal    Patient and Family Stated Goals For the wound to heal    Date of Onset 05/06/16   Prior Treatments antibiotics, self-dressing with xeroform/gauze    Pain Assessment No/denies pain   Pain Score 6   was a 10/10   Pain Type Chronic pain   Pain Location Ankle   Pain Orientation Right   Pain Onset On-going   Patients Stated Pain Goal 0   Evaluation and Treatment Procedures Explained to Patient/Family Yes   Evaluation and Treatment Procedures agreed to   Wound Properties Date First Assessed: 08/02/16 Time First Assessed: 1125 Wound  Type: Venous stasis ulcer Location: Leg Location Orientation: Right;Lower Wound Description (Comments): R lateral lower LE  Present on Admission: Yes   Dressing Type Gauze (Comment)   Dressing Changed Changed   Dressing Status Clean;Dry;Intact   Dressing Change Frequency PRN   Site / Wound Assessment Clean;Dry   % Wound base Red or Granulating 20%  was 30   % Wound base Yellow/Fibrinous Exudate 80%  was 70   Peri-wound Assessment Edema;Hemosiderin  slight increased redness but pt states much better   Wound Length (cm) 8 cm   Wound Width (cm) 6 cm   Wound Depth (cm) --  unknown due to signifiicant slough   Margins Attached edges (approximated)   Closure None   Drainage Amount Moderate   Drainage Description Serous   Treatment Cleansed;Debridement (Selective)   Selective Debridement - Location wound bed    Selective Debridement - Tools Used Forceps;Scalpel   Selective Debridement - Tissue Removed slough   Wound Therapy - Clinical Statement PT with a small dressing over wound which has bottle necked the swelling.  Pt states that this is how it was dressed last visit but notes state that profore lite was placed on LE.  Pt is taking dressing  off.   Pt wound maybe stabilizing,(not getting any bigger) since IV antibiotic but it is not improving.  Pt may need to be referred to a wound center for possible hybaric treatment.  Pt goes to [facility] on Friday for a recheck.  It pt continues her therapist would highly recommend twice a week but pt has been resistant to coming twice a week.     Wound Therapy - Functional Problem List difficulty walking and standing due to pain    Factors Delaying/Impairing Wound Healing Infection - systemic/local;Vascular compromise   Hydrotherapy Plan Debridement;Dressing change;Patient/family education  manual to decrease swelling when needed    Wound Therapy - Frequency --  2x week for 6 additional weeks.    Wound Therapy - Current Recommendations PT   Wound Plan  debridement, manual and dressing change using appropriate bandaging conducive to wound environment..  Pt has two more days of IV and oral antibiotic keep an eye on wound to make sure that cellulitis does not return.    Dressing  silver hydrofiber, folllowed by profore since pt has been cleared of any arterial blockages.                     PT Short Term Goals - 09/21/16 1218      PT SHORT TERM GOAL #1   Title Patient to experience pain no more than 5/10 to allow pt to be standing/walking  for 30 mintues in comfort to tolerate job activities.    Time 2   Period Weeks   Status On-going     PT SHORT TERM GOAL #2   Title Wound granulationto have increased by at least 50% to decrease risk of infection.   Time 2   Period Weeks   Status On-going     PT SHORT TERM GOAL #3   Title Pt drainage to be minimal to decrease risk of infection    Time 2   Period Weeks   Status On-going           PT Long Term Goals - 09/21/16 1218      PT LONG TERM GOAL #1   Title  Pain 0/10 to allow pt to be up walking/standing for 2 hours without pain to be able to complete her work activity in comfort.    Time 4   Period Weeks   Status On-going     PT LONG TERM GOAL #2   Title Pt wound to be 100% granulated for reduced risk of infection    Time 4   Period Weeks   Status On-going     PT LONG TERM GOAL #3   Title Wound to have decreased in size to no greater than 1x.5x .5  for pt to feel comfortable with self treatment.    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 PVD (peripheral vascular disease) ([place]) 07/05/2016  . Venous stasis ulcer ([place]) 03/15/2016  . Cellulitis 12/08/2015  . (HFpEF) heart failure with preserved ejection fraction ([place]) 04/16/2013  . Mitral regurgitation   . Anemia   . HTN (hypertension)   . Atrial fibrillation ([place]) 04/02/2009  . DIASTOLIC HEART FAILURE, CHRONIC 04/02/2009    Rayetta Humphrey, PT CLT [phone] 10/05/16, 12:20 PM  [facility] [address] [place], [place], [address] Phone: [phone]   Fax:  [phone]  Name: Jody [name] MRN: [id number] Date of Birth: 01-28-1928

## 2016-09-21 NOTE — Progress Notes (Signed)
IV to right forearm saline locked. [place] to site with stockinette cover. Tolerated well.

## 2016-09-22 ENCOUNTER — Encounter (HOSPITAL_COMMUNITY)
Admission: RE | Admit: 2016-09-22 | Discharge: 2016-09-22 | Disposition: A | Discharge status: Home / Self Care | Payer: Medicare Other | Source: Ambulatory Visit | Attending: Family | Admitting: Family

## 2016-09-22 ENCOUNTER — Encounter (HOSPITAL_COMMUNITY): Payer: Self-pay

## 2016-09-22 ENCOUNTER — Encounter (HOSPITAL_COMMUNITY): Payer: Medicare Other

## 2016-09-22 ENCOUNTER — Ambulatory Visit: Payer: Medicare Other | Admitting: Family

## 2016-09-22 DIAGNOSIS — L97919 Non-pressure chronic ulcer of unspecified part of right lower leg with unspecified severity: Secondary | ICD-10-CM | POA: Diagnosis not present

## 2016-09-22 DIAGNOSIS — B965 Pseudomonas (aeruginosa) (mallei) (pseudomallei) as the cause of diseases classified elsewhere: Secondary | ICD-10-CM | POA: Diagnosis not present

## 2016-09-22 MED ORDER — DEXTROSE 5 % IV SOLN
2.0000 g | Freq: Once | INTRAVENOUS | Status: AC
  Administered 2016-09-22: 2 g via INTRAVENOUS
  Filled 2016-09-22: qty 2

## 2016-09-22 MED ORDER — SODIUM CHLORIDE 0.9% FLUSH
INTRAVENOUS | Status: AC
  Filled 2016-09-22: qty 10

## 2016-09-22 MED ORDER — SODIUM CHLORIDE 0.9 % IV SOLN
INTRAVENOUS | Status: DC
  Administered 2016-09-22: 250 mL via INTRAVENOUS

## 2016-09-23 ENCOUNTER — Encounter (HOSPITAL_COMMUNITY): Payer: Medicare Other

## 2016-09-23 ENCOUNTER — Encounter (HOSPITAL_COMMUNITY)
Admission: RE | Admit: 2016-09-23 | Discharge: 2016-09-23 | Disposition: A | Discharge status: Home / Self Care | Payer: Medicare Other | Source: Ambulatory Visit | Attending: Family | Admitting: Family

## 2016-09-23 ENCOUNTER — Encounter: Payer: Self-pay | Admitting: Family

## 2016-09-23 ENCOUNTER — Ambulatory Visit (INDEPENDENT_AMBULATORY_CARE_PROVIDER_SITE_OTHER): Payer: Medicare Other | Admitting: Family

## 2016-09-23 VITALS — BP 138/83 | HR 73 | Temp 97.4°F | Ht 62.0 in | Wt 116.0 lb

## 2016-09-23 DIAGNOSIS — L97919 Non-pressure chronic ulcer of unspecified part of right lower leg with unspecified severity: Secondary | ICD-10-CM | POA: Diagnosis not present

## 2016-09-23 DIAGNOSIS — I83018 Varicose veins of right lower extremity with ulcer other part of lower leg: Secondary | ICD-10-CM

## 2016-09-23 DIAGNOSIS — L97811 Non-pressure chronic ulcer of other part of right lower leg limited to breakdown of skin: Secondary | ICD-10-CM

## 2016-09-23 DIAGNOSIS — B965 Pseudomonas (aeruginosa) (mallei) (pseudomallei) as the cause of diseases classified elsewhere: Secondary | ICD-10-CM | POA: Diagnosis not present

## 2016-09-23 MED ORDER — SODIUM CHLORIDE 0.9 % IV SOLN
Freq: Once | INTRAVENOUS | Status: AC
  Administered 2016-09-23: 250 mL via INTRAVENOUS

## 2016-09-23 MED ORDER — DEXTROSE 5 % IV SOLN
2.0000 g | Freq: Once | INTRAVENOUS | Status: AC
  Administered 2016-09-23: 2 g via INTRAVENOUS
  Filled 2016-09-23: qty 2

## 2016-09-23 NOTE — Progress Notes (Signed)
   Subjective:    Patient ID: Jody [name], female    DOB: [birth year], 83 y.o.   MRN: [id number]  HPI PT presents to the office today to recheck vascular wound. PT is followed by wound care weekly. PT received 10 days of IV antibiotics of cefepime. PT reports her pain is "some what better". PT states the pain is intermittent pain if 4 out 10.   According to wound care's note her wound is 8cmX6cm . The dressing is intact.   Pt requesting to have her FMLA paperwork completed. PT requesting to go back to work full time with no restrictions.    Review of Systems  Skin: Positive for wound.  All other systems reviewed and are negative.      Objective:   Physical Exam  Constitutional: She is oriented to person, place, and time. She appears well-developed and well-nourished. No distress.  HENT:  Head: Normocephalic.  Eyes: Pupils are equal, round, and reactive to light.  Neck: Normal range of motion. Neck supple. No thyromegaly present.  Cardiovascular: Normal rate, regular rhythm, normal heart sounds and intact distal pulses.   No murmur heard. Pulmonary/Chest: Effort normal and breath sounds normal. No respiratory distress. She has no wheezes.  Abdominal: Soft. Bowel sounds are normal. She exhibits no distension. There is no tenderness.  Musculoskeletal: Normal range of motion. She exhibits no edema or tenderness.  Neurological: She is alert and oriented to person, place, and time.  Skin: Skin is warm and dry.  Wound on right leg, dressing intact  Psychiatric: She has a normal mood and affect. Her behavior is normal. Judgment and thought content normal.  Vitals reviewed.     BP 138/83   Pulse 73   Temp 97.4 F (36.3 C) (Oral)   Ht 5\' 2"  (1.575 m)   Wt 116 lb (52.6 kg)   BMI 21.22 kg/m      Assessment & Plan:  1. Venous stasis ulcer of other part of right lower leg limited to breakdown of skin, unspecified whether varicose veins present ([place]) Continue wound care  appts Will order wound culture since she has completed her antibiotics Pt to return to work on Monday with no restrictions per her request Pt states her appt for ID is not until July 2018. PT and daughter want to see Infection Disease and will keep this appt   Evelina Dun, FNP

## 2016-09-23 NOTE — Patient Instructions (Signed)
Venous Ulcer A venous ulcer is a shallow sore on your lower leg that is caused by poor circulation in your veins. This condition used to be called stasis ulcer. Veins have valves that help return blood to the heart. If these valves do not work properly, it can cause blood to flow backward and to back up into the veins near the skin. When that happens, blood can pool in your lower legs. The blood can then leak out of your veins, which can irritate your skin. This may cause a break in your skin that becomes a venous ulcer. Venous ulcer is the most common type of lower leg ulcer. You may have venous ulcers on one leg or on both legs. The area where this condition most commonly develops is around the ankles. A venous ulcer may last for a long time (chronic ulcer) or it may return repeatedly (recurrent ulcer). What are the causes? Any condition that causes poor circulation to your legs can lead to a venous ulcer. What increases the risk? This condition is more likely to develop in:  People who are 65 years of age or older.  People who are overweight.  People who are not active.  People who have had a leg ulcer in the past.  People who have clots in their lower leg veins (deep vein thrombosis).  People who have inflammation of their leg veins (phlebitis).  Women who have given birth.  People who smoke.  What are the signs or symptoms? The most common symptom of this condition is an open sore near your ankle. Other symptoms may include:  Swelling.  Thickening of the skin.  Fluid leaking from the ulcer.  Bleeding.  Itching.  Pain and swelling that gets worse when you stand up and feels better when you raise your leg.  Blotchy skin.  Darkening of the skin.  How is this diagnosed? Your health care provider may suspect a venous ulcer based on your medical history and your risk factors. Your health care provider will check the skin on your legs. Other tests may be done to learn more  about the ulcer and to determine the best way to treat it. Tests that may be done include:  Measuring the blood pressure in your arms and legs.  Using sound waves (ultrasound) to measure the blood flow in your leg veins.  How is this treated? You may need to try several different types of treatment to get your venous ulcer to heal. Healing may take a long time. Treatment may include:  Keeping your leg raised (elevated).  Wearing a type of bandage or stocking to compress the veins of your leg (compression therapy). Venous wounds are not likely to heal or to stay healed without compression.  Taking medicines to improve blood flow.  Taking antibiotic medicines to treat infection.  Cleaning your ulcer and removing any dead tissue from the wound (debridement).  Placing various types of medicated bandage (dressings) or medicated wraps on your ulcer. This helps the ulcer to heal and helps to prevent infection.  Surgery is sometimes needed to close the wound using a piece of skin taken from another area of your body (graft). You may need surgery if other treatments are not working or if your ulcer is very deep. Follow these instructions at home: Wound care  Follow instructions from your health care provider about: ? How to take care of your wound. ? When and how you should change your bandage (dressing). ? When you should   remove your dressing. If your dressing is dry and sticks to your leg when you try to remove it, moisten or wet the dressing with saline solution or water so that the dressing can be removed without harming your skin or wound tissue.  Check your wound every day for signs of infection. Have a caregiver do this for you if you are not able to do it yourself. Check for: ? More redness, swelling, or pain. ? More fluid or blood. ? Pus, warmth, or a bad smell. Medicines  Take over-the-counter and prescription medicines only as told by your health care provider.  If you were  prescribed an antibiotic medicine, take it or apply it as told by your health care provider. Do not stop taking or using the antibiotic even if your condition improves. Activity  Do not stand or sit in one position for a long period of time. Rest with your legs raised during the day. If possible, keep your legs above your heart for 30 minutes, 3-4 times a day, or as told by your health care provider.  Do not sit with your legs crossed.  Walk often to increase the blood flow in your legs.Ask your health care provider what level of activity is safe for you.  If you are taking a long ride in a car or plane, take a break to walk around at least once every two hours, or as often as your health care provider recommends. Ask your health care provider if you should take aspirin before long trips. General instructions   Wear elastic stockings, compression stockings, or support hose as told by your health care provider. This is very important.  Raise the foot of your bed as told by your health care provider.  Do not smoke.  Keep all follow-up visits as told by your health care provider. This is important. Contact a health care provider if:  You have a fever.  Your ulcer is getting larger or is not healing.  Your pain gets worse.  You have more redness or swelling around your ulcer.  You have more fluid, blood, or pus coming from your ulcer after it has been cleaned by you or your health care provider.  You have warmth or a bad smell coming from your ulcer. This information is not intended to replace advice given to you by your health care provider. Make sure you discuss any questions you have with your health care provider. Document Released: 12/14/2000 Document Revised: 08/27/2015 Document Reviewed: 07/30/2014 Elsevier Interactive Patient Education  2018 Elsevier Inc.  

## 2016-09-27 ENCOUNTER — Ambulatory Visit (HOSPITAL_COMMUNITY): Payer: Medicare Other | Admitting: Physical Therapy

## 2016-09-27 ENCOUNTER — Other Ambulatory Visit: Payer: Self-pay | Admitting: *Deleted

## 2016-09-27 ENCOUNTER — Other Ambulatory Visit (HOSPITAL_COMMUNITY)
Admission: RE | Admit: 2016-09-27 | Discharge: 2016-09-27 | Disposition: A | Discharge status: Home / Self Care | Payer: Medicare Other | Source: Other Acute Inpatient Hospital | Attending: Family | Admitting: Family

## 2016-09-27 DIAGNOSIS — L97311 Non-pressure chronic ulcer of right ankle limited to breakdown of skin: Secondary | ICD-10-CM | POA: Diagnosis not present

## 2016-09-27 DIAGNOSIS — R262 Difficulty in walking, not elsewhere classified: Secondary | ICD-10-CM | POA: Diagnosis not present

## 2016-09-27 DIAGNOSIS — M79661 Pain in right lower leg: Secondary | ICD-10-CM | POA: Insufficient documentation

## 2016-09-27 DIAGNOSIS — M25571 Pain in right ankle and joints of right foot: Secondary | ICD-10-CM | POA: Diagnosis not present

## 2016-09-27 DIAGNOSIS — L97319 Non-pressure chronic ulcer of right ankle with unspecified severity: Secondary | ICD-10-CM

## 2016-09-27 DIAGNOSIS — I83013 Varicose veins of right lower extremity with ulcer of ankle: Secondary | ICD-10-CM | POA: Diagnosis not present

## 2016-09-27 NOTE — Telephone Encounter (Signed)
Patients daughter, Malachy Mood called and stated that [facility] claims to not have the eliquis rx that was sent in on 06/10/16 and she asked that this be re-sent.

## 2016-09-27 NOTE — Therapy (Signed)
[facility] [address] [place], [place], [address] Phone: [phone]   Fax:  [phone]  Wound Care Therapy  Patient Details  Name: Jody [name] MRN: [id number] Date of Birth: [birth year] Referring Provider: Evelina Dun  Encounter Date: 09/27/2016      PT End of Session - 09/27/16 1427    Visit Number 9   Number of Visits 24   Date for PT Re-Evaluation 09/01/16   Authorization Type [employer] medicare   Authorization - Visit Number 9   Authorization - Number of Visits 24   PT Start Time 0932   PT Stop Time 1105   PT Time Calculation (min) 30 min   Activity Tolerance Patient limited by pain   Behavior During Therapy [facility] for tasks assessed/performed      Past Medical History:  Diagnosis Date  . Afib ([place])   . Anemia   . CAD (coronary artery disease)   . Chronic diastolic heart failure ([place])   . HTN (hypertension)   . Mitral regurgitation    Moderate, echo, 07/2012  . Pulmonary hypertension ([place])     Past Surgical History:  Procedure Laterality Date  . JOINT REPLACEMENT Right Feb 2015   Dr. Case- [facility] Noss  . TOTAL KNEE ARTHROPLASTY Left     There were no vitals filed for this visit.                  Wound Therapy - 09/27/16 1423    Subjective Pt states she is walking better.  Less pain but is now itching.     Pain Assessment No/denies pain   Wound Properties Date First Assessed: 08/02/16 Time First Assessed: 1125 Wound Type: Venous stasis ulcer Location: Leg Location Orientation: Right;Lower Wound Description (Comments): R lateral lower LE  Present on Admission: Yes   Dressing Type Gauze (Comment);Compression wrap   Dressing Changed Changed   Dressing Status Clean;Dry;Intact   Dressing Change Frequency PRN   Site / Wound Assessment Clean;Dry   % Wound base Red or Granulating 30%   % Wound base Yellow/Fibrinous Exudate 70%   Peri-wound Assessment Hemosiderin   Wound Length (cm) 7 cm  was 8cm    Wound Width (cm) 5.2 cm  was 6 cm   Margins Attached edges (approximated)   Closure None   Drainage Amount Minimal   Drainage Description Serous   Treatment Cleansed;Debridement (Selective)   Selective Debridement - Location Rt lateral ankle   Selective Debridement - Tools Used Forceps;Scissors   Selective Debridement - Tissue Removed slough, dry skin   Wound Therapy - Clinical Statement pt with profore still intact and reports overall comfort, less pain with walking.  Able to debride away perimeter dry skin and some slough.  Thoroughly cleansed wound bed prior to culturing wound.  vaseline applied to perimeter and remaining area of LE before rebandaging.  Better tolerance of debridement this session.    Wound Therapy - Functional Problem List difficulty walking and standing due to pain    Factors Delaying/Impairing Wound Healing Infection - systemic/local;Vascular compromise   Hydrotherapy Plan Debridement;Dressing change;Patient/family education   Wound Therapy - Frequency --  2X week   Wound Plan continue with appropriate wound care.  Check on results of culture completed today next session.    Dressing  silverhydrofiber, profore                   PT Short Term Goals - 09/21/16 1218      PT  SHORT TERM GOAL #1   Title Patient to experience pain no more than 5/10 to allow pt to be standing/walking  for 30 mintues in comfort to tolerate job activities.    Time 2   Period Weeks   Status On-going     PT SHORT TERM GOAL #2   Title Wound granulationto have increased by at least 50% to decrease risk of infection.   Time 2   Period Weeks   Status On-going     PT SHORT TERM GOAL #3   Title Pt drainage to be minimal to decrease risk of infection    Time 2   Period Weeks   Status On-going           PT Long Term Goals - 09/21/16 1218      PT LONG TERM GOAL #1   Title  Pain 0/10 to allow pt to be up walking/standing for 2 hours without pain to be able to complete  her work activity in comfort.    Time 4   Period Weeks   Status On-going     PT LONG TERM GOAL #2   Title Pt wound to be 100% granulated for reduced risk of infection    Time 4   Period Weeks   Status On-going     PT LONG TERM GOAL #3   Title Wound to have decreased in size to no greater than 1x.5x .5  for pt to feel comfortable with self treatment.    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 PVD (peripheral vascular disease) ([place]) 07/05/2016  . Venous stasis ulcer ([place]) 03/15/2016  . Cellulitis 12/08/2015  . (HFpEF) heart failure with preserved ejection fraction ([place]) 04/16/2013  . Mitral regurgitation   . Anemia   . HTN (hypertension)   . Atrial fibrillation ([place]) 04/02/2009  . DIASTOLIC HEART FAILURE, CHRONIC 04/02/2009    Teena Irani, PTA/CLT [phone]  09/27/2016, 2:32 PM  [facility] [address] [place], [place], [address] Phone: [phone]   Fax:  [phone]  Name: Jody [name] MRN: [id number] Date of Birth: [birth year]

## 2016-09-30 ENCOUNTER — Telehealth: Payer: Self-pay | Admitting: Family

## 2016-09-30 ENCOUNTER — Other Ambulatory Visit: Payer: Self-pay | Admitting: Family

## 2016-09-30 LAB — AEROBIC CULTURE  (SUPERFICIAL SPECIMEN)

## 2016-09-30 LAB — AEROBIC CULTURE W GRAM STAIN (SUPERFICIAL SPECIMEN)

## 2016-09-30 NOTE — Telephone Encounter (Signed)
Cheryl aware of culture notes Aware of recommendations if sxs worsen Aware of trying to get appointment with ID moved up

## 2016-10-06 ENCOUNTER — Ambulatory Visit (HOSPITAL_COMMUNITY): Payer: Medicare Other | Attending: Family | Admitting: Physical Therapy

## 2016-10-06 DIAGNOSIS — R262 Difficulty in walking, not elsewhere classified: Secondary | ICD-10-CM | POA: Insufficient documentation

## 2016-10-06 DIAGNOSIS — L97319 Non-pressure chronic ulcer of right ankle with unspecified severity: Secondary | ICD-10-CM | POA: Diagnosis not present

## 2016-10-06 DIAGNOSIS — M79661 Pain in right lower leg: Secondary | ICD-10-CM | POA: Insufficient documentation

## 2016-10-06 DIAGNOSIS — I83013 Varicose veins of right lower extremity with ulcer of ankle: Secondary | ICD-10-CM

## 2016-10-06 DIAGNOSIS — M25571 Pain in right ankle and joints of right foot: Secondary | ICD-10-CM | POA: Insufficient documentation

## 2016-10-06 DIAGNOSIS — L97311 Non-pressure chronic ulcer of right ankle limited to breakdown of skin: Secondary | ICD-10-CM | POA: Diagnosis not present

## 2016-10-06 NOTE — Therapy (Signed)
[place], [place], [address] Phone: [phone]   Fax:  [phone]  Wound Care Therapy  Patient Details  Name: Jody [name] MRN: [id number] Date of Birth: Jun 08, 1927 Referring Provider: Evelina Dun  Encounter Date: 10/06/2016      PT End of Session - 10/06/16 1126    Visit Number 10   Number of Visits 24   Date for PT Re-Evaluation 09/01/16   Authorization Type [employer] medicare   Authorization - Visit Number 10   Authorization - Number of Visits 24   PT Start Time 2671   PT Stop Time 1115   PT Time Calculation (min) 35 min   Activity Tolerance Patient limited by pain   Behavior During Therapy [facility] for tasks assessed/performed      Past Medical History:  Diagnosis Date  . Afib ([place])   . Anemia   . CAD (coronary artery disease)   . Chronic diastolic heart failure ([place])   . HTN (hypertension)   . Mitral regurgitation    Moderate, echo, 07/2012  . Pulmonary hypertension ([place])     Past Surgical History:  Procedure Laterality Date  . JOINT REPLACEMENT Right Feb 2015   Dr. Case- Ledell Noss  . TOTAL KNEE ARTHROPLASTY Left     There were no vitals filed for this visit.                  Wound Therapy - 10/06/16 1119    Subjective Pt states that she is going to the infectious Dz MD on 6.24/2018   Patient and Family Stated Goals For the wound to heal    Date of Onset 05/06/16   Prior Treatments antibiotics, self-dressing with xeroform/gauze    Pain Assessment 0-10   Pain Score 6    Pain Type Chronic pain   Pain Location Ankle   Pain Orientation Right;Lower;Lateral   Pain Onset On-going   Patients Stated Pain Goal 0   Evaluation and Treatment Procedures Explained to Patient/Family Yes   Evaluation and Treatment Procedures agreed to   Wound Properties Date First Assessed: 08/02/16 Time First Assessed: 1125 Wound Type: Venous stasis ulcer Location: Leg Location Orientation:  Right;Lower Wound Description (Comments): R lateral lower LE  Present on Admission: Yes   Dressing Type Gauze (Comment)   Dressing Changed Changed   Dressing Status Clean;Dry;Intact   Dressing Change Frequency PRN   Site / Wound Assessment Clean;Dry   % Wound base Red or Granulating 20%  was 30   % Wound base Yellow/Fibrinous Exudate 80%  was 70   Peri-wound Assessment Edema;Hemosiderin  slight increased redness but pt states much better   Wound Length (cm) 7.5 cm  was 7   Wound Width (cm) 4.8 cm  was 5.2   Margins Attached edges (approximated)   Closure None   Drainage Amount Minimal   Drainage Description Serous   Treatment Cleansed;Debridement (Selective)   Selective Debridement - Location wound bed    Selective Debridement - Tools Used Forceps   Selective Debridement - Tissue Removed slough   Wound Therapy - Clinical Statement Pt states toes were going numb from last dressing.  Therapist added increased cotton and decreased tension on dressings.  Pt wound is no longer increasing in size since she was on the IV antibiotic but wound size is not significantly decreasing either.  Pt is not on any antibiotics at this time.  PT will see the infectious disease MD in two weeks .  We will continue debridement and compression bandaging.    Wound Therapy - Functional Problem List difficulty walking and standing due to pain    Factors Delaying/Impairing Wound Healing Infection - systemic/local;Vascular compromise   Hydrotherapy Plan Debridement;Dressing change;Patient/family education  manual to decrease swelling when needed    Wound Therapy - Frequency --  2x week for 4 additional weeks.    Wound Therapy - Current Recommendations PT   Wound Plan debridement, manual and dressing change using appropriate bandaging conducive to wound environment..  Pt has two more days of IV and oral antibiotic keep an eye on wound to make sure that cellulitis does not return.    Dressing  silver hydrofiber,  folllowed by profore since pt has been cleared of any arterial blockages.    light layer of medihoney on edges to prevent sticking                    PT Short Term Goals - 09/21/16 1218      PT SHORT TERM GOAL #1   Title Patient to experience pain no more than 5/10 to allow pt to be standing/walking  for 30 mintues in comfort to tolerate job activities.    Time 2   Period Weeks   Status On-going     PT SHORT TERM GOAL #2   Title Wound granulationto have increased by at least 50% to decrease risk of infection.   Time 2   Period Weeks   Status On-going     PT SHORT TERM GOAL #3   Title Pt drainage to be minimal to decrease risk of infection    Time 2   Period Weeks   Status On-going           PT Long Term Goals - 09/21/16 1218      PT LONG TERM GOAL #1   Title  Pain 0/10 to allow pt to be up walking/standing for 2 hours without pain to be able to complete her work activity in comfort.    Time 4   Period Weeks   Status On-going     PT LONG TERM GOAL #2   Title Pt wound to be 100% granulated for reduced risk of infection    Time 4   Period Weeks   Status On-going     PT LONG TERM GOAL #3   Title Wound to have decreased in size to no greater than 1x.5x .5  for pt to feel comfortable with self treatment.    Time 4   Period Weeks   Status On-going               Plan - 10/06/16 1126    Clinical Impression Statement Able to debride edges of wound.  Pt had significant increased pain but was able to tolerate.    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 PVD (peripheral vascular disease) ([place]) 07/05/2016  .  Venous stasis ulcer ([place]) 03/15/2016  . Cellulitis 12/08/2015  . (HFpEF) heart failure with preserved ejection fraction ([place]) 04/16/2013  . Mitral regurgitation   . Anemia   . HTN (hypertension)   . Atrial fibrillation ([place]) 04/02/2009  . DIASTOLIC HEART FAILURE, CHRONIC 04/02/2009  Rayetta Humphrey, PT CLT [phone] 10/06/2016, 11:28 AM  [place] [address] [place], [place], [address] Phone: [phone]   Fax:  [phone]  Name: Jody [name] MRN: [id number] Date of Birth: Dec 16, 1927

## 2016-10-07 ENCOUNTER — Other Ambulatory Visit: Payer: Self-pay | Admitting: Family

## 2016-10-10 ENCOUNTER — Ambulatory Visit (HOSPITAL_COMMUNITY): Payer: Medicare Other | Admitting: Physical Therapy

## 2016-10-10 DIAGNOSIS — M25571 Pain in right ankle and joints of right foot: Secondary | ICD-10-CM

## 2016-10-10 DIAGNOSIS — I83013 Varicose veins of right lower extremity with ulcer of ankle: Secondary | ICD-10-CM | POA: Diagnosis not present

## 2016-10-10 DIAGNOSIS — L97319 Non-pressure chronic ulcer of right ankle with unspecified severity: Secondary | ICD-10-CM

## 2016-10-10 DIAGNOSIS — R262 Difficulty in walking, not elsewhere classified: Secondary | ICD-10-CM | POA: Diagnosis not present

## 2016-10-10 DIAGNOSIS — M79661 Pain in right lower leg: Secondary | ICD-10-CM | POA: Diagnosis not present

## 2016-10-10 DIAGNOSIS — L97311 Non-pressure chronic ulcer of right ankle limited to breakdown of skin: Secondary | ICD-10-CM

## 2016-10-10 NOTE — Therapy (Signed)
[place], [place], [address] Phone: [phone]   Fax:  [phone]  Wound Care Therapy  Patient Details  Name: Jody [name] MRN: [id number] Date of Birth: Jun 09, 1927 Referring Provider: Evelina Dun  Encounter Date: 10/10/2016      PT End of Session - 10/10/16 1111    Visit Number 11   Number of Visits 24   Date for PT Re-Evaluation 09/01/16   Authorization Type [employer] medicare   Authorization - Visit Number 11   Authorization - Number of Visits 24   PT Start Time 4128   PT Stop Time 1102   PT Time Calculation (min) 32 min   Activity Tolerance Patient limited by pain   Behavior During Therapy [facility] for tasks assessed/performed      Past Medical History:  Diagnosis Date  . Afib ([place])   . Anemia   . CAD (coronary artery disease)   . Chronic diastolic heart failure ([place])   . HTN (hypertension)   . Mitral regurgitation    Moderate, echo, 07/2012  . Pulmonary hypertension ([place])     Past Surgical History:  Procedure Laterality Date  . JOINT REPLACEMENT Right Feb 2015   Dr. Case- Ledell Noss  . TOTAL KNEE ARTHROPLASTY Left     There were no vitals filed for this visit.                  Wound Therapy - 10/10/16 1108    Subjective Pt states that the dressing was no to tight this time but her wound continues to be painful   Patient and Family Stated Goals For the wound to heal    Date of Onset 05/06/16   Prior Treatments antibiotics, self-dressing with xeroform/gauze    Pain Assessment No/denies pain   Pain Score 5    Pain Type Chronic pain   Pain Location Ankle   Pain Orientation Right;Lateral   Pain Onset On-going   Patients Stated Pain Goal 0   Evaluation and Treatment Procedures Explained to Patient/Family Yes   Evaluation and Treatment Procedures agreed to   Wound Properties Date First Assessed: 08/02/16 Time First Assessed: 1125 Wound Type: Venous stasis ulcer Location:  Leg Location Orientation: Right;Lower Wound Description (Comments): R lateral lower LE  Present on Admission: Yes   Dressing Type Gauze (Comment)   Dressing Changed Changed   Dressing Status Clean;Dry;Intact   Dressing Change Frequency PRN   Site / Wound Assessment Clean;Dry   % Wound base Red or Granulating 20%  was 30   % Wound base Yellow/Fibrinous Exudate 80%  was 70   Peri-wound Assessment Edema;Hemosiderin  slight increased redness but pt states much better   Wound Length (cm) 7.5 cm   Wound Width (cm) 4.5 cm   Margins Attached edges (approximated)   Closure None   Drainage Amount Minimal   Drainage Description Serous   Treatment Cleansed;Debridement (Selective)   Selective Debridement - Location wound bed    Selective Debridement - Tools Used Forceps;Scalpel   Selective Debridement - Tissue Removed slough   Wound Therapy - Clinical Statement Pt had no difficulty with toes going numb.  Pt has one more treatment until she goes to the infectious disease MD>  Wound continues to slowly decrease in size since pt was on IV antibiotics    Wound Therapy - Functional Problem List difficulty walking and standing due to pain    Factors Delaying/Impairing Wound Healing Infection - systemic/local;Vascular compromise  Hydrotherapy Plan Debridement;Dressing change;Patient/family education  manual to decrease swelling when needed    Wound Therapy - Frequency --  2x week for 4 additional weeks.    Wound Therapy - Current Recommendations PT   Wound Plan debridement, manual and dressing change using appropriate bandaging conducive to wound environment..  Pt has two more days of IV and oral antibiotic keep an eye on wound to make sure that cellulitis does not return.    Dressing  silver hydrofiber, folllowed by profore since pt has been cleared of any arterial blockages.    light layer of medihoney on edges to prevent sticking                    PT Short Term Goals - 09/21/16 1218       PT SHORT TERM GOAL #1   Title Patient to experience pain no more than 5/10 to allow pt to be standing/walking  for 30 mintues in comfort to tolerate job activities.    Time 2   Period Weeks   Status On-going     PT SHORT TERM GOAL #2   Title Wound granulationto have increased by at least 50% to decrease risk of infection.   Time 2   Period Weeks   Status On-going     PT SHORT TERM GOAL #3   Title Pt drainage to be minimal to decrease risk of infection    Time 2   Period Weeks   Status On-going           PT Long Term Goals - 09/21/16 1218      PT LONG TERM GOAL #1   Title  Pain 0/10 to allow pt to be up walking/standing for 2 hours without pain to be able to complete her work activity in comfort.    Time 4   Period Weeks   Status On-going     PT LONG TERM GOAL #2   Title Pt wound to be 100% granulated for reduced risk of infection    Time 4   Period Weeks   Status On-going     PT LONG TERM GOAL #3   Title Wound to have decreased in size to no greater than 1x.5x .5  for pt to feel comfortable with self treatment.    Time 4   Period Weeks   Status On-going               Plan - 10/10/16 1112    Clinical Impression Statement Pt tolerated therapist using scapel to debride slough inside wound bed today.  Wound bed has improved granualation and decreased size.     Rehab Potential Fair   PT Frequency 2x / week   PT Duration 4 weeks   PT Treatment/Interventions ADLs/Self Care Home Management;Other (comment)  debridement as tolerated    PT Next Visit Plan see above       Patient will benefit from skilled therapeutic intervention in order to improve the following deficits and impairments:  Pain  Visit Diagnosis: Pain in right lower leg  Difficulty in walking, not elsewhere classified  Varicose veins of right lower extremity with ulcer of ankle (HCC)  Pain in right ankle and joints of right foot  Non-pressure chronic ulcer of right ankle limited to  breakdown of skin [facility])     Problem List Patient Active Problem List   Diagnosis Date Noted  . PVD (peripheral vascular disease) ([place]) 07/05/2016  . Venous stasis ulcer ([place]) 03/15/2016  .  Cellulitis 12/08/2015  . (HFpEF) heart failure with preserved ejection fraction ([place]) 04/16/2013  . Mitral regurgitation   . Anemia   . HTN (hypertension)   . Atrial fibrillation ([place]) 04/02/2009  . DIASTOLIC HEART FAILURE, CHRONIC 04/02/2009   Rayetta Humphrey, PT CLT [phone] 10/10/2016, 11:13 AM  [facility] [address] [place], [place], [address] Phone: [phone]   Fax:  [phone]  Name: Jody [name] MRN: [id number] Date of Birth: 15-Oct-1927

## 2016-10-17 ENCOUNTER — Other Ambulatory Visit: Payer: Self-pay | Admitting: Family

## 2016-10-19 ENCOUNTER — Ambulatory Visit (HOSPITAL_COMMUNITY): Payer: Medicare Other | Admitting: Physical Therapy

## 2016-10-19 DIAGNOSIS — L97311 Non-pressure chronic ulcer of right ankle limited to breakdown of skin: Secondary | ICD-10-CM | POA: Diagnosis not present

## 2016-10-19 DIAGNOSIS — I83013 Varicose veins of right lower extremity with ulcer of ankle: Secondary | ICD-10-CM | POA: Diagnosis not present

## 2016-10-19 DIAGNOSIS — M79661 Pain in right lower leg: Secondary | ICD-10-CM | POA: Diagnosis not present

## 2016-10-19 DIAGNOSIS — M25571 Pain in right ankle and joints of right foot: Secondary | ICD-10-CM | POA: Diagnosis not present

## 2016-10-19 DIAGNOSIS — L97319 Non-pressure chronic ulcer of right ankle with unspecified severity: Secondary | ICD-10-CM | POA: Diagnosis not present

## 2016-10-19 DIAGNOSIS — R262 Difficulty in walking, not elsewhere classified: Secondary | ICD-10-CM | POA: Diagnosis not present

## 2016-10-19 NOTE — Therapy (Signed)
[place], [place], [address] Phone: [phone]   Fax:  [phone]  Wound Care Therapy  Patient Details  Name: Jody [name] MRN: [id number] Date of Birth: April 15, 1927 Referring Provider: Evelina Dun  Encounter Date: 10/19/2016      PT End of Session - 10/19/16 1117    Visit Number 12   Number of Visits 24   Date for PT Re-Evaluation 09/01/16   Authorization Type [employer] healthcare medicare   Authorization - Visit Number 12   Authorization - Number of Visits 24   PT Start Time 4431   PT Stop Time 1110   PT Time Calculation (min) 30 min   Activity Tolerance Patient limited by pain   Behavior During Therapy [facility] for tasks assessed/performed      Past Medical History:  Diagnosis Date  . Afib ([place])   . Anemia   . CAD (coronary artery disease)   . Chronic diastolic heart failure ([place])   . HTN (hypertension)   . Mitral regurgitation    Moderate, echo, 07/2012  . Pulmonary hypertension ([place])     Past Surgical History:  Procedure Laterality Date  . JOINT REPLACEMENT Right Feb 2015   Dr. Case- Ledell Noss  . TOTAL KNEE ARTHROPLASTY Left     There were no vitals filed for this visit.                  Wound Therapy - 10/19/16 1113    Subjective Pt states no pain.  She goes to the infectious Dz MD next week.    Patient and Family Stated Goals For the wound to heal    Date of Onset 05/06/16   Prior Treatments antibiotics, self-dressing with xeroform/gauze    Pain Assessment No/denies pain   Evaluation and Treatment Procedures Explained to Patient/Family Yes   Evaluation and Treatment Procedures agreed to   Wound Properties Date First Assessed: 08/02/16 Time First Assessed: 1125 Wound Type: Venous stasis ulcer Location: Leg Location Orientation: Right;Lower Wound Description (Comments): R lateral lower LE  Present on Admission: Yes   Dressing Type Gauze (Comment)   Dressing Changed Changed   Dressing Status Clean;Dry;Intact   Dressing Change Frequency PRN   Site / Wound Assessment Clean;Dry   % Wound base Red or Granulating 70%  was 20 on 7/5    % Wound base Yellow/Fibrinous Exudate 30%  was 70   Peri-wound Assessment Edema;Hemosiderin  slight increased redness but pt states much better   Wound Length (cm) 7.2 cm  was 7.5 on 7/5   Wound Width (cm) 3 cm  was 4.8 on 7/5    Margins Attached edges (approximated)   Closure None   Drainage Amount Minimal   Drainage Description Serous   Treatment Cleansed;Debridement (Selective)   Selective Debridement - Location wound bed    Selective Debridement - Tools Used Forceps   Selective Debridement - Tissue Removed slough   Wound Therapy - Clinical Statement Wound continues to improve in granulation and decrease in size since IV antibiotics.   Wound Therapy - Functional Problem List difficulty walking and standing due to pain    Factors Delaying/Impairing Wound Healing Infection - systemic/local;Vascular compromise   Hydrotherapy Plan Debridement;Dressing change;Patient/family education  manual to decrease swelling when needed    Wound Therapy - Frequency --  2x week for 4 additional weeks.    Wound Therapy - Current Recommendations PT   Wound Plan debridement, manual and dressing change using appropriate bandaging  conducive to wound environment..  Pt has two more days of IV and oral antibiotic keep an eye on wound to make sure that cellulitis does not return.    Dressing  silver hydrofiber, folllowed by profore since pt has been cleared of any arterial blockages.    light layer of medihoney on yellow slough prior to silver                   PT Short Term Goals - 09/21/16 1218      PT SHORT TERM GOAL #1   Title Patient to experience pain no more than 5/10 to allow pt to be standing/walking  for 30 mintues in comfort to tolerate job activities.    Time 2   Period Weeks   Status On-going     PT SHORT TERM GOAL #2    Title Wound granulationto have increased by at least 50% to decrease risk of infection.   Time 2   Period Weeks   Status On-going     PT SHORT TERM GOAL #3   Title Pt drainage to be minimal to decrease risk of infection    Time 2   Period Weeks   Status On-going           PT Long Term Goals - 09/21/16 1218      PT LONG TERM GOAL #1   Title  Pain 0/10 to allow pt to be up walking/standing for 2 hours without pain to be able to complete her work activity in comfort.    Time 4   Period Weeks   Status On-going     PT LONG TERM GOAL #2   Title Pt wound to be 100% granulated for reduced risk of infection    Time 4   Period Weeks   Status On-going     PT LONG TERM GOAL #3   Title Wound to have decreased in size to no greater than 1x.5x .5  for pt to feel comfortable with self treatment.    Time 4   Period Weeks   Status On-going               Plan - 10/19/16 1117    Clinical Impression Statement as above   Rehab Potential Fair   PT Frequency 2x / week   PT Duration 4 weeks   PT Treatment/Interventions ADLs/Self Care Home Management;Other (comment)  debridement as tolerated    PT Next Visit Plan see above       Patient will benefit from skilled therapeutic intervention in order to improve the following deficits and impairments:  Pain  Visit Diagnosis: Pain in right lower leg  Difficulty in walking, not elsewhere classified  Varicose veins of right lower extremity with ulcer of ankle [employer])     Problem List Patient Active Problem List   Diagnosis Date Noted  . PVD (peripheral vascular disease) ([place]) 07/05/2016  . Venous stasis ulcer ([place]) 03/15/2016  . Cellulitis 12/08/2015  . (HFpEF) heart failure with preserved ejection fraction ([place]) 04/16/2013  . Mitral regurgitation   . Anemia   . HTN (hypertension)   . Atrial fibrillation ([place]) 04/02/2009  . DIASTOLIC HEART FAILURE, CHRONIC 04/02/2009    Jody [name], PT CLT [phone] 10/19/2016,  11:18 AM  [place], [place], [address] Phone: [phone]   Fax:  [phone]  Name: Jody [name] MRN: [id number] Date of Birth: May 05, 1927

## 2016-10-24 ENCOUNTER — Other Ambulatory Visit: Payer: Self-pay | Admitting: Family

## 2016-10-24 ENCOUNTER — Ambulatory Visit (HOSPITAL_COMMUNITY): Payer: Medicare Other | Admitting: Physical Therapy

## 2016-10-24 ENCOUNTER — Telehealth (HOSPITAL_COMMUNITY): Payer: Self-pay | Admitting: Family

## 2016-10-24 ENCOUNTER — Ambulatory Visit (INDEPENDENT_AMBULATORY_CARE_PROVIDER_SITE_OTHER): Payer: Medicare Other | Admitting: Internal Medicine

## 2016-10-24 VITALS — BP 160/9 | HR 84 | Temp 97.4°F | Wt 110.8 lb

## 2016-10-24 DIAGNOSIS — I83003 Varicose veins of unspecified lower extremity with ulcer of ankle: Secondary | ICD-10-CM | POA: Diagnosis not present

## 2016-10-24 DIAGNOSIS — L97301 Non-pressure chronic ulcer of unspecified ankle limited to breakdown of skin: Secondary | ICD-10-CM | POA: Diagnosis not present

## 2016-10-24 NOTE — Telephone Encounter (Signed)
10/24/16  cx pt left a message that she was going to dr in [place] and unsure what he will do to her

## 2016-10-24 NOTE — Progress Notes (Signed)
[facility] for Infectious Disease  Reason for Consult: Possible infection of chronic, right leg venous stasis ulcer Referring Provider: Evelina Dun, FNP  Assessment: Based on the recent improvement in the wound and its appearance today I am not convinced that active infection is contributing to poor wound healing. Her Pseudomonas isolate has developed progressive resistance, meaning that any antibiotic therapy would now require twice-daily dosing of IV meropenem. I do not believe that this is warranted at this time.   Plan: 1. Continue observation off of antibiotics for now 2. Follow-up in 3 weeks   Patient Active Problem List   Diagnosis Date Noted  . Venous stasis ulcer ([place]) 03/15/2016    Priority: High  . PVD (peripheral vascular disease) ([place]) 07/05/2016  . Cellulitis 12/08/2015  . (HFpEF) heart failure with preserved ejection fraction ([place]) 04/16/2013  . Mitral regurgitation   . Anemia   . HTN (hypertension)   . Atrial fibrillation ([place]) 04/02/2009  . DIASTOLIC HEART FAILURE, CHRONIC 04/02/2009    Patient's Medications  New Prescriptions   No medications on file  Previous Medications   APIXABAN (ELIQUIS) 2.5 MG TABS TABLET    Take 1 tablet (2.5 mg total) by mouth 2 (two) times daily.   DILTIAZEM (CARTIA XT) 240 MG 24 HR CAPSULE    Take 240 mg by mouth daily.   FUROSEMIDE (LASIX) 20 MG TABLET    Take 1 tablet (20 mg total) by mouth daily as needed for edema.   IRON, FERROUS GLUCONATE, PO    Take 27 mg by mouth.   TRAMADOL (ULTRAM) 50 MG TABLET    Take 1-2 tablets (50-100 mg total) by mouth at bedtime as needed.   VITAMIN B-12 (CYANOCOBALAMIN) 1000 MCG TABLET    Take 1,000 mcg by mouth daily.  Modified Medications   No medications on file  Discontinued Medications   CIPROFLOXACIN (CIPRO) 500 MG TABLET    Take 1 tablet (500 mg total) by mouth 2 (two) times daily.    HPI: Jody [name] is a 83 y.o. female with chronic venous insufficiency. For the  past several years she has had problems with an ulcer on her right lateral ankle. She's been followed at the wound center in [facility]. The ulcer finally healed last September only 2 reappear about 6 months ago. She has been followed weekly at the wound center where her wound has been debrided and her leg wrapped. A wound culture done last September grew pseudomonas aeruginosa that was sensitive to all antibiotics tested. Measurements from the wound center show that her ulcer was getting progressively bigger and May and early June. On 09/12/2016 it measured 10 x 6 cm. A culture that was done on 09/08/2016 grew Pseudomonas and Klebsiella sensitive to ciprofloxacin and cefepime. She was started on ciprofloxacin on 09/12/2016. Her daughter tells me that she developed shortness of breath and there was concern that she was having an allergic reaction. She was changed to IV cefepime on 09/14/2016. It was being renally dosed so she only had to have one dose daily. That was given at [facility]. She took it for 10 days. Repeat culture on 09/27/2016 grew Pseudomonas again but the isolate was now resistant to ciprofloxacin and cefepime. She was last seen at the wound center on 10/19/2016. The wound was reported to be looking better and had decreased in size to 7.2 x 3 cm.  Review of Systems: Review of Systems  Constitutional: Negative for chills,  diaphoresis and fever.  Gastrointestinal: Negative for abdominal pain, diarrhea, nausea and vomiting.  Musculoskeletal:       She says that the ulcer on her right ankle is very painful.      Past Medical History:  Diagnosis Date  . Afib ([place])   . Anemia   . CAD (coronary artery disease)   . Chronic diastolic heart failure ([place])   . HTN (hypertension)   . Mitral regurgitation    Moderate, echo, 07/2012  . Pulmonary hypertension ([place])     Social History  Substance Use Topics  . Smoking status: Never Smoker  . Smokeless tobacco: Never Used  .  Alcohol use No    Family History  Problem Relation Age of Onset  . Diabetes Mother    Allergies  Allergen Reactions  . Levaquin [Levofloxacin] Other (See Comments)    Patient stated vision became blurry and became weak.    OBJECTIVE: Vitals:   10/24/16 1354  BP: (!) 160/9  Pulse: 84  Temp: (!) 97.4 F (36.3 C)  TempSrc: Oral  SpO2: 97%  Weight: 110 lb 12.8 oz (50.3 kg)   Body mass index is 20.27 kg/m.   Physical Exam  Constitutional: She is oriented to person, place, and time.  She is in good spirits. She is accompanied by her daughter.  HENT:  She is hard of hearing.  Neurological: She is alert and oriented to person, place, and time.  Skin:  She has an irregularly shaped superficial ulcer on her right lateral ankle approximately the same size as measured at the wound center last week. There is good granulation tissue over most of the wound. She has chronic venous stasis dermatitis. There is no surrounding cellulitis or odor.  Psychiatric: Mood and affect normal.    Microbiology: No results found for this or any previous visit (from the past 240 hour(s)).  Michel Bickers, MD [facility] for [facility] [phone] pager   [phone] cell 10/24/2016, 3:41 PM

## 2016-10-25 ENCOUNTER — Ambulatory Visit (HOSPITAL_COMMUNITY): Payer: Medicare Other | Admitting: Physical Therapy

## 2016-10-25 ENCOUNTER — Ambulatory Visit (HOSPITAL_COMMUNITY): Payer: Medicare Other

## 2016-10-25 DIAGNOSIS — L97311 Non-pressure chronic ulcer of right ankle limited to breakdown of skin: Secondary | ICD-10-CM | POA: Diagnosis not present

## 2016-10-25 DIAGNOSIS — M79661 Pain in right lower leg: Secondary | ICD-10-CM

## 2016-10-25 DIAGNOSIS — M25571 Pain in right ankle and joints of right foot: Secondary | ICD-10-CM | POA: Diagnosis not present

## 2016-10-25 DIAGNOSIS — L97319 Non-pressure chronic ulcer of right ankle with unspecified severity: Secondary | ICD-10-CM

## 2016-10-25 DIAGNOSIS — R262 Difficulty in walking, not elsewhere classified: Secondary | ICD-10-CM

## 2016-10-25 DIAGNOSIS — I83013 Varicose veins of right lower extremity with ulcer of ankle: Secondary | ICD-10-CM

## 2016-10-25 NOTE — Therapy (Signed)
[place], [place], [address] Phone: [phone]   Fax:  [phone]  Wound Care Therapy  Patient Details  Name: Jody [name] MRN: [id number] Date of Birth: [birth year] Referring Provider: Evelina Dun  Encounter Date: 10/25/2016      PT End of Session - 10/25/16 1254    Visit Number 13   Number of Visits 24   Date for PT Re-Evaluation 09/01/16   Authorization Type [employer] medicare   Authorization - Visit Number 13   Authorization - Number of Visits 24   PT Start Time [phone]   PT Stop Time 0900   PT Time Calculation (min) 39 min   Activity Tolerance Patient tolerated treatment well;No increased pain   Behavior During Therapy WFL for tasks assessed/performed      Past Medical History:  Diagnosis Date  . Afib ([place])   . Anemia   . CAD (coronary artery disease)   . Chronic diastolic heart failure ([place])   . HTN (hypertension)   . Mitral regurgitation    Moderate, echo, 07/2012  . Pulmonary hypertension ([place])     Past Surgical History:  Procedure Laterality Date  . JOINT REPLACEMENT Right Feb 2015   Dr. Case- Ledell Noss  . TOTAL KNEE ARTHROPLASTY Left     There were no vitals filed for this visit.       Subjective Assessment - 10/25/16 0856    Subjective Went to MD yesterday, stopped IV antibiotics due to good progress.  No reports of pain currently.     Currently in Pain? No/denies                   Wound Therapy - 10/25/16 0857    Subjective Went to MD yesterday, stopped IV due to good progress.  No reports of pain currently.     Patient and Family Stated Goals For the wound to heal    Date of Onset 05/06/16   Prior Treatments antibiotics, self-dressing with xeroform/gauze    Pain Assessment No/denies pain   Evaluation and Treatment Procedures Explained to Patient/Family Yes   Evaluation and Treatment Procedures agreed to   Wound Properties Date First Assessed: 08/02/16  Time First Assessed: 1125 Wound Type: Venous stasis ulcer Location: Leg Location Orientation: Right;Lower Wound Description (Comments): R lateral lower LE  Present on Admission: Yes   Dressing Type Gauze (Comment)  light layer of honey then silverhydrofiber with gauze, netti   Dressing Changed Changed   Dressing Status Clean;Dry;Intact   Dressing Change Frequency PRN   Site / Wound Assessment Clean;Dry   % Wound base Red or Granulating 75%   % Wound base Yellow/Fibrinous Exudate 30%   Peri-wound Assessment Edema;Hemosiderin   Wound Length (cm) 6.7 cm  was 7.2   Wound Width (cm) 3 cm  was 3   Margins Attached edges (approximated)   Closure None   Drainage Amount Minimal   Drainage Description Serous   Treatment Cleansed;Debridement (Selective)   Selective Debridement - Location wound bed    Selective Debridement - Tools Used Forceps   Selective Debridement - Tissue Removed slough   Wound Therapy - Clinical Statement Wiound and perimeter progressing well with improved granulation with debridement and reduction in wound size.  Continued with light layer of honey over yellow slough prior silver hydrofiber to address drainage and profore for edema control.  No reoprts of pain through session.   Wound Therapy - Functional Problem List difficulty walking and standing  due to pain    Factors Delaying/Impairing Wound Healing Infection - systemic/local;Vascular compromise   Hydrotherapy Plan Debridement;Dressing change;Patient/family education   Wound Therapy - Frequency --  2x/week for 4 weeks   Wound Therapy - Current Recommendations PT   Wound Plan debridement, manual and dressing change using appropriate bandaging conducive to wound environment..  Pt has two more days of IV and oral antibiotic keep an eye on wound to make sure that cellulitis does not return.    Dressing  honey on slough, silver hydrofiber, folllowed by profore since pt has been cleared of any arterial blockages.                      PT Short Term Goals - 09/21/16 1218      PT SHORT TERM GOAL #1   Title Patient to experience pain no more than 5/10 to allow pt to be standing/walking  for 30 mintues in comfort to tolerate job activities.    Time 2   Period Weeks   Status On-going     PT SHORT TERM GOAL #2   Title Wound granulationto have increased by at least 50% to decrease risk of infection.   Time 2   Period Weeks   Status On-going     PT SHORT TERM GOAL #3   Title Pt drainage to be minimal to decrease risk of infection    Time 2   Period Weeks   Status On-going           PT Long Term Goals - 09/21/16 1218      PT LONG TERM GOAL #1   Title  Pain 0/10 to allow pt to be up walking/standing for 2 hours without pain to be able to complete her work activity in comfort.    Time 4   Period Weeks   Status On-going     PT LONG TERM GOAL #2   Title Pt wound to be 100% granulated for reduced risk of infection    Time 4   Period Weeks   Status On-going     PT LONG TERM GOAL #3   Title Wound to have decreased in size to no greater than 1x.5x .5  for pt to feel comfortable with self treatment.    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 PVD (peripheral vascular disease) ([place]) 07/05/2016  . Venous stasis ulcer ([place]) 03/15/2016  . Cellulitis 12/08/2015  . (HFpEF) heart failure with preserved ejection fraction ([place]) 04/16/2013  . Mitral regurgitation   . Anemia   . HTN  (hypertension)   . Atrial fibrillation ([place]) 04/02/2009  . DIASTOLIC HEART FAILURE, CHRONIC 04/02/2009   Ihor Austin, [place]; [place]  Aldona Lento 10/25/2016, 12:55 PM  [place] [address] [place], [place], [address] Phone: [phone]   Fax:  [phone]  Name: Jody [name] MRN: [id number]  Date of Birth: 06-03-1927

## 2016-11-02 ENCOUNTER — Ambulatory Visit (HOSPITAL_COMMUNITY): Payer: Medicare Other | Attending: Family | Admitting: Physical Therapy

## 2016-11-02 DIAGNOSIS — I83013 Varicose veins of right lower extremity with ulcer of ankle: Secondary | ICD-10-CM | POA: Diagnosis not present

## 2016-11-02 DIAGNOSIS — M25571 Pain in right ankle and joints of right foot: Secondary | ICD-10-CM | POA: Diagnosis not present

## 2016-11-02 DIAGNOSIS — M79661 Pain in right lower leg: Secondary | ICD-10-CM | POA: Insufficient documentation

## 2016-11-02 DIAGNOSIS — L97319 Non-pressure chronic ulcer of right ankle with unspecified severity: Secondary | ICD-10-CM | POA: Diagnosis not present

## 2016-11-02 DIAGNOSIS — L97311 Non-pressure chronic ulcer of right ankle limited to breakdown of skin: Secondary | ICD-10-CM | POA: Diagnosis not present

## 2016-11-02 DIAGNOSIS — R262 Difficulty in walking, not elsewhere classified: Secondary | ICD-10-CM | POA: Insufficient documentation

## 2016-11-02 NOTE — Therapy (Addendum)
[place], [place], [address] Phone: [phone]   Fax:  [phone]  Wound Care Therapy  Patient Details  Name: Jody [name] MRN: [id number] Date of Birth: 04/19/1927 Referring Provider: Evelina Dun  Encounter Date: 11/02/2016      PT End of Session - 11/02/16 1120    Visit Number 14   Number of Visits 24   Date for PT Re-Evaluation 09/01/16   Authorization Type [employer] medicare   Authorization - Visit Number 14   Authorization - Number of Visits 24   PT Start Time 5638   PT Stop Time 1110   PT Time Calculation (min) 30 min   Activity Tolerance Patient tolerated treatment well;No increased pain   Behavior During Therapy WFL for tasks assessed/performed      Past Medical History:  Diagnosis Date  . Afib ([place])   . Anemia   . CAD (coronary artery disease)   . Chronic diastolic heart failure ([place])   . HTN (hypertension)   . Mitral regurgitation    Moderate, echo, 07/2012  . Pulmonary hypertension ([place])     Past Surgical History:  Procedure Laterality Date  . JOINT REPLACEMENT Right Feb 2015   Dr. Case- Ledell Noss  . TOTAL KNEE ARTHROPLASTY Left     There were no vitals filed for this visit.                  Wound Therapy - 11/02/16 1115    Subjective Pt states her bandage slid down a little after last session.  No pain or issues to report today.   Patient and Family Stated Goals For the wound to heal    Date of Onset 05/06/16   Prior Treatments antibiotics, self-dressing with xeroform/gauze    Pain Assessment No/denies pain   Evaluation and Treatment Procedures Explained to Patient/Family Yes   Evaluation and Treatment Procedures agreed to   Wound Properties Date First Assessed: 08/02/16 Time First Assessed: 1125 Wound Type: Venous stasis ulcer Location: Leg Location Orientation: Right;Lower Wound Description (Comments): R lateral lower LE  Present on Admission: Yes    Dressing Type Gauze (Comment)  light layer of honey then silverhydrofiber with gauze, netti   Dressing Changed Changed   Dressing Status Clean;Dry;Intact   Dressing Change Frequency PRN   Site / Wound Assessment Clean;Dry   % Wound base Red or Granulating 80%   % Wound base Yellow/Fibrinous Exudate 20%   Peri-wound Assessment Edema;Hemosiderin   Wound Length (cm) 5.8 cm   Wound Width (cm) 3 cm   Margins Attached edges (approximated)   Closure None   Drainage Amount Minimal   Drainage Description Serous   Treatment Cleansed;Debridement (Selective)   Selective Debridement - Location wound bed    Selective Debridement - Tools Used Forceps   Selective Debridement - Tissue Removed slough   Wound Therapy - Clinical Statement Continued reduction in length this session, no significant change in width.  Noted maceration perimeter of wound with application of vaseline to protect this area following debridment and cleansing.  Pt able to tolerate debridement well.     Wound Therapy - Functional Problem List difficulty walking and standing due to pain    Factors Delaying/Impairing Wound Healing Infection - systemic/local;Vascular compromise   Hydrotherapy Plan Debridement;Dressing change;Patient/family education   Wound Therapy - Frequency --  2x/week for 4 weeks   Wound Therapy - Current Recommendations PT   Wound Plan debridement, manual and dressing change using  appropriate bandaging conducive to wound environment.  Measure weekly.   Dressing  honey on slough, silver hydrofiber, folllowed by profore since pt has been cleared of any arterial blockages.           Wound Length (cm) 5.8 cm  was 7.2   Wound Width (cm) 3 cm  was 3                 PT Short Term Goals - 09/21/16 1218      PT SHORT TERM GOAL #1   Title Patient to experience pain no more than 5/10 to allow pt to be standing/walking  for 30 mintues in comfort to tolerate job activities.    Time 2   Period Weeks    Status On-going     PT SHORT TERM GOAL #2   Title Wound granulationto have increased by at least 50% to decrease risk of infection.   Time 2   Period Weeks   Status On-going     PT SHORT TERM GOAL #3   Title Pt drainage to be minimal to decrease risk of infection    Time 2   Period Weeks   Status On-going           PT Long Term Goals - 09/21/16 1218      PT LONG TERM GOAL #1   Title  Pain 0/10 to allow pt to be up walking/standing for 2 hours without pain to be able to complete her work activity in comfort.    Time 4   Period Weeks   Status On-going     PT LONG TERM GOAL #2   Title Pt wound to be 100% granulated for reduced risk of infection    Time 4   Period Weeks   Status On-going     PT LONG TERM GOAL #3   Title Wound to have decreased in size to no greater than 1x.5x .5  for pt to feel comfortable with self treatment.    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 PVD (peripheral vascular disease) ([place]) 07/05/2016  . Venous stasis ulcer ([place]) 03/15/2016  . Cellulitis 12/08/2015  . (HFpEF) heart failure with preserved ejection fraction ([place]) 04/16/2013  . Mitral regurgitation   . Anemia   . HTN (hypertension)   . Atrial fibrillation ([place]) 04/02/2009  . DIASTOLIC HEART FAILURE, CHRONIC 04/02/2009   Teena Irani, PTA/CLT [phone]  Teena Irani 11/02/2016, 11:21 AM  [place], [place],  [address] Phone: [phone]   Fax:  [phone]  Name: Jody [name] MRN: [id number] Date of Birth: 1927-06-07

## 2016-11-07 NOTE — Addendum Note (Signed)
Addended by: Hunt Oris on: 11/07/2016 04:57 PM   Modules accepted: Orders

## 2016-11-08 ENCOUNTER — Ambulatory Visit (HOSPITAL_COMMUNITY): Payer: Medicare Other | Admitting: Physical Therapy

## 2016-11-08 DIAGNOSIS — I83013 Varicose veins of right lower extremity with ulcer of ankle: Secondary | ICD-10-CM

## 2016-11-08 DIAGNOSIS — R262 Difficulty in walking, not elsewhere classified: Secondary | ICD-10-CM | POA: Diagnosis not present

## 2016-11-08 DIAGNOSIS — L97319 Non-pressure chronic ulcer of right ankle with unspecified severity: Secondary | ICD-10-CM | POA: Diagnosis not present

## 2016-11-08 DIAGNOSIS — M79661 Pain in right lower leg: Secondary | ICD-10-CM | POA: Diagnosis not present

## 2016-11-08 DIAGNOSIS — L97311 Non-pressure chronic ulcer of right ankle limited to breakdown of skin: Secondary | ICD-10-CM

## 2016-11-08 DIAGNOSIS — M25571 Pain in right ankle and joints of right foot: Secondary | ICD-10-CM | POA: Diagnosis not present

## 2016-11-08 NOTE — Therapy (Signed)
[place], [place], [address] Phone: [phone]   Fax:  [phone]  Wound Care Therapy  Patient Details  Name: Jody [name] MRN: [id number] Date of Birth: 1928-03-27 Referring Provider: Evelina Dun  Encounter Date: 11/08/2016      PT End of Session - 11/08/16 1637    Visit Number 15   Number of Visits 24   Date for PT Re-Evaluation 11/17/16   Authorization Type [employer] medicare cert 0/81-4/48, gcodes completed visit 8   Authorization - Visit Number 15   Authorization - Number of Visits 18   PT Start Time 1030   PT Stop Time 1100   PT Time Calculation (min) 30 min   Activity Tolerance Patient tolerated treatment well;No increased pain   Behavior During Therapy WFL for tasks assessed/performed      Past Medical History:  Diagnosis Date  . Afib ([place])   . Anemia   . CAD (coronary artery disease)   . Chronic diastolic heart failure ([place])   . HTN (hypertension)   . Mitral regurgitation    Moderate, echo, 07/2012  . Pulmonary hypertension ([place])     Past Surgical History:  Procedure Laterality Date  . JOINT REPLACEMENT Right Feb 2015   Dr. Case- Ledell Noss  . TOTAL KNEE ARTHROPLASTY Left     There were no vitals filed for this visit.                  Wound Therapy - 11/08/16 1556    Subjective pt states her leg is feeling better.    Patient and Family Stated Goals For the wound to heal    Date of Onset 05/06/16   Prior Treatments antibiotics, self-dressing with xeroform/gauze    Pain Assessment No/denies pain   Evaluation and Treatment Procedures Explained to Patient/Family Yes   Evaluation and Treatment Procedures agreed to   Wound Properties Date First Assessed: 08/02/16 Time First Assessed: 1125 Wound Type: Venous stasis ulcer Location: Leg Location Orientation: Right;Lower Wound Description (Comments): R lateral lower LE  Present on Admission: Yes   Dressing Type Gauze  (Comment)  light layer of honey then silverhydrofiber with gauze, netti   Dressing Changed Changed   Dressing Status Clean;Dry;Intact   Dressing Change Frequency PRN   Site / Wound Assessment Clean;Dry   % Wound base Red or Granulating 80%   % Wound base Yellow/Fibrinous Exudate 20%   Peri-wound Assessment Edema;Hemosiderin   Wound Length (cm) 5.8 cm   Wound Width (cm) 3 cm   Margins Attached edges (approximated)   Closure None   Drainage Amount Minimal   Drainage Description Serous   Treatment Cleansed;Debridement (Selective)   Selective Debridement - Location wound bed    Selective Debridement - Tools Used Forceps   Selective Debridement - Tissue Removed slough   Wound Therapy - Clinical Statement No significant change from last visit.  Wound remeasured without change.  Pt did report some tenderness most distally but otherwise no pain.  Continued with honey and  compression dressing.    Wound Therapy - Functional Problem List difficulty walking and standing due to pain    Factors Delaying/Impairing Wound Healing Infection - systemic/local;Vascular compromise   Hydrotherapy Plan Debridement;Dressing change;Patient/family education   Wound Therapy - Frequency --  2x/week for 4 weeks   Wound Therapy - Current Recommendations PT   Wound Plan debridement, manual and dressing change using appropriate bandaging conducive to wound environment.  Measure weekly.  Dressing  honey on slough, silver hydrofiber, folllowed by profore since pt has been cleared of any arterial blockages.                     PT Short Term Goals - 09/21/16 1218      PT SHORT TERM GOAL #1   Title Patient to experience pain no more than 5/10 to allow pt to be standing/walking  for 30 mintues in comfort to tolerate job activities.    Time 2   Period Weeks   Status On-going     PT SHORT TERM GOAL #2   Title Wound granulationto have increased by at least 50% to decrease risk of infection.   Time 2    Period Weeks   Status On-going     PT SHORT TERM GOAL #3   Title Pt drainage to be minimal to decrease risk of infection    Time 2   Period Weeks   Status On-going           PT Long Term Goals - 09/21/16 1218      PT LONG TERM GOAL #1   Title  Pain 0/10 to allow pt to be up walking/standing for 2 hours without pain to be able to complete her work activity in comfort.    Time 4   Period Weeks   Status On-going     PT LONG TERM GOAL #2   Title Pt wound to be 100% granulated for reduced risk of infection    Time 4   Period Weeks   Status On-going     PT LONG TERM GOAL #3   Title Wound to have decreased in size to no greater than 1x.5x .5  for pt to feel comfortable with self treatment.    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 PVD (peripheral vascular disease) ([place]) 07/05/2016  . Venous stasis ulcer ([place]) 03/15/2016  . Cellulitis 12/08/2015  . (HFpEF) heart failure with preserved ejection fraction ([place]) 04/16/2013  . Mitral regurgitation   . Anemia   . HTN (hypertension)   . Atrial fibrillation ([place]) 04/02/2009  . DIASTOLIC HEART FAILURE, CHRONIC 04/02/2009   Teena Irani, PTA/CLT [phone]  Teena Irani 11/08/2016, 4:41 PM  [place], [place], [address] Phone: [phone]   Fax:  [phone]  Name: CONSTANCIA GEETING MRN: [id number] Date of Birth: March 30, 1928

## 2016-11-16 ENCOUNTER — Telehealth (HOSPITAL_COMMUNITY): Payer: Self-pay | Admitting: Physical Therapy

## 2016-11-16 ENCOUNTER — Ambulatory Visit (HOSPITAL_COMMUNITY): Payer: Medicare Other | Admitting: Physical Therapy

## 2016-11-16 DIAGNOSIS — M25571 Pain in right ankle and joints of right foot: Secondary | ICD-10-CM | POA: Diagnosis not present

## 2016-11-16 DIAGNOSIS — L97311 Non-pressure chronic ulcer of right ankle limited to breakdown of skin: Secondary | ICD-10-CM

## 2016-11-16 DIAGNOSIS — R262 Difficulty in walking, not elsewhere classified: Secondary | ICD-10-CM

## 2016-11-16 DIAGNOSIS — M79661 Pain in right lower leg: Secondary | ICD-10-CM

## 2016-11-16 DIAGNOSIS — I83013 Varicose veins of right lower extremity with ulcer of ankle: Secondary | ICD-10-CM | POA: Diagnosis not present

## 2016-11-16 DIAGNOSIS — L97319 Non-pressure chronic ulcer of right ankle with unspecified severity: Secondary | ICD-10-CM | POA: Diagnosis not present

## 2016-11-16 NOTE — Therapy (Signed)
[facility], [place], [address] Phone: [phone]   Fax:  [phone]  Wound Care Therapy  Patient Details  Name: Jody [name] MRN: [id number] Date of Birth: Dec 10, 1927 Referring Provider: Evelina Dun  Encounter Date: 11/16/2016      PT End of Session - 11/16/16 1238    Visit Number 16   Number of Visits 24   Date for PT Re-Evaluation 11/17/16   Authorization Type [employer] medicare cert 1/32-4/40, gcodes completed visit 8   Authorization - Visit Number 16   Authorization - Number of Visits 18   PT Start Time 1045   PT Stop Time 1125   PT Time Calculation (min) 40 min   Activity Tolerance Patient tolerated treatment well;No increased pain   Behavior During Therapy WFL for tasks assessed/performed      Past Medical History:  Diagnosis Date  . Afib ([place])   . Anemia   . CAD (coronary artery disease)   . Chronic diastolic heart failure ([place])   . HTN (hypertension)   . Mitral regurgitation    Moderate, echo, 07/2012  . Pulmonary hypertension ([place])     Past Surgical History:  Procedure Laterality Date  . JOINT REPLACEMENT Right Feb 2015   Dr. Case- [facility] Noss  . TOTAL KNEE ARTHROPLASTY Left     There were no vitals filed for this visit.                  Wound Therapy - 11/16/16 1230    Subjective PT states that the profore was placed way to tight last treatment.  Her leg swelled up and she had to cut it off.    Patient and Family Stated Goals For the wound to heal    Date of Onset 05/06/16   Prior Treatments antibiotics, self-dressing with xeroform/gauze    Pain Assessment --  only with debridement.    Evaluation and Treatment Procedures Explained to Patient/Family Yes   Evaluation and Treatment Procedures agreed to   Wound Properties Date First Assessed: 08/02/16 Time First Assessed: 1027 Wound Type: Venous stasis ulcer Location: Leg Location Orientation: Right;Lower Wound  Description (Comments): R lateral lower LE  Present on Admission: Yes   Dressing Type Gauze (Comment)  light layer of honey then silverhydrofiber with gauze, netti   Dressing Changed Changed   Dressing Status Clean;Dry;Intact   Dressing Change Frequency PRN   Site / Wound Assessment Clean;Dry   % Wound base Red or Granulating 85%   % Wound base Yellow/Fibrinous Exudate 15%   Peri-wound Assessment Edema;Hemosiderin   Wound Length (cm) 5.4 cm  was 5.8   Wound Width (cm) 2.2 cm  was 3    Margins Attached edges (approximated)   Closure None   Drainage Amount Minimal   Drainage Description Serosanguineous   Treatment Cleansed;Debridement (Selective)   Wound Properties Date First Assessed: 11/16/16 Time First Assessed: 1052 Wound Type: Other (Comment);Venous stasis ulcer Location: Ankle Location Orientation: Right;Lateral Wound Description (Comments): distal to initial wound  Present on Admission: No   Dressing Type Compression wrap   Dressing Change Frequency PRN   Site / Wound Assessment Clean;Granulation tissue   % Wound base Red or Granulating 100%   Wound Length (cm) 1.5 cm   Wound Width (cm) 2 cm   Wound Depth (cm) 0 cm   Drainage Amount Scant   Treatment Cleansed   Selective Debridement - Location wound bed    Selective Debridement - Tools Used  Forceps   Selective Debridement - Tissue Removed slough   Wound Therapy - Clinical Statement Initially wound had a foul smell which disipated with cleansing. Therapist noted a second wound distal to the wound that she is being seen for.  Her ankle does have increased redness but no swelling or fever.  We will keep an eye on this.  Pt initial wound  is hypergranulating therefore foam was added to the treatment.  Profore system will still be used making sure that it is not put on to tightly.    Wound Therapy - Functional Problem List difficulty walking and standing due to pain    Factors Delaying/Impairing Wound Healing Infection -  systemic/local;Vascular compromise   Hydrotherapy Plan Debridement;Dressing change;Patient/family education   Wound Therapy - Frequency --  2x/week for 4 weeks   Wound Therapy - Current Recommendations PT   Wound Plan debridement, manual and dressing change using appropriate bandaging conducive to wound environment.  Measure weekly.   Dressing  silver hydrofiber to inital larger wound ; medihoney to new wound followed by profore dressing                    PT Short Term Goals - 09/21/16 1218      PT SHORT TERM GOAL #1   Title Patient to experience pain no more than 5/10 to allow pt to be standing/walking  for 30 mintues in comfort to tolerate job activities.    Time 2   Period Weeks   Status On-going     PT SHORT TERM GOAL #2   Title Wound granulationto have increased by at least 50% to decrease risk of infection.   Time 2   Period Weeks   Status On-going     PT SHORT TERM GOAL #3   Title Pt drainage to be minimal to decrease risk of infection    Time 2   Period Weeks   Status On-going           PT Long Term Goals - 09/21/16 1218      PT LONG TERM GOAL #1   Title  Pain 0/10 to allow pt to be up walking/standing for 2 hours without pain to be able to complete her work activity in comfort.    Time 4   Period Weeks   Status On-going     PT LONG TERM GOAL #2   Title Pt wound to be 100% granulated for reduced risk of infection    Time 4   Period Weeks   Status On-going     PT LONG TERM GOAL #3   Title Wound to have decreased in size to no greater than 1x.5x .5  for pt to feel comfortable with self treatment.    Time 4   Period Weeks   Status On-going               Plan - 11/16/16 1239    Clinical Impression Statement as above    Rehab Potential Fair   PT Frequency 2x / week   PT Duration 4 weeks   PT Treatment/Interventions ADLs/Self Care Home Management;Other (comment)  debridement as tolerated    PT Next Visit Plan see above        Patient will benefit from skilled therapeutic intervention in order to improve the following deficits and impairments:  Pain  Visit Diagnosis: Pain in right lower leg  Difficulty in walking, not elsewhere classified  Varicose veins of right lower extremity with ulcer of  ankle (HCC)  Pain in right ankle and joints of right foot  Non-pressure chronic ulcer of right ankle limited to breakdown of skin [facility])     Problem List Patient Active Problem List   Diagnosis Date Noted  . PVD (peripheral vascular disease) ([place]) 07/05/2016  . Venous stasis ulcer ([place]) 03/15/2016  . Cellulitis 12/08/2015  . (HFpEF) heart failure with preserved ejection fraction ([place]) 04/16/2013  . Mitral regurgitation   . Anemia   . HTN (hypertension)   . Atrial fibrillation ([place]) 04/02/2009  . DIASTOLIC HEART FAILURE, CHRONIC 04/02/2009   Rayetta Humphrey, PT CLT [phone] 11/16/2016, 12:39 PM  [facility] [address] [place], [place], [address] Phone: [phone]   Fax:  [phone]  Name: Jody [name] MRN: [id number] Date of Birth: June 08, 1927

## 2016-11-16 NOTE — Telephone Encounter (Signed)
Pt maybe a little late her car is broke down and her daughter is on her way to pick her up and bring her here. per [facility]

## 2016-11-22 ENCOUNTER — Ambulatory Visit (HOSPITAL_COMMUNITY): Payer: Medicare Other | Admitting: Physical Therapy

## 2016-11-22 DIAGNOSIS — L97319 Non-pressure chronic ulcer of right ankle with unspecified severity: Secondary | ICD-10-CM | POA: Diagnosis not present

## 2016-11-22 DIAGNOSIS — M25571 Pain in right ankle and joints of right foot: Secondary | ICD-10-CM

## 2016-11-22 DIAGNOSIS — R262 Difficulty in walking, not elsewhere classified: Secondary | ICD-10-CM

## 2016-11-22 DIAGNOSIS — M79661 Pain in right lower leg: Secondary | ICD-10-CM | POA: Diagnosis not present

## 2016-11-22 DIAGNOSIS — I83013 Varicose veins of right lower extremity with ulcer of ankle: Secondary | ICD-10-CM

## 2016-11-22 DIAGNOSIS — L97311 Non-pressure chronic ulcer of right ankle limited to breakdown of skin: Secondary | ICD-10-CM | POA: Diagnosis not present

## 2016-11-22 NOTE — Therapy (Signed)
[place], [place], [address] Phone: [phone]   Fax:  [phone]  Wound Care Therapy  Patient Details  Name: Jody [name] MRN: [id number] Date of Birth: [birth year] Referring Provider: Evelina Dun  Encounter Date: 11/22/2016      PT End of Session - 11/22/16 1122    Visit Number 17   Number of Visits 24   Date for PT Re-Evaluation 11/17/16   Authorization Type [employer] medicare cert 5/63-8/75, gcodes completed visit 8   Authorization - Visit Number 17   Authorization - Number of Visits 18   PT Start Time 1045   PT Stop Time 1115   PT Time Calculation (min) 30 min   Activity Tolerance Patient tolerated treatment well;No increased pain   Behavior During Therapy WFL for tasks assessed/performed      Past Medical History:  Diagnosis Date  . Afib ([place])   . Anemia   . CAD (coronary artery disease)   . Chronic diastolic heart failure ([place])   . HTN (hypertension)   . Mitral regurgitation    Moderate, echo, 07/2012  . Pulmonary hypertension ([place])     Past Surgical History:  Procedure Laterality Date  . JOINT REPLACEMENT Right Feb 2015   Dr. Case- [facility] Noss  . TOTAL KNEE ARTHROPLASTY Left     There were no vitals filed for this visit.                  Wound Therapy - 11/22/16 1118    Subjective Pt states that she has had very little pain   Patient and Family Stated Goals For the wound to heal    Date of Onset 05/06/16   Prior Treatments antibiotics, self-dressing with xeroform/gauze    Pain Assessment No/denies pain   Evaluation and Treatment Procedures Explained to Patient/Family Yes   Evaluation and Treatment Procedures agreed to   Wound Properties Date First Assessed: 11/16/16 Time First Assessed: 1052 Wound Type: Other (Comment);Venous stasis ulcer Location: Ankle Location Orientation: Right;Lateral Wound Description (Comments): distal to initial wound  Present on Admission:  No Final Assessment Date: 11/22/16 Final Assessment Time: 1045   Wound Properties Date First Assessed: 08/02/16 Time First Assessed: 1125 Wound Type: Venous stasis ulcer Location: Leg Location Orientation: Right;Lower Wound Description (Comments): R lateral lower LE  Present on Admission: Yes   Dressing Type Gauze (Comment)  light layer of honey then profore wound dressing.   Dressing Changed Changed   Dressing Status Clean;Dry;Intact   Dressing Change Frequency PRN   Site / Wound Assessment Clean;Dry   % Wound base Red or Granulating 85%   % Wound base Yellow/Fibrinous Exudate 15%   Peri-wound Assessment Edema;Hemosiderin   Wound Length (cm) 5.5 cm   Wound Width (cm) 2 cm   Margins Attached edges (approximated)   Closure None   Drainage Amount Minimal   Drainage Description Serosanguineous   Treatment Cleansed;Debridement (Selective)   Selective Debridement - Location wound bed    Selective Debridement - Tools Used Forceps   Selective Debridement - Tissue Removed biofilm and slough    Wound Therapy - Clinical Statement Wound that was distal to initial wound is now healed.  Initial wound continues to hypergranulate therefore continued witht the foam for increased compression at wound site.     Wound Therapy - Functional Problem List difficulty walking and standing due to pain    Factors Delaying/Impairing Wound Healing Infection - systemic/local;Vascular compromise   Hydrotherapy Plan Debridement;Dressing  change;Patient/family education   Wound Therapy - Frequency --  2x/week for 4 weeks   Wound Therapy - Current Recommendations PT   Wound Plan debridement, manual and dressing change using appropriate bandaging conducive to wound environment.  Measure weekly.   Dressing  medihoney followed by profore dressing                    PT Short Term Goals - 11/22/16 1123      PT SHORT TERM GOAL #1   Title Patient to experience pain no more than 5/10 to allow pt to be  standing/walking  for 30 mintues in comfort to tolerate job activities.    Time 2   Period Weeks   Status Achieved     PT SHORT TERM GOAL #2   Title Wound granulationto have increased by at least 50% to decrease risk of infection.   Time 2   Period Weeks   Status Achieved     PT SHORT TERM GOAL #3   Title Pt drainage to be minimal to decrease risk of infection    Time 2   Period Weeks   Status Achieved           PT Long Term Goals - 11/22/16 1123      PT LONG TERM GOAL #1   Title  Pain 0/10 to allow pt to be up walking/standing for 2 hours without pain to be able to complete her work activity in comfort.    Time 4   Period Weeks   Status Partially Met     PT LONG TERM GOAL #2   Title Pt wound to be 100% granulated for reduced risk of infection    Time 4   Period Weeks   Status On-going     PT LONG TERM GOAL #3   Title Wound to have decreased in size to no greater than 1x.5x .5  for pt to feel comfortable with self treatment.    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 PVD (peripheral vascular disease) ([place]) 07/05/2016  . Venous stasis ulcer ([place]) 03/15/2016  . Cellulitis 12/08/2015  . (HFpEF) heart failure with preserved ejection fraction  ([place]) 04/16/2013  . Mitral regurgitation   . Anemia   . HTN (hypertension)   . Atrial fibrillation ([place]) 04/02/2009  . DIASTOLIC HEART FAILURE, CHRONIC 04/02/2009    Rayetta Humphrey, PT CLT [phone] 11/22/2016, 11:25 AM  [facility] [address] [place], [place], [address] Phone: [phone]   Fax:  [phone]  Name: Jody [name] MRN: [id number] Date of Birth: 06-08-1927

## 2016-11-30 ENCOUNTER — Ambulatory Visit (HOSPITAL_COMMUNITY): Payer: Medicare Other | Admitting: Physical Therapy

## 2016-11-30 DIAGNOSIS — L97319 Non-pressure chronic ulcer of right ankle with unspecified severity: Secondary | ICD-10-CM | POA: Diagnosis not present

## 2016-11-30 DIAGNOSIS — M25571 Pain in right ankle and joints of right foot: Secondary | ICD-10-CM | POA: Diagnosis not present

## 2016-11-30 DIAGNOSIS — M79661 Pain in right lower leg: Secondary | ICD-10-CM

## 2016-11-30 DIAGNOSIS — I83013 Varicose veins of right lower extremity with ulcer of ankle: Secondary | ICD-10-CM | POA: Diagnosis not present

## 2016-11-30 DIAGNOSIS — L97311 Non-pressure chronic ulcer of right ankle limited to breakdown of skin: Secondary | ICD-10-CM | POA: Diagnosis not present

## 2016-11-30 DIAGNOSIS — R262 Difficulty in walking, not elsewhere classified: Secondary | ICD-10-CM

## 2016-11-30 NOTE — Therapy (Signed)
[place], [place], [address] Phone: [phone]   Fax:  [phone]  Wound Care Therapy  Patient Details  Name: Jody [name] MRN: [id number] Date of Birth: [birth year] Referring Provider: Evelina Dun  Encounter Date: 11/30/2016      PT End of Session - 11/30/16 1121    Visit Number 18   Number of Visits 24   Date for PT Re-Evaluation 11/17/16   Authorization Type [employer] medicare cert8/28 thru 18/84 g code at visit 18   Authorization - Visit Number 18   Authorization - Number of Visits 24   PT Start Time 1660   PT Stop Time 1113   PT Time Calculation (min) 38 min   Activity Tolerance Patient tolerated treatment well;No increased pain   Behavior During Therapy WFL for tasks assessed/performed      Past Medical History:  Diagnosis Date  . Afib ([place])   . Anemia   . CAD (coronary artery disease)   . Chronic diastolic heart failure ([place])   . HTN (hypertension)   . Mitral regurgitation    Moderate, echo, 07/2012  . Pulmonary hypertension ([place])     Past Surgical History:  Procedure Laterality Date  . JOINT REPLACEMENT Right Feb 2015   Dr. Case- Ledell Noss  . TOTAL KNEE ARTHROPLASTY Left     There were no vitals filed for this visit.                  Wound Therapy - 11/30/16 1117    Subjective Pt states that the dressing is still to tight and her toes are going numb.   Patient and Family Stated Goals For the wound to heal    Date of Onset 05/06/16   Prior Treatments antibiotics, self-dressing with xeroform/gauze    Pain Assessment No/denies pain  except when debriding =6/10   Evaluation and Treatment Procedures Explained to Patient/Family Yes   Evaluation and Treatment Procedures agreed to   Wound Properties Date First Assessed: 08/02/16 Time First Assessed: 1125 Wound Type: Venous stasis ulcer Location: Leg Location Orientation: Right;Lower Wound Description (Comments): R  lateral lower LE  Present on Admission: Yes   Dressing Type Gauze (Comment)   Dressing Changed Changed   Dressing Status Clean;Dry;Intact   Dressing Change Frequency PRN   Site / Wound Assessment Clean;Dry   % Wound base Red or Granulating 85%  was 80% on 8/6    % Wound base Yellow/Fibrinous Exudate 15%   Peri-wound Assessment Edema;Hemosiderin   Wound Length (cm) 5 cm  was 5.8 on 8/6   Wound Width (cm) 2 cm  sup=2; middle=1.5; inferior =.8  on  11/07/2016 wound width 3   Margins Attached edges (approximated)   Closure None   Drainage Amount Minimal   Drainage Description Serosanguineous   Treatment Cleansed;Debridement (Selective)   Selective Debridement - Location wound bed    Selective Debridement - Tools Used Forceps   Selective Debridement - Tissue Removed biofilm and slough    Wound Therapy - Clinical Statement Superior aspect of wound remains hypergranulated.  Kept foam on this area only.  Decreased to profore lite vs. profore.to attempt to ease comfort of compression dressing.  Changed dressing to impregnated hydrogel    Wound Therapy - Functional Problem List difficulty walking and standing due to pain    Factors Delaying/Impairing Wound Healing Infection - systemic/local;Vascular compromise   Hydrotherapy Plan Debridement;Dressing change;Patient/family education   Wound Therapy - Frequency --  2x/week  for 4 weeks   Wound Therapy - Current Recommendations PT   Wound Plan debridement, manual and dressing change using appropriate bandaging conducive to wound environment.  Measure weekly.   Dressing  hydrogel gauze, foam, compression bandage system                    PT Short Term Goals - 2016/12/18 1131      PT SHORT TERM GOAL #1   Title Patient to experience pain no more than 5/10 to allow pt to be standing/walking  for 30 mintues in comfort to tolerate job activities.    Time 2   Period Weeks   Status Achieved     PT SHORT TERM GOAL #2   Title Wound  granulationto have increased by at least 50% to decrease risk of infection.   Time 2   Period Weeks   Status Achieved     PT SHORT TERM GOAL #3   Title Pt drainage to be minimal to decrease risk of infection    Time 2   Period Weeks   Status Achieved           PT Long Term Goals - 12/18/2016 1131      PT LONG TERM GOAL #1   Title  Pain 0/10 to allow pt to be up walking/standing for 2 hours without pain to be able to complete her work activity in comfort.    Time 4   Period Weeks   Status Partially Met     PT LONG TERM GOAL #2   Title Pt wound to be 100% granulated for reduced risk of infection    Time 4   Period Weeks   Status On-going     PT LONG TERM GOAL #3   Title Wound to have decreased in size to no greater than 1x.5x .5  for pt to feel comfortable with self treatment.    Time 4   Period Weeks   Status On-going               Plan - 12-18-2016 1122    Clinical Impression Statement as above    Rehab Potential Fair   PT Frequency 2x / week   PT Duration 4 weeks   PT Treatment/Interventions ADLs/Self Care Home Management;Other (comment)  debridement as tolerated    PT Next Visit Plan Continue to measure wound each time pt comes in.        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 PVD (peripheral vascular disease) ([place]) 07/05/2016  . Venous stasis ulcer ([place]) 03/15/2016  . Cellulitis 12/08/2015  . (HFpEF) heart failure with preserved ejection fraction ([place]) 04/16/2013  .  Mitral regurgitation   . Anemia   . HTN (hypertension)   . Atrial fibrillation ([place]) 04/02/2009  . DIASTOLIC HEART FAILURE, CHRONIC 04/02/2009    Rayetta Humphrey, PT CLT [phone] 11/30/2016, 11:33 AM  [facility] [address] [place], [place], [address] Phone: [phone]   Fax:  [phone]  Name: KHAILA VELARDE MRN: [id number] Date of Birth: 11-30-1927

## 2016-12-06 ENCOUNTER — Ambulatory Visit (HOSPITAL_COMMUNITY): Payer: Medicare Other | Admitting: Physical Therapy

## 2016-12-09 ENCOUNTER — Ambulatory Visit (HOSPITAL_COMMUNITY): Payer: Medicare Other | Attending: Family

## 2016-12-09 ENCOUNTER — Telehealth: Payer: Self-pay | Admitting: Family

## 2016-12-09 DIAGNOSIS — I83013 Varicose veins of right lower extremity with ulcer of ankle: Secondary | ICD-10-CM

## 2016-12-09 DIAGNOSIS — M79661 Pain in right lower leg: Secondary | ICD-10-CM | POA: Diagnosis not present

## 2016-12-09 DIAGNOSIS — L97319 Non-pressure chronic ulcer of right ankle with unspecified severity: Secondary | ICD-10-CM | POA: Diagnosis not present

## 2016-12-09 DIAGNOSIS — R262 Difficulty in walking, not elsewhere classified: Secondary | ICD-10-CM | POA: Insufficient documentation

## 2016-12-09 DIAGNOSIS — M25571 Pain in right ankle and joints of right foot: Secondary | ICD-10-CM

## 2016-12-09 DIAGNOSIS — L97311 Non-pressure chronic ulcer of right ankle limited to breakdown of skin: Secondary | ICD-10-CM | POA: Insufficient documentation

## 2016-12-09 NOTE — Telephone Encounter (Signed)
Patient just left wound center and they told her to call here and get you to call in abx. Just so it does not take infection. Patient just had it cleaned and dressed. Patient uses [facility] Please advise

## 2016-12-09 NOTE — Therapy (Signed)
[place], [place], [address] Phone: [phone]   Fax:  [phone]  Wound Care Therapy  Patient Details  Name: Jody [name] MRN: [id number] Date of Birth: 03-24-1928 Referring Provider: Evelina Dun  Encounter Date: 12/09/2016      PT End of Session - 12/09/16 1250    Visit Number 19   Number of Visits 24   Date for PT Re-Evaluation 11/17/16   Authorization Type [employer] medicare cert8/28 thru 75/64, g code at visit 18   Authorization - Visit Number 19   Authorization - Number of Visits 24   PT Start Time 3329   PT Stop Time 1115   PT Time Calculation (min) 40 min   Activity Tolerance Patient tolerated treatment well;No increased pain   Behavior During Therapy WFL for tasks assessed/performed      Past Medical History:  Diagnosis Date  . Afib ([place])   . Anemia   . CAD (coronary artery disease)   . Chronic diastolic heart failure ([place])   . HTN (hypertension)   . Mitral regurgitation    Moderate, echo, 07/2012  . Pulmonary hypertension ([place])     Past Surgical History:  Procedure Laterality Date  . JOINT REPLACEMENT Right Feb 2015   Dr. Case- Ledell Noss  . TOTAL KNEE ARTHROPLASTY Left     There were no vitals filed for this visit.       Subjective Assessment - 12/09/16 1225    Subjective Pt arrived with reports of increased pain 10/10 and unable to put on shoes.   Currently in Pain? Yes   Pain Score 10-Worst pain ever                   Wound Therapy - 12/09/16 1225    Subjective Pt arrived with reports of increased pain 10/10 and unable to put on shoes.   Patient and Family Stated Goals For the wound to heal    Date of Onset 05/06/16   Prior Treatments antibiotics, self-dressing with xeroform/gauze    Pain Assessment 0-10   Pain Type Chronic pain   Pain Location Ankle   Pain Orientation Right;Left   Pain Descriptors / Indicators Aching;Jabbing   Pain Onset On-going   Patients Stated Pain Goal 0   Pain Intervention(s) Medication (See eMAR)   Multiple Pain Sites No   Evaluation and Treatment Procedures Explained to Patient/Family Yes   Evaluation and Treatment Procedures agreed to   Wound Properties Date First Assessed: 08/02/16 Time First Assessed: 1125 Wound Type: Venous stasis ulcer Location: Leg Location Orientation: Right;Lower Wound Description (Comments): R lateral lower LE  Present on Admission: Yes   Dressing Type Silver hydrofiber;Compression wrap  profore lite   Dressing Changed Changed   Dressing Status Clean;Dry;Intact   Dressing Change Frequency PRN   Site / Wound Assessment Clean;Dry   % Wound base Red or Granulating 85%   % Wound base Yellow/Fibrinous Exudate 15%   Peri-wound Assessment Edema;Hemosiderin   Wound Length (cm) 7.5 cm  was 5cm last session   Wound Width (cm) 2.2 cm  was 2 cm   Wound Depth (cm) 0 cm  was .4 cm   Margins Attached edges (approximated)   Closure None   Drainage Amount Minimal   Drainage Description Serosanguineous   Treatment Cleansed;Debridement (Selective)   Selective Debridement - Location wound bed    Selective Debridement - Tools Used Forceps   Selective Debridement - Tissue Removed biofilm and slough  Wound Therapy - Clinical Statement Pt arrived with reports of increased pain following last session.  Upon removal of dressings noted increased drainage and redness perimeter of wound.  Increased size of wound with no depth or hypergranulation tissues noted.  Changed dressings to silver hydrofiber to address drainage and continued with profore lite compression for edema control.  Pt encouraged to call MD and request antibiotics.     Wound Therapy - Functional Problem List difficulty walking and standing due to pain    Factors Delaying/Impairing Wound Healing Infection - systemic/local;Vascular compromise   Hydrotherapy Plan Debridement;Dressing change;Patient/family education   Wound Therapy -  Frequency --  2x/week   Wound Therapy - Current Recommendations PT   Wound Plan debridement, manual and dressing change using appropriate bandaging conducive to wound environment.  Measure weekly.   Dressing  silver hydrofiber, vaseline and profore lite                   PT Short Term Goals - 11/30/16 1131      PT SHORT TERM GOAL #1   Title Patient to experience pain no more than 5/10 to allow pt to be standing/walking  for 30 mintues in comfort to tolerate job activities.    Time 2   Period Weeks   Status Achieved     PT SHORT TERM GOAL #2   Title Wound granulationto have increased by at least 50% to decrease risk of infection.   Time 2   Period Weeks   Status Achieved     PT SHORT TERM GOAL #3   Title Pt drainage to be minimal to decrease risk of infection    Time 2   Period Weeks   Status Achieved           PT Long Term Goals - 11/30/16 1131      PT LONG TERM GOAL #1   Title  Pain 0/10 to allow pt to be up walking/standing for 2 hours without pain to be able to complete her work activity in comfort.    Time 4   Period Weeks   Status Partially Met     PT LONG TERM GOAL #2   Title Pt wound to be 100% granulated for reduced risk of infection    Time 4   Period Weeks   Status On-going     PT LONG TERM GOAL #3   Title Wound to have decreased in size to no greater than 1x.5x .5  for pt to feel comfortable with self treatment.    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 PVD (peripheral vascular disease) ([place]) 07/05/2016  . Venous  stasis ulcer ([place]) 03/15/2016  . Cellulitis 12/08/2015  . (HFpEF) heart failure with preserved ejection fraction ([place]) 04/16/2013  . Mitral regurgitation   . Anemia   . HTN (hypertension)   . Atrial fibrillation ([place]) 04/02/2009  . DIASTOLIC HEART FAILURE, CHRONIC 04/02/2009   Ihor Austin, [place]; [place]' Aldona Lento 12/09/2016, 12:54  PM  [place], [place], [address] Phone: [phone]   Fax:  [phone]  Name: CHERYEL KYTE MRN: [id number] Date of Birth: 11-26-1927

## 2016-12-12 MED ORDER — SULFAMETHOXAZOLE-TRIMETHOPRIM 800-160 MG PO TABS: 1.0000 | ORAL_TABLET | Freq: Two times a day (BID) | ORAL | 0 refills | Status: DC

## 2016-12-12 NOTE — Telephone Encounter (Signed)
Please advise 

## 2016-12-12 NOTE — Telephone Encounter (Signed)
Bactrim Prescription sent to pharmacy  °

## 2016-12-14 ENCOUNTER — Ambulatory Visit (HOSPITAL_COMMUNITY): Payer: Medicare Other

## 2016-12-14 DIAGNOSIS — R262 Difficulty in walking, not elsewhere classified: Secondary | ICD-10-CM

## 2016-12-14 DIAGNOSIS — L97319 Non-pressure chronic ulcer of right ankle with unspecified severity: Secondary | ICD-10-CM

## 2016-12-14 DIAGNOSIS — I83013 Varicose veins of right lower extremity with ulcer of ankle: Secondary | ICD-10-CM

## 2016-12-14 DIAGNOSIS — M25571 Pain in right ankle and joints of right foot: Secondary | ICD-10-CM | POA: Diagnosis not present

## 2016-12-14 DIAGNOSIS — L97311 Non-pressure chronic ulcer of right ankle limited to breakdown of skin: Secondary | ICD-10-CM | POA: Diagnosis not present

## 2016-12-14 DIAGNOSIS — M79661 Pain in right lower leg: Secondary | ICD-10-CM

## 2016-12-14 NOTE — Telephone Encounter (Signed)
No voice mail or answer at home number.   Left details of antibiotic on daughter's voice mail.

## 2016-12-15 ENCOUNTER — Encounter: Payer: Self-pay | Admitting: Family

## 2016-12-15 ENCOUNTER — Ambulatory Visit (INDEPENDENT_AMBULATORY_CARE_PROVIDER_SITE_OTHER): Payer: Medicare Other | Admitting: Family

## 2016-12-15 VITALS — BP 122/67 | HR 85 | Temp 97.0°F | Ht 62.0 in | Wt 114.0 lb

## 2016-12-15 DIAGNOSIS — I83018 Varicose veins of right lower extremity with ulcer other part of lower leg: Secondary | ICD-10-CM

## 2016-12-15 DIAGNOSIS — Z96653 Presence of artificial knee joint, bilateral: Secondary | ICD-10-CM | POA: Diagnosis not present

## 2016-12-15 DIAGNOSIS — I739 Peripheral vascular disease, unspecified: Secondary | ICD-10-CM

## 2016-12-15 DIAGNOSIS — M79604 Pain in right leg: Secondary | ICD-10-CM

## 2016-12-15 DIAGNOSIS — L97301 Non-pressure chronic ulcer of unspecified ankle limited to breakdown of skin: Secondary | ICD-10-CM

## 2016-12-15 DIAGNOSIS — I83003 Varicose veins of unspecified lower extremity with ulcer of ankle: Secondary | ICD-10-CM

## 2016-12-15 DIAGNOSIS — L97811 Non-pressure chronic ulcer of other part of right lower leg limited to breakdown of skin: Secondary | ICD-10-CM

## 2016-12-15 DIAGNOSIS — M5410 Radiculopathy, site unspecified: Secondary | ICD-10-CM | POA: Diagnosis not present

## 2016-12-15 MED ORDER — TRAMADOL HCL 50 MG PO TABS: 50.0000 mg | ORAL_TABLET | Freq: Every evening | ORAL | 5 refills | Status: DC | PRN

## 2016-12-15 NOTE — Therapy (Signed)
[place], [place], [address] Phone: [phone]   Fax:  [phone]  Wound Care Therapy  Patient Details  Name: Jody [name] MRN: [id number] Date of Birth: 1927/07/02 Referring Provider: Evelina Dun  Encounter Date: 12/14/2016      PT End of Session - 12/14/16 1303    Visit Number 20   Number of Visits 24   Date for PT Re-Evaluation 01/16/17   Authorization Type [employer] medicare cert8/28 thru 65/46, g code at visit 18   Authorization - Visit Number 20   Authorization - Number of Visits 28   PT Start Time 1025   PT Stop Time 1112   PT Time Calculation (min) 47 min   Activity Tolerance Patient tolerated treatment well;Patient limited by pain   Behavior During Therapy [facility] for tasks assessed/performed      Past Medical History:  Diagnosis Date  . Afib ([place])   . Anemia   . CAD (coronary artery disease)   . Chronic diastolic heart failure ([place])   . HTN (hypertension)   . Mitral regurgitation    Moderate, echo, 07/2012  . Pulmonary hypertension ([place])     Past Surgical History:  Procedure Laterality Date  . JOINT REPLACEMENT Right Feb 2015   Dr. Case- Ledell Noss  . TOTAL KNEE ARTHROPLASTY Left     There were no vitals filed for this visit.       Subjective Assessment - 12/14/16 1252    Subjective Pt reports pain scale 10/10 and unable to wear shoes.  Dressings intact                   Wound Therapy - 12/14/16 1252    Subjective Pt reports pain scale 10/10 and unable to wear shoes.  Dressings intact  Reports she called MD about antibiotics, has apt tomorrow to discuss and get antibiotics if needed.     Patient and Family Stated Goals For the wound to heal    Date of Onset 05/06/16   Prior Treatments antibiotics, self-dressing with xeroform/gauze    Pain Assessment 0-10   Pain Score 10-Worst pain ever   Pain Type Chronic pain   Pain Location Ankle   Pain Orientation  Right;Left   Pain Descriptors / Indicators Aching;Constant   Pain Onset On-going   Patients Stated Pain Goal 0   Pain Intervention(s) Medication (See eMAR);Emotional support   Multiple Pain Sites No   Evaluation and Treatment Procedures Explained to Patient/Family Yes   Evaluation and Treatment Procedures agreed to   Wound Properties Date First Assessed: 08/02/16 Time First Assessed: 1125 Wound Type: Venous stasis ulcer Location: Leg Location Orientation: Right;Lower Wound Description (Comments): R lateral lower LE  Present on Admission: Yes   Dressing Type Alginate;Gauze (Comment);Compression wrap  medihoney, alginate, profore lite   Dressing Changed Changed   Dressing Status Clean;Dry;Intact   Dressing Change Frequency PRN   Site / Wound Assessment Clean;Dry   % Wound base Red or Granulating 85%   % Wound base Yellow/Fibrinous Exudate 15%   Peri-wound Assessment Edema;Hemosiderin   Wound Length (cm) 7.5 cm  was 2.5cm on 08/02/16   Wound Width (cm) 3 cm  was 2 cm on 08/02/16   Wound Depth (cm) 0 cm   Wound Volume (cm^3) 0 cm^3   Wound Surface Area (cm^2) 22.5 cm^2   Margins Attached edges (approximated)   Closure None   Drainage Amount Minimal   Drainage Description Serosanguineous   Treatment  Cleansed;Debridement (Selective)   Selective Debridement - Location wound bed    Selective Debridement - Tools Used Forceps;Scalpel   Selective Debridement - Tissue Removed biofilm and slough    Wound Therapy - Clinical Statement Pt continues to be limited by pain with inability to wear shoes and reports of increased pain since changed from honey.  Limited selective debridement due to pain for removal of slough/biofilm.  Following discussion with evaluation therapist changed dressings back to honey with alginate for drainage.  Wound overall size has increased in width, no depth today.  Pt with plans to see MD tomorrow.    Wound Therapy - Functional Problem List difficulty walking and standing  due to pain    Factors Delaying/Impairing Wound Healing Infection - systemic/local;Vascular compromise   Hydrotherapy Plan Debridement;Dressing change;Patient/family education   Wound Therapy - Frequency --  2x/week   Wound Therapy - Current Recommendations PT   Wound Plan debridement, manual and dressing change using appropriate bandaging conducive to wound environment.  Measure weekly.   Dressing  medihoney with alginate, 4x4 and profore lite                   PT Short Term Goals - 11/30/16 1131      PT SHORT TERM GOAL #1   Title Patient to experience pain no more than 5/10 to allow pt to be standing/walking  for 30 mintues in comfort to tolerate job activities.    Time 2   Period Weeks   Status Achieved     PT SHORT TERM GOAL #2   Title Wound granulationto have increased by at least 50% to decrease risk of infection.   Time 2   Period Weeks   Status Achieved     PT SHORT TERM GOAL #3   Title Pt drainage to be minimal to decrease risk of infection    Time 2   Period Weeks   Status Achieved           PT Long Term Goals - 11/30/16 1131      PT LONG TERM GOAL #1   Title  Pain 0/10 to allow pt to be up walking/standing for 2 hours without pain to be able to complete her work activity in comfort.    Time 4   Period Weeks   Status Partially Met     PT LONG TERM GOAL #2   Title Pt wound to be 100% granulated for reduced risk of infection    Time 4   Period Weeks   Status On-going     PT LONG TERM GOAL #3   Title Wound to have decreased in size to no greater than 1x.5x .5  for pt to feel comfortable with self treatment.    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 PVD (peripheral vascular disease) ([place]) 07/05/2016  . Venous stasis ulcer ([place]) 03/15/2016  . Cellulitis 12/08/2015  . (HFpEF) heart failure with preserved ejection fraction ([place]) 04/16/2013  . Mitral  regurgitation   . Anemia   . HTN (hypertension)   . Atrial fibrillation ([place]) 04/02/2009  . DIASTOLIC HEART FAILURE, CHRONIC 04/02/2009  Ihor Austin  PTA [place], PT CLT [phone] 12/15/2016, 8:26 AM  [facility] [address] [place], [place], [address] Phone: [phone]   Fax:  [phone]  Name: Jody [name] MRN: [id number] Date of Birth: Feb 04, 1928

## 2016-12-15 NOTE — Patient Instructions (Signed)
Venous Ulcer A venous ulcer is a shallow sore on your lower leg that is caused by poor circulation in your veins. This condition used to be called stasis ulcer. Veins have valves that help return blood to the heart. If these valves do not work properly, it can cause blood to flow backward and to back up into the veins near the skin. When that happens, blood can pool in your lower legs. The blood can then leak out of your veins, which can irritate your skin. This may cause a break in your skin that becomes a venous ulcer. Venous ulcer is the most common type of lower leg ulcer. You may have venous ulcers on one leg or on both legs. The area where this condition most commonly develops is around the ankles. A venous ulcer may last for a long time (chronic ulcer) or it may return repeatedly (recurrent ulcer). What are the causes? Any condition that causes poor circulation to your legs can lead to a venous ulcer. What increases the risk? This condition is more likely to develop in:  People who are 65 years of age or older.  People who are overweight.  People who are not active.  People who have had a leg ulcer in the past.  People who have clots in their lower leg veins (deep vein thrombosis).  People who have inflammation of their leg veins (phlebitis).  Women who have given birth.  People who smoke.  What are the signs or symptoms? The most common symptom of this condition is an open sore near your ankle. Other symptoms may include:  Swelling.  Thickening of the skin.  Fluid leaking from the ulcer.  Bleeding.  Itching.  Pain and swelling that gets worse when you stand up and feels better when you raise your leg.  Blotchy skin.  Darkening of the skin.  How is this diagnosed? Your health care provider may suspect a venous ulcer based on your medical history and your risk factors. Your health care provider will check the skin on your legs. Other tests may be done to learn more  about the ulcer and to determine the best way to treat it. Tests that may be done include:  Measuring the blood pressure in your arms and legs.  Using sound waves (ultrasound) to measure the blood flow in your leg veins.  How is this treated? You may need to try several different types of treatment to get your venous ulcer to heal. Healing may take a long time. Treatment may include:  Keeping your leg raised (elevated).  Wearing a type of bandage or stocking to compress the veins of your leg (compression therapy). Venous wounds are not likely to heal or to stay healed without compression.  Taking medicines to improve blood flow.  Taking antibiotic medicines to treat infection.  Cleaning your ulcer and removing any dead tissue from the wound (debridement).  Placing various types of medicated bandage (dressings) or medicated wraps on your ulcer. This helps the ulcer to heal and helps to prevent infection.  Surgery is sometimes needed to close the wound using a piece of skin taken from another area of your body (graft). You may need surgery if other treatments are not working or if your ulcer is very deep. Follow these instructions at home: Wound care  Follow instructions from your health care provider about: ? How to take care of your wound. ? When and how you should change your bandage (dressing). ? When you should   remove your dressing. If your dressing is dry and sticks to your leg when you try to remove it, moisten or wet the dressing with saline solution or water so that the dressing can be removed without harming your skin or wound tissue.  Check your wound every day for signs of infection. Have a caregiver do this for you if you are not able to do it yourself. Check for: ? More redness, swelling, or pain. ? More fluid or blood. ? Pus, warmth, or a bad smell. Medicines  Take over-the-counter and prescription medicines only as told by your health care provider.  If you were  prescribed an antibiotic medicine, take it or apply it as told by your health care provider. Do not stop taking or using the antibiotic even if your condition improves. Activity  Do not stand or sit in one position for a long period of time. Rest with your legs raised during the day. If possible, keep your legs above your heart for 30 minutes, 3-4 times a day, or as told by your health care provider.  Do not sit with your legs crossed.  Walk often to increase the blood flow in your legs.Ask your health care provider what level of activity is safe for you.  If you are taking a long ride in a car or plane, take a break to walk around at least once every two hours, or as often as your health care provider recommends. Ask your health care provider if you should take aspirin before long trips. General instructions   Wear elastic stockings, compression stockings, or support hose as told by your health care provider. This is very important.  Raise the foot of your bed as told by your health care provider.  Do not smoke.  Keep all follow-up visits as told by your health care provider. This is important. Contact a health care provider if:  You have a fever.  Your ulcer is getting larger or is not healing.  Your pain gets worse.  You have more redness or swelling around your ulcer.  You have more fluid, blood, or pus coming from your ulcer after it has been cleaned by you or your health care provider.  You have warmth or a bad smell coming from your ulcer. This information is not intended to replace advice given to you by your health care provider. Make sure you discuss any questions you have with your health care provider. Document Released: 12/14/2000 Document Revised: 08/27/2015 Document Reviewed: 07/30/2014 Elsevier Interactive Patient Education  2018 Elsevier Inc.  

## 2016-12-15 NOTE — Progress Notes (Signed)
   Subjective:    Patient ID: Jody [name], female    DOB: 04/17/1927, 83 y.o.   MRN: [id number]  HPI Pt presents to the office today with recurrent venous statis ulcer that she is followed by wound care weekly. Pt was there yesterday and had wound cleaned and dressed. Pt requests we leave dressing intact today. Pt states she has intermittent soreness of 2 out 10.   Pt states her wound was doing better when she was applying Medihoney Gel to her leg, but two weeks ago they stopped and started using a "new cream" and now her leg is worse.   Pt has had positive culture of wound and was started on antibiotics at that time. She saw  ID in the past and was told to elevate her legs.   Pt had called office earlier in week and asked for antibiotic since wound was worsening. Bactrim rx was sent to pharmacy, but pt did not pick up rx. Pt states she will start today.    Review of Systems  Skin: Positive for wound.  All other systems reviewed and are negative.      Objective:   Physical Exam  Constitutional: She is oriented to person, place, and time. She appears well-developed and well-nourished. No distress.  HENT:  Head: Normocephalic.  Cardiovascular: Normal rate, regular rhythm and intact distal pulses.   Murmur heard. Pulmonary/Chest: Effort normal and breath sounds normal. No respiratory distress. She has no wheezes.  Abdominal: Soft. Bowel sounds are normal. She exhibits no distension. There is no tenderness.  Musculoskeletal: Normal range of motion. She exhibits no edema or tenderness.  Neurological: She is alert and oriented to person, place, and time.  Skin: Skin is warm and dry.  Left lower leg has dressing intact, no edema present  Psychiatric: She has a normal mood and affect. Her behavior is normal. Judgment and thought content normal.  Vitals reviewed.    BP (!) 147/80   Pulse 71   Temp (!) 97 F (36.1 C) (Oral)   Ht 5\' 2"  (1.575 m)   Wt 114 lb (51.7 kg)   BMI 20.85  kg/m      Assessment & Plan:  1. Venous stasis ulcer of ankle limited to breakdown of skin with varicose veins, unspecified laterality ([place]) Continue wound care Keep elevated Keep clean and dry PT start bactrim  RTO prn and keep chronic follow upu - traMADol (ULTRAM) 50 MG tablet; Take 1-2 tablets (50-100 mg total) by mouth at bedtime as needed.  Dispense: 60 tablet; Refill: 5  3. PVD (peripheral vascular disease) (HCC) - traMADol (ULTRAM) 50 MG tablet; Take 1-2 tablets (50-100 mg total) by mouth at bedtime as needed.  Dispense: 60 tablet; Refill: 5  4. Pain of right lower extremity - traMADol (ULTRAM) 50 MG tablet; Take 1-2 tablets (50-100 mg total) by mouth at bedtime as needed.  Dispense: 60 tablet; Refill: Harvey Cedars, FNP

## 2016-12-19 ENCOUNTER — Ambulatory Visit (HOSPITAL_COMMUNITY): Payer: Medicare Other | Admitting: Physical Therapy

## 2016-12-19 ENCOUNTER — Encounter (HOSPITAL_COMMUNITY): Payer: Self-pay | Admitting: Physical Therapy

## 2016-12-19 DIAGNOSIS — M79661 Pain in right lower leg: Secondary | ICD-10-CM

## 2016-12-19 DIAGNOSIS — L97319 Non-pressure chronic ulcer of right ankle with unspecified severity: Secondary | ICD-10-CM | POA: Diagnosis not present

## 2016-12-19 DIAGNOSIS — I83013 Varicose veins of right lower extremity with ulcer of ankle: Secondary | ICD-10-CM | POA: Diagnosis not present

## 2016-12-19 DIAGNOSIS — M25571 Pain in right ankle and joints of right foot: Secondary | ICD-10-CM

## 2016-12-19 DIAGNOSIS — L97311 Non-pressure chronic ulcer of right ankle limited to breakdown of skin: Secondary | ICD-10-CM | POA: Diagnosis not present

## 2016-12-19 DIAGNOSIS — R262 Difficulty in walking, not elsewhere classified: Secondary | ICD-10-CM

## 2016-12-19 NOTE — Therapy (Signed)
[place], [place], [address] Phone: [phone]   Fax:  [phone]  Wound Care Therapy  Patient Details  Name: Jody [name] MRN: [id number] Date of Birth: 12-Aug-1927 Referring Provider: Evelina [name]  Encounter Date: 12/19/2016      PT End of Session - 12/19/16 1402    Visit Number 21   Number of Visits 24   Date for PT Re-Evaluation 01/16/17   Authorization Type [employer] medicare cert8/28 thru 27/78, g code at visit 18   Authorization - Visit Number 21   Authorization - Number of Visits 28   PT Start Time 1030   PT Stop Time 1115   PT Time Calculation (min) 45 min   Activity Tolerance Patient tolerated treatment well;Patient limited by pain   Behavior During Therapy [facility] for tasks assessed/performed      Past Medical History:  Diagnosis Date  . Afib ([place])   . Anemia   . CAD (coronary artery disease)   . Chronic diastolic heart failure ([place])   . HTN (hypertension)   . Mitral regurgitation    Moderate, echo, 07/2012  . Pulmonary hypertension ([place])     Past Surgical History:  Procedure Laterality Date  . JOINT REPLACEMENT Right Feb 2015   Dr. Case- Ledell Noss  . TOTAL KNEE ARTHROPLASTY Left     There were no vitals filed for this visit.                  Wound Therapy - 12/19/16 1350    Subjective Pt states she continues to have significant pain.  Per chart review pt had an antibiotic called in a week ago but did not go fill it.  Daughter states that they have picked it up and she is now taking the antibiotic.    Patient and Family Stated Goals For the wound to heal    Date of Onset 05/06/16   Prior Treatments antibiotics, self-dressing with xeroform/gauze    Pain Assessment 0-10   Pain Score 9    Pain Type Chronic pain   Pain Location Ankle   Pain Orientation Right   Pain Descriptors / Indicators Constant   Pain Onset On-going   Patients Stated Pain Goal 0   Pain  Intervention(s) Medication (See eMAR)   Evaluation and Treatment Procedures Explained to Patient/Family Yes   Evaluation and Treatment Procedures agreed to   Wound Properties Date First Assessed: 08/02/16 Time First Assessed: 1125 Wound Type: Venous stasis ulcer Location: Leg Location Orientation: Right;Lower Wound Description (Comments): R lateral lower LE  Present on Admission: Yes   Dressing Type Alginate;Compression wrap  honeyalginate prior to alginate abpad and profore.    Dressing Status Clean;Dry;Intact   Dressing Change Frequency PRN   Site / Wound Assessment Clean;Dry   % Wound base Red or Granulating 15%   % Wound base Yellow/Fibrinous Exudate 85%   Peri-wound Assessment Edema;Hemosiderin   Wound Length (cm) 10 cm  increased to 7.5    Wound Width (cm) 4 cm  increased from 3   Wound Surface Area (cm^2) 40 cm^2   Margins Attached edges (approximated)   Closure None   Drainage Amount Copious   Drainage Description Serosanguineous   Treatment Cleansed   Wound Therapy - Clinical Statement Unable to debride due to pt intolerance.  Pt had cut a hole in the profore at the wound area.  Once again educated pt that she is to take the profore off one layer  at a time not cut holes or cut the profore as this could be detrimental.  Pt verbalizes understanding.  Wound has grown and has decreased in granulation but at this time pt is so tender the thereapist is unable to debride.  Therapist gentle cleansed the area followed by application of honey alginate to attempt to improve granulation. Pt has began antibiotics hopefully she wont be as tender next visit and we will be able to debride wound bed.    Wound Therapy - Functional Problem List difficulty walking and standing due to pain    Factors Delaying/Impairing Wound Healing Infection - systemic/local;Vascular compromise   Hydrotherapy Plan Debridement;Dressing change;Patient/family education   Wound Therapy - Frequency --  2x/week   Wound  Therapy - Current Recommendations PT   Wound Plan debridement, manual and dressing change using appropriate bandaging conducive to wound environment.  Measure weekly.   Dressing  medihoney alginate with alginate, 4x4 and profore lite                   PT Short Term Goals - 11/30/16 1131      PT SHORT TERM GOAL #1   Title Patient to experience pain no more than 5/10 to allow pt to be standing/walking  for 30 mintues in comfort to tolerate job activities.    Time 2   Period Weeks   Status Achieved     PT SHORT TERM GOAL #2   Title Wound granulationto have increased by at least 50% to decrease risk of infection.   Time 2   Period Weeks   Status Achieved     PT SHORT TERM GOAL #3   Title Pt drainage to be minimal to decrease risk of infection    Time 2   Period Weeks   Status Achieved           PT Long Term Goals - 11/30/16 1131      PT LONG TERM GOAL #1   Title  Pain 0/10 to allow pt to be up walking/standing for 2 hours without pain to be able to complete her work activity in comfort.    Time 4   Period Weeks   Status Partially Met     PT LONG TERM GOAL #2   Title Pt wound to be 100% granulated for reduced risk of infection    Time 4   Period Weeks   Status On-going     PT LONG TERM GOAL #3   Title Wound to have decreased in size to no greater than 1x.5x .5  for pt to feel comfortable with self treatment.    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 PVD (peripheral vascular disease) ([place]) 07/05/2016  . Venous stasis ulcer ([place])  03/15/2016  . Cellulitis 12/08/2015  . (HFpEF) heart failure with preserved ejection fraction ([place]) 04/16/2013  . Mitral regurgitation   . Anemia   . HTN (hypertension)   . Atrial fibrillation ([place]) 04/02/2009  . DIASTOLIC HEART FAILURE,  CHRONIC 04/02/2009    Rayetta Humphrey, PT CLT [phone] 12/19/2016, 2:03 PM  [place] [address] [place], [place], [address] Phone: [phone]   Fax:  [phone]  Name: Jody [name] MRN: [id number] Date of Birth: 06/25/1927

## 2016-12-20 ENCOUNTER — Ambulatory Visit: Payer: Medicare Other | Admitting: Internal Medicine

## 2016-12-22 ENCOUNTER — Telehealth: Payer: Self-pay | Admitting: Family

## 2016-12-22 MED ORDER — AMOXICILLIN-POT CLAVULANATE 875-125 MG PO TABS: 1.0000 | ORAL_TABLET | Freq: Two times a day (BID) | ORAL | 0 refills | Status: DC

## 2016-12-22 NOTE — Telephone Encounter (Signed)
Pt is having diarrhea and itching and it started when she started the Bactrim.

## 2016-12-22 NOTE — Telephone Encounter (Signed)
Attempted to return call and NVM.

## 2016-12-22 NOTE — Telephone Encounter (Signed)
Augmentin Prescription sent to pharmacy

## 2016-12-23 ENCOUNTER — Ambulatory Visit (INDEPENDENT_AMBULATORY_CARE_PROVIDER_SITE_OTHER): Payer: Medicare Other

## 2016-12-23 ENCOUNTER — Encounter: Payer: Self-pay | Admitting: Family

## 2016-12-23 ENCOUNTER — Ambulatory Visit (INDEPENDENT_AMBULATORY_CARE_PROVIDER_SITE_OTHER): Payer: Medicare Other | Admitting: Family

## 2016-12-23 VITALS — BP 113/74 | HR 81 | Temp 97.1°F | Ht 62.0 in

## 2016-12-23 DIAGNOSIS — M1612 Unilateral primary osteoarthritis, left hip: Secondary | ICD-10-CM

## 2016-12-23 DIAGNOSIS — M25552 Pain in left hip: Secondary | ICD-10-CM

## 2016-12-23 MED ORDER — DOXYCYCLINE HYCLATE 100 MG PO TABS: 100.0000 mg | ORAL_TABLET | Freq: Two times a day (BID) | ORAL | 0 refills | Status: DC

## 2016-12-23 MED ORDER — AMOXICILLIN-POT CLAVULANATE 875-125 MG PO TABS: 1.0000 | ORAL_TABLET | Freq: Two times a day (BID) | ORAL | 0 refills | Status: DC

## 2016-12-23 NOTE — Patient Instructions (Signed)
Hip Pain The hip is the joint between the upper legs and the lower pelvis. The bones, cartilage, tendons, and muscles of your hip joint support your body and allow you to move around. Hip pain can range from a minor ache to severe pain in one or both of your hips. The pain may be felt on the inside of the hip joint near the groin, or the outside near the buttocks and upper thigh. You may also have swelling or stiffness. Follow these instructions at home: Managing pain, stiffness, and swelling   If directed, apply ice to the injured area.  Put ice in a plastic bag.  Place a towel between your skin and the bag.  Leave the ice on for 20 minutes, 2-3 times a day  Sleep with a pillow between your legs on your most comfortable side.  Avoid any activities that cause pain. General instructions   Take over-the-counter and prescription medicines only as told by your health care provider.  Do any exercises as told by your health care provider.  Record the following:  How often you have hip pain.  The location of your pain.  What the pain feels like.  What makes the pain worse.  Keep all follow-up visits as told by your health care provider. This is important. Contact a health care provider if:  You cannot put weight on your leg.  Your pain or swelling continues or gets worse after one week.  It gets harder to walk.  You have a fever. Get help right away if:  You fall.  You have a sudden increase in pain and swelling in your hip.  Your hip is red or swollen or very tender to touch. Summary  Hip pain can range from a minor ache to severe pain in one or both of your hips.  The pain may be felt on the inside of the hip joint near the groin, or the outside near the buttocks and upper thigh.  Avoid any activities that cause pain.  Record how often you have hip pain, the location of the pain, what makes it worse and what it feels like. This information is not intended to  replace advice given to you by your health care provider. Make sure you discuss any questions you have with your health care provider. Document Released: 09/08/2009 Document Revised: 02/22/2016 Document Reviewed: 02/22/2016 Elsevier Interactive Patient Education  2017 Elsevier Inc.  

## 2016-12-23 NOTE — Progress Notes (Signed)
   Subjective:    Patient ID: Jody [name], female    DOB: September 20, 1927, 83 y.o.   MRN: [id number]  Hip Pain   The incident occurred more than 1 week ago. There was no injury mechanism. The pain is present in the left hip. The quality of the pain is described as aching. The pain is at a severity of 10/10. The pain is moderate. She reports no foreign bodies present. Exacerbated by: laying or sitting. She has tried NSAIDs and rest (ultram) for the symptoms. The treatment provided mild relief.      Review of Systems  All other systems reviewed and are negative.      Objective:   Physical Exam  Constitutional: She is oriented to person, place, and time. She appears well-developed and well-nourished. No distress.  HENT:  Head: Normocephalic.  Eyes: Pupils are equal, round, and reactive to light.  Neck: Normal range of motion. Neck supple. No thyromegaly present.  Cardiovascular: Normal rate, regular rhythm, normal heart sounds and intact distal pulses.   No murmur heard. Pulmonary/Chest: Effort normal and breath sounds normal. No respiratory distress. She has no wheezes.  Abdominal: Soft. Bowel sounds are normal. She exhibits no distension. There is no tenderness.  Musculoskeletal: She exhibits tenderness. She exhibits no edema.  Left hip pain with rotation and flexion and extension   Neurological: She is alert and oriented to person, place, and time.  Skin: Skin is warm and dry.  Psychiatric: She has a normal mood and affect. Her behavior is normal. Judgment and thought content normal.  Vitals reviewed.   X-ray- Severe left hip arthritis with bone to bone Preliminary reading by Evelina Dun, FNP [facility]   BP 113/74   Pulse 81   Temp (!) 97.1 F (36.2 C) (Oral)   Ht 5\' 2"  (1.575 m)      Assessment & Plan:  1. Pain in left hip - DG HIP UNILAT W OR W/O PELVIS 2-3 VIEWS LEFT; Future - Ambulatory referral to Orthopedic Surgery  2. Primary osteoarthritis of left hip - Ambulatory  referral to Orthopedic Surgery   Rest Ice  Tylenol  Would have sent in naprosyn, but pt on Eliquis and with age Pt to call ortho RTO prn   Evelina Dun, FNP

## 2016-12-28 ENCOUNTER — Encounter (HOSPITAL_COMMUNITY): Payer: Self-pay

## 2016-12-28 ENCOUNTER — Telehealth: Payer: Self-pay | Admitting: *Deleted

## 2016-12-28 ENCOUNTER — Ambulatory Visit (HOSPITAL_COMMUNITY): Payer: Medicare Other

## 2016-12-28 DIAGNOSIS — M79661 Pain in right lower leg: Secondary | ICD-10-CM | POA: Diagnosis not present

## 2016-12-28 DIAGNOSIS — I83013 Varicose veins of right lower extremity with ulcer of ankle: Secondary | ICD-10-CM

## 2016-12-28 DIAGNOSIS — R262 Difficulty in walking, not elsewhere classified: Secondary | ICD-10-CM

## 2016-12-28 DIAGNOSIS — L97311 Non-pressure chronic ulcer of right ankle limited to breakdown of skin: Secondary | ICD-10-CM

## 2016-12-28 DIAGNOSIS — L97319 Non-pressure chronic ulcer of right ankle with unspecified severity: Secondary | ICD-10-CM

## 2016-12-28 DIAGNOSIS — L97301 Non-pressure chronic ulcer of unspecified ankle limited to breakdown of skin: Principal | ICD-10-CM

## 2016-12-28 DIAGNOSIS — I739 Peripheral vascular disease, unspecified: Secondary | ICD-10-CM

## 2016-12-28 DIAGNOSIS — I83003 Varicose veins of unspecified lower extremity with ulcer of ankle: Secondary | ICD-10-CM

## 2016-12-28 DIAGNOSIS — M25571 Pain in right ankle and joints of right foot: Secondary | ICD-10-CM | POA: Diagnosis not present

## 2016-12-28 NOTE — Telephone Encounter (Signed)
Referral to wound care in [place], [place] and [place]

## 2016-12-28 NOTE — Therapy (Signed)
[facility] [place], [place], [address] Phone: [phone]   Fax:  [phone]  Wound Care Therapy  Patient Details  Name: Jody [name] MRN: [id number] Date of Birth: 07-May-1927 Referring Provider: Evelina Dun  Encounter Date: 12/28/2016      PT End of Session - 12/28/16 1250    Visit Number 22   Number of Visits 24   Date for PT Re-Evaluation 01/16/17   Authorization Type [employer] medicare cert8/28 thru 35/32, g code at visit 18   Authorization - Visit Number 22   Authorization - Number of Visits 28   PT Start Time 1027   PT Stop Time 1118   PT Time Calculation (min) 51 min   Activity Tolerance Patient tolerated treatment well;Patient limited by pain   Behavior During Therapy [facility] for tasks assessed/performed      Past Medical History:  Diagnosis Date  . Afib ([place])   . Anemia   . CAD (coronary artery disease)   . Chronic diastolic heart failure ([place])   . HTN (hypertension)   . Mitral regurgitation    Moderate, echo, 07/2012  . Pulmonary hypertension ([place])     Past Surgical History:  Procedure Laterality Date  . JOINT REPLACEMENT Right Feb 2015   Dr. Case- [place]  . TOTAL KNEE ARTHROPLASTY Left     There were no vitals filed for this visit.       Subjective Assessment - 12/28/16 1241    Subjective Pt stated she has hip and back pain today along with wound pain.  Reports currently on antibiotics but is changing due to reactions.   Currently in Pain? Yes   Pain Score 3    Pain Location Ankle   Pain Orientation Right            Wound Therapy - 12/28/16 1242    Subjective Pt stated she has hip and back pain today along with wound pain.  Reports currently on antibiotics but is changing due to reactions.   Patient and Family Stated Goals For the wound to heal    Date of Onset 05/06/16   Prior Treatments antibiotics, self-dressing with xeroform/gauze    Pain Assessment 0-10   Pain Score  3    Pain Type Chronic pain   Pain Location Ankle   Pain Orientation Right   Pain Descriptors / Indicators Constant   Pain Onset On-going   Patients Stated Pain Goal 0   Pain Intervention(s) Medication (See eMAR);Repositioned;Emotional support   Multiple Pain Sites No   Evaluation and Treatment Procedures Explained to Patient/Family Yes   Evaluation and Treatment Procedures agreed to   Wound Properties Date First Assessed: 08/02/16 Time First Assessed: 1125 Wound Type: Venous stasis ulcer Location: Leg Location Orientation: Right;Lower Wound Description (Comments): R lateral lower LE  Present on Admission: Yes   Dressing Type Alginate;Compression wrap  honey alginate, 4x4, silverhydrofiber, ABD, profore lite   Dressing Changed Changed   Dressing Status Clean;Dry;Intact   Dressing Change Frequency PRN   Site / Wound Assessment Clean;Dry   % Wound base Red or Granulating 15%   % Wound base Yellow/Fibrinous Exudate 85%   Peri-wound Assessment Edema;Hemosiderin   Wound Length (cm) 10 cm  was 7   Wound Width (cm) 4.7 cm  was 2cm   Wound Depth (cm) 0.2 cm   Wound Volume (cm^3) 9.4 cm^3   Wound Surface Area (cm^2) 47 cm^2   Margins Attached edges (approximated)   Closure  None   Drainage Amount Copious   Drainage Description Purulent   Treatment Cleansed;Debridement (Selective)   Selective Debridement - Location wound bed    Selective Debridement - Tools Used Forceps;Scalpel   Selective Debridement - Tissue Removed biofilm and slough    Wound Therapy - Clinical Statement Received signed order for compression hose, gave to daughter and educated purchase and appropriate time to purchase.  Pt continues to be limited by pain limiting ability to debride wound.  Noted copious purulent drainage wiht odor.  Wound increase in size.  Continued with honey alginate wiht additional of silverhydrofiber and ABD pad to address drainage.  Plan to call and discuss with nurse concerning wound size later  today.     Wound Therapy - Functional Problem List difficulty walking and standing due to pain    Factors Delaying/Impairing Wound Healing Infection - systemic/local;Vascular compromise   Hydrotherapy Plan Debridement;Dressing change;Patient/family education   Wound Therapy - Frequency --  2x/week   Wound Therapy - Current Recommendations PT   Wound Plan debridement, manual and dressing change using appropriate bandaging conducive to wound environment.  Measure weekly.   Dressing  medihoney alginate, 4x4, silverhydrofiber, ABD pad and profore lite                   PT Short Term Goals - 11/30/16 1131      PT SHORT TERM GOAL #1   Title Patient to experience pain no more than 5/10 to allow pt to be standing/walking  for 30 mintues in comfort to tolerate job activities.    Time 2   Period Weeks   Status Achieved     PT SHORT TERM GOAL #2   Title Wound granulationto have increased by at least 50% to decrease risk of infection.   Time 2   Period Weeks   Status Achieved     PT SHORT TERM GOAL #3   Title Pt drainage to be minimal to decrease risk of infection    Time 2   Period Weeks   Status Achieved           PT Long Term Goals - 11/30/16 1131      PT LONG TERM GOAL #1   Title  Pain 0/10 to allow pt to be up walking/standing for 2 hours without pain to be able to complete her work activity in comfort.    Time 4   Period Weeks   Status Partially Met     PT LONG TERM GOAL #2   Title Pt wound to be 100% granulated for reduced risk of infection    Time 4   Period Weeks   Status On-going     PT LONG TERM GOAL #3   Title Wound to have decreased in size to no greater than 1x.5x .5  for pt to feel comfortable with self treatment.    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 PVD (peripheral vascular disease) ([place]) 07/05/2016  . Venous stasis ulcer ([place]) 03/15/2016  . Cellulitis 12/08/2015  . (HFpEF) heart failure with preserved ejection fraction ([place]) 04/16/2013  . Mitral regurgitation   . Anemia   . HTN (hypertension)   . Atrial fibrillation ([place]) 04/02/2009  . DIASTOLIC HEART FAILURE, CHRONIC 04/02/2009   Ihor Austin, [place]; [place]  Aldona Lento 12/28/2016, 12:51 PM  [facility] [address] [place], [place], [address] Phone: [phone]   Fax:  [phone]  Name: Jody [name] MRN: [id number] Date of Birth: 04/21/1927

## 2016-12-28 NOTE — Telephone Encounter (Signed)
Called and spoke to triage RN at Dr Lenna Gilford office concerning increase in wound size.  Following discussion sending referral for W[facility] Care [facility].  Evaluation PT aware of referral.    Ihor Austin, [place]; [facility] [phone]

## 2016-12-28 NOTE — Telephone Encounter (Signed)
Incoming call from wound center Wound is not healing Recommend pt go to wound care in [place] for care Please advise

## 2017-01-02 ENCOUNTER — Encounter (HOSPITAL_COMMUNITY): Payer: Self-pay | Admitting: Emergency Medicine

## 2017-01-02 ENCOUNTER — Emergency Department (HOSPITAL_COMMUNITY)
Admission: EM | Admit: 2017-01-02 | Discharge: 2017-01-02 | Disposition: A | Discharge status: Home / Self Care | Payer: Medicare Other | Attending: Emergency Medicine | Admitting: Emergency Medicine

## 2017-01-02 ENCOUNTER — Emergency Department (HOSPITAL_COMMUNITY): Payer: Medicare Other

## 2017-01-02 ENCOUNTER — Ambulatory Visit (INDEPENDENT_AMBULATORY_CARE_PROVIDER_SITE_OTHER): Payer: Medicare Other | Admitting: Family

## 2017-01-02 ENCOUNTER — Encounter: Payer: Self-pay | Admitting: Family

## 2017-01-02 VITALS — BP 124/78 | HR 83 | Temp 98.5°F

## 2017-01-02 DIAGNOSIS — S8991XA Unspecified injury of right lower leg, initial encounter: Secondary | ICD-10-CM | POA: Diagnosis not present

## 2017-01-02 DIAGNOSIS — Z96652 Presence of left artificial knee joint: Secondary | ICD-10-CM | POA: Insufficient documentation

## 2017-01-02 DIAGNOSIS — Z4801 Encounter for change or removal of surgical wound dressing: Secondary | ICD-10-CM | POA: Insufficient documentation

## 2017-01-02 DIAGNOSIS — Z7901 Long term (current) use of anticoagulants: Secondary | ICD-10-CM | POA: Insufficient documentation

## 2017-01-02 DIAGNOSIS — I4891 Unspecified atrial fibrillation: Secondary | ICD-10-CM | POA: Insufficient documentation

## 2017-01-02 DIAGNOSIS — I739 Peripheral vascular disease, unspecified: Secondary | ICD-10-CM | POA: Diagnosis not present

## 2017-01-02 DIAGNOSIS — I872 Venous insufficiency (chronic) (peripheral): Secondary | ICD-10-CM | POA: Diagnosis not present

## 2017-01-02 DIAGNOSIS — I110 Hypertensive heart disease with heart failure: Secondary | ICD-10-CM | POA: Insufficient documentation

## 2017-01-02 DIAGNOSIS — X58XXXS Exposure to other specified factors, sequela: Secondary | ICD-10-CM | POA: Diagnosis not present

## 2017-01-02 DIAGNOSIS — I5032 Chronic diastolic (congestive) heart failure: Secondary | ICD-10-CM | POA: Insufficient documentation

## 2017-01-02 DIAGNOSIS — S81802S Unspecified open wound, left lower leg, sequela: Secondary | ICD-10-CM | POA: Diagnosis not present

## 2017-01-02 DIAGNOSIS — I2510 Atherosclerotic heart disease of native coronary artery without angina pectoris: Secondary | ICD-10-CM | POA: Diagnosis not present

## 2017-01-02 DIAGNOSIS — L97301 Non-pressure chronic ulcer of unspecified ankle limited to breakdown of skin: Secondary | ICD-10-CM

## 2017-01-02 DIAGNOSIS — I83003 Varicose veins of unspecified lower extremity with ulcer of ankle: Secondary | ICD-10-CM

## 2017-01-02 DIAGNOSIS — L03115 Cellulitis of right lower limb: Secondary | ICD-10-CM

## 2017-01-02 DIAGNOSIS — S81802D Unspecified open wound, left lower leg, subsequent encounter: Secondary | ICD-10-CM | POA: Insufficient documentation

## 2017-01-02 DIAGNOSIS — I878 Other specified disorders of veins: Secondary | ICD-10-CM | POA: Diagnosis not present

## 2017-01-02 DIAGNOSIS — Z79899 Other long term (current) drug therapy: Secondary | ICD-10-CM | POA: Insufficient documentation

## 2017-01-02 LAB — CBC WITH DIFFERENTIAL/PLATELET
BASOS ABS: 0 10*3/uL (ref 0.0–0.1)
Basophils Relative: 0 %
Eosinophils Absolute: 0.2 10*3/uL (ref 0.0–0.7)
Eosinophils Relative: 2 %
HEMATOCRIT: 40.2 % (ref 36.0–46.0)
HEMOGLOBIN: 12.8 g/dL (ref 12.0–15.0)
LYMPHS ABS: 1.2 10*3/uL (ref 0.7–4.0)
LYMPHS PCT: 18 %
MCH: 31 pg (ref 26.0–34.0)
MCHC: 31.8 g/dL (ref 30.0–36.0)
MCV: 97.3 fL (ref 78.0–100.0)
Monocytes Absolute: 0.5 10*3/uL (ref 0.1–1.0)
Monocytes Relative: 7 %
NEUTROS ABS: 5 10*3/uL (ref 1.7–7.7)
NEUTROS PCT: 73 %
PLATELETS: 286 10*3/uL (ref 150–400)
RBC: 4.13 MIL/uL (ref 3.87–5.11)
RDW: 14.3 % (ref 11.5–15.5)
WBC: 6.9 10*3/uL (ref 4.0–10.5)

## 2017-01-02 LAB — BASIC METABOLIC PANEL
ANION GAP: 11 (ref 5–15)
BUN: 22 mg/dL — AB (ref 6–20)
CHLORIDE: 100 mmol/L — AB (ref 101–111)
CO2: 27 mmol/L (ref 22–32)
Calcium: 9.3 mg/dL (ref 8.9–10.3)
Creatinine, Ser: 1.08 mg/dL — ABNORMAL HIGH (ref 0.44–1.00)
GFR calc Af Amer: 52 mL/min — ABNORMAL LOW (ref >60)
GFR, EST NON AFRICAN AMERICAN: 45 mL/min — AB (ref >60)
GLUCOSE: 91 mg/dL (ref 65–99)
POTASSIUM: 4.3 mmol/L (ref 3.5–5.1)
Sodium: 138 mmol/L (ref 135–145)

## 2017-01-02 MED ORDER — SILVER SULFADIAZINE 1 % EX CREA
TOPICAL_CREAM | Freq: Every day | CUTANEOUS | Status: DC
  Administered 2017-01-02: 1 via TOPICAL
  Filled 2017-01-02: qty 50

## 2017-01-02 MED ORDER — HYDROCODONE-ACETAMINOPHEN 5-325 MG PO TABS
1.0000 | ORAL_TABLET | Freq: Once | ORAL | Status: AC
  Administered 2017-01-02: 1 via ORAL
  Filled 2017-01-02: qty 1

## 2017-01-02 NOTE — ED Notes (Signed)
silvadene cream applied and wrapped with nonocclusive dressing.

## 2017-01-02 NOTE — Discharge Instructions (Signed)
Call wound care center for follow-up appointment as soon as possible.

## 2017-01-02 NOTE — Progress Notes (Signed)
   Subjective:    Patient ID: Jody [name], female    DOB: [birth year], 83 y.o.   MRN: [id number]  HPI PT presents to the office today with worsening leg wound. PT has had a venous ulcer on her right lower leg and was going to a wound care in [place]. PT was told they have done all they can do and asked [place] to do a referral to [facility]. I placed the order and called the daughter Friday evening. Daughter states this appointment was "already set up and I will call wound care today".   Pt has been on doxycycline with worsening wound. She reports constant pain of 10 out 10 with yellow discharge and strong odor.   PT states since last week the wound is the worse it has been over the last year.   Review of Systems  Skin: Positive for wound.  All other systems reviewed and are negative.      Objective:   Physical Exam  Constitutional: She is oriented to person, place, and time. She appears well-developed and well-nourished. No distress.  HENT:  Head: Normocephalic.  Cardiovascular: Normal rate, regular rhythm and intact distal pulses.   Murmur heard. Pulmonary/Chest: Effort normal and breath sounds normal. No respiratory distress. She has no wheezes.  Abdominal: Soft. Bowel sounds are normal. She exhibits no distension. There is no tenderness.  Musculoskeletal: Normal range of motion. She exhibits no edema or tenderness.  Neurological: She is alert and oriented to person, place, and time.  Skin: Skin is warm and dry.  Right ankle wound apporx 9.2X 12 cm. That extends along posterior ankle with sloughing and foul odor.   Psychiatric: She has a normal mood and affect. Her behavior is normal. Judgment and thought content normal.  Vitals reviewed.       Area cleaned and gauze dressing applied.   BP 124/78   Pulse 83   Temp 98.5 F (36.9 C) (Oral)      Assessment & Plan:  1. Venous stasis ulcer of ankle limited to breakdown of skin with varicose veins, unspecified laterality  ([place])  2. Cellulitis of right lower extremity  3. PVD (peripheral vascular disease) ([place])  Pt has already needed IV antibiotics several months ago. I have sent pt to the ED. Her wound is significantly worse from last time and believe pt will need IV antibiotics.  With patient's age, PVD, and worsening wound makes this more complex I will hold off on wound culture and x-ray today since sending her to ED  Evelina Dun, FNP

## 2017-01-02 NOTE — Patient Instructions (Signed)

## 2017-01-02 NOTE — ED Triage Notes (Signed)
Pt has wound on right ankle/lower leg x 2 hrs. Wound center told then they could do nothing else for her. rle swelling noted. Nad. A/o. Denies fevers.

## 2017-01-03 ENCOUNTER — Telehealth: Payer: Self-pay | Admitting: Family

## 2017-01-03 ENCOUNTER — Ambulatory Visit (HOSPITAL_COMMUNITY): Payer: Medicare Other | Attending: Family | Admitting: Physical Therapy

## 2017-01-03 DIAGNOSIS — L97311 Non-pressure chronic ulcer of right ankle limited to breakdown of skin: Secondary | ICD-10-CM | POA: Insufficient documentation

## 2017-01-03 DIAGNOSIS — M25571 Pain in right ankle and joints of right foot: Secondary | ICD-10-CM | POA: Insufficient documentation

## 2017-01-03 DIAGNOSIS — M79661 Pain in right lower leg: Secondary | ICD-10-CM

## 2017-01-03 DIAGNOSIS — R262 Difficulty in walking, not elsewhere classified: Secondary | ICD-10-CM | POA: Diagnosis not present

## 2017-01-03 DIAGNOSIS — I83013 Varicose veins of right lower extremity with ulcer of ankle: Secondary | ICD-10-CM | POA: Diagnosis not present

## 2017-01-03 DIAGNOSIS — L97313 Non-pressure chronic ulcer of right ankle with necrosis of muscle: Secondary | ICD-10-CM | POA: Diagnosis not present

## 2017-01-03 MED ORDER — HYDROCODONE-ACETAMINOPHEN 5-325 MG PO TABS: 1.0000 | ORAL_TABLET | Freq: Two times a day (BID) | ORAL | 0 refills | Status: DC | PRN

## 2017-01-03 NOTE — Therapy (Signed)
[facility] [address] [place], [place], [address] Phone: [phone]   Fax:  [phone]  Wound Care Therapy  Patient Details  Name: Jody [name] MRN: [id number] Date of Birth: 01/05/1928 Referring Provider: Jannifer Rodney  Encounter Date: 01/03/2017      PT End of Session - 01/03/17 1638    Visit Number 23   Number of Visits 24   Date for PT Re-Evaluation 01/16/17   Authorization Type [employer] healthcare medicare cert8/28 thru 10/15, g code at visit 18   Authorization - Visit Number 23   Authorization - Number of Visits 28   PT Start Time 1040   PT Stop Time 1115   PT Time Calculation (min) 35 min   Activity Tolerance Patient tolerated treatment well;Patient limited by pain   Behavior During Therapy [facility] for tasks assessed/performed      Past Medical History:  Diagnosis Date  . Afib (HCC)   . Anemia   . CAD (coronary artery disease)   . Chronic diastolic heart failure (HCC)   . HTN (hypertension)   . Mitral regurgitation    Moderate, echo, 07/2012  . Pulmonary hypertension (HCC)     Past Surgical History:  Procedure Laterality Date  . JOINT REPLACEMENT Right Feb 2015   Dr. Case- Jonita Albee  . TOTAL KNEE ARTHROPLASTY Left     There were no vitals filed for this visit.                  Wound Therapy - 01/03/17 1630    Subjective Pt states that she has been referred for the hyperbaric chamber for her wound but she can not get in untiil Nov 10th.  MD stated to continue to come here until she is able to come to therapy.  They are also going to put a pic line in for antibiotics.    Patient and Family Stated Goals For the wound to heal    Date of Onset 05/06/16   Prior Treatments antibiotics, self-dressing with xeroform/gauze    Pain Assessment 0-10   Pain Score 8   with debridement only.   Pain Type Chronic pain;Neuropathic pain   Pain Location Ankle   Pain Orientation Right   Pain Descriptors / Indicators  Stabbing   Patients Stated Pain Goal 2   Evaluation and Treatment Procedures Explained to Patient/Family Yes   Evaluation and Treatment Procedures agreed to   Wound Properties Date First Assessed: 08/02/16 Time First Assessed: 1125 Wound Type: Venous stasis ulcer Location: Leg Location Orientation: Right;Lower Wound Description (Comments): R lateral lower LE  Present on Admission: Yes   Dressing Type Impregnated gauze (bismuth);Gauze (Comment)  honey alginate, 4x4, silverhydrofiber, ABD, profore lite   Dressing Changed Changed   Dressing Status Clean;Dry;Intact   Dressing Change Frequency PRN   Site / Wound Assessment Clean;Dry   % Wound base Red or Granulating 20%   % Wound base Yellow/Fibrinous Exudate 80%   Peri-wound Assessment Edema;Hemosiderin   Wound Length (cm) 8 cm   Wound Width (cm) 10 cm  wound now wraps around the back of pt ankle.    Wound Surface Area (cm^2) 80 cm^2   Margins Attached edges (approximated)   Closure None   Drainage Amount Copious   Drainage Description Purulent   Treatment Cleansed;Debridement (Selective)   Selective Debridement - Location wound bed    Selective Debridement - Tools Used Forceps;Scalpel   Selective Debridement - Tissue Removed slough    Wound Therapy -  Clinical Statement Pt is to continue to come to therapy here until she is able to get into the wound center for hyperbaric chamber.  They will be placing a pic line in as well for IV antibiotics.  PT wound continues to increase in size and pt is intolerant to debridement.  Minimal debridement done today due to high pain levels.  Medihoney followed by alginate and ab pad followed by profore multilayer compression bandage system    Wound Therapy - Functional Problem List difficulty walking and standing due to pain    Factors Delaying/Impairing Wound Healing Infection - systemic/local;Vascular compromise   Hydrotherapy Plan Debridement;Dressing change;Patient/family education   Wound Therapy -  Frequency --  2x/week   Wound Therapy - Current Recommendations PT   Wound Plan debridement, manual and dressing change using appropriate bandaging conducive to wound environment.  Measure weekly.   Dressing  medihoney alginate, abpad                   PT Short Term Goals - 11/30/16 1131      PT SHORT TERM GOAL #1   Title Patient to experience pain no more than 5/10 to allow pt to be standing/walking  for 30 mintues in comfort to tolerate job activities.    Time 2   Period Weeks   Status Achieved     PT SHORT TERM GOAL #2   Title Wound granulationto have increased by at least 50% to decrease risk of infection.   Time 2   Period Weeks   Status Achieved     PT SHORT TERM GOAL #3   Title Pt drainage to be minimal to decrease risk of infection    Time 2   Period Weeks   Status Achieved           PT Long Term Goals - 11/30/16 1131      PT LONG TERM GOAL #1   Title  Pain 0/10 to allow pt to be up walking/standing for 2 hours without pain to be able to complete her work activity in comfort.    Time 4   Period Weeks   Status Partially Met     PT LONG TERM GOAL #2   Title Pt wound to be 100% granulated for reduced risk of infection    Time 4   Period Weeks   Status On-going     PT LONG TERM GOAL #3   Title Wound to have decreased in size to no greater than 1x.5x .5  for pt to feel comfortable with self treatment.    Time 4   Period Weeks   Status On-going               Plan - 01/03/17 1638    Rehab Potential Fair   PT Frequency 2x / week   PT Duration 4 weeks   PT Treatment/Interventions ADLs/Self Care Home Management;Other (comment)  debridement as tolerated    PT Next Visit Plan Continue to measure wound each time pt comes in.        Patient will benefit from skilled therapeutic intervention in order to improve the following deficits and impairments:  Pain  Visit Diagnosis: Pain in right lower leg  Difficulty in walking, not elsewhere  classified  Non-pressure chronic ulcer of right ankle limited to breakdown of skin [facility])     Problem List Patient Active Problem List   Diagnosis Date Noted  . PVD (peripheral vascular disease) ([place]) 07/05/2016  . Venous stasis  ulcer ([place]) 03/15/2016  . Cellulitis 12/08/2015  . (HFpEF) heart failure with preserved ejection fraction ([place]) 04/16/2013  . Mitral regurgitation   . Anemia   . HTN (hypertension)   . Atrial fibrillation ([place]) 04/02/2009  . DIASTOLIC HEART FAILURE, CHRONIC 04/02/2009    Rayetta Humphrey, PT CLT [phone] 01/03/2017, 4:39 PM  [facility] [address] [place], [place], [address] Phone: [phone]   Fax:  [phone]  Name: SPR[place] SAN MRN: [id number] Date of Birth: 1927/08/11

## 2017-01-03 NOTE — Telephone Encounter (Signed)
Pt needs to call her current wound clinic and continue to see them until her appt on 02/06/17. Pain medication rx ready for pick up

## 2017-01-03 NOTE — Telephone Encounter (Signed)
Aware via voicemail that prescription is up front for pick up and she is to follow up with wound clinic

## 2017-01-03 NOTE — Telephone Encounter (Signed)
The ER Wrapped it up and that she needed to go to the [facility] wound center. The earliest they would see her is 02/06/17. They also would not write her any pain meds. She said Alyse Low told her she would write it for her. Please advise.

## 2017-01-04 DIAGNOSIS — M1612 Unilateral primary osteoarthritis, left hip: Secondary | ICD-10-CM | POA: Diagnosis not present

## 2017-01-06 ENCOUNTER — Telehealth: Payer: Self-pay | Admitting: Family

## 2017-01-06 MED ORDER — HYDROCODONE-ACETAMINOPHEN 5-325 MG PO TABS: 1.0000 | ORAL_TABLET | Freq: Two times a day (BID) | ORAL | 0 refills | Status: DC | PRN

## 2017-01-06 NOTE — Telephone Encounter (Signed)
Advise if another script will be done.

## 2017-01-06 NOTE — Telephone Encounter (Signed)
RX ready for pick up 

## 2017-01-06 NOTE — Telephone Encounter (Signed)
LMOM for daughter, that RX is ready

## 2017-01-10 NOTE — ED Provider Notes (Signed)
[facility] DEPT Provider Note   CSN: [id number] Arrival date & time: 01/02/17  1214     History   Chief Complaint Chief Complaint  Patient presents with  . Wound Check    HPI Jody [name] is a 83 y.o. female. Chief complaint is wound check  HPI:  83 year old female. History of venous stasis disease and a chronic right lower leg wound. His been following at the [facility]. in [place]. Has had vascular studies of her leg and has had wound care evaluation ongoing over several months. Was told today that she may need hyperbaric treatment of her leg. Daughter states that she thought that she would have to come through the emergency room to have that done.  Past Medical History:  Diagnosis Date  . Afib ([place])   . Anemia   . CAD (coronary artery disease)   . Chronic diastolic heart failure ([place])   . HTN (hypertension)   . Mitral regurgitation    Moderate, echo, 07/2012  . Pulmonary hypertension [facility])     Patient Active Problem List   Diagnosis Date Noted  . PVD (peripheral vascular disease) ([place]) 07/05/2016  . Venous stasis ulcer ([place]) 03/15/2016  . Cellulitis 12/08/2015  . (HFpEF) heart failure with preserved ejection fraction ([place]) 04/16/2013  . Mitral regurgitation   . Anemia   . HTN (hypertension)   . Atrial fibrillation ([place]) 04/02/2009  . DIASTOLIC HEART FAILURE, CHRONIC 04/02/2009    Past Surgical History:  Procedure Laterality Date  . JOINT REPLACEMENT Right Feb 2015   Dr. Case- Ledell Noss  . TOTAL KNEE ARTHROPLASTY Left     OB History    No data available       Home Medications    Prior to Admission medications   Medication Sig Start Date End Date Taking? Authorizing Provider  apixaban (ELIQUIS) 2.5 MG TABS tablet Take 1 tablet (2.5 mg total) by mouth 2 (two) times daily. 06/10/16   Deboraha Sprang, MD  diltiazem (CARTIA XT) 240 MG 24 hr capsule Take 240 mg by mouth daily.    [provider]  doxycycline (VIBRA-TABS) 100 MG tablet Take 1  tablet (100 mg total) by mouth 2 (two) times daily. 12/23/16   Evelina Dun A, FNP  ferrous sulfate 325 (65 FE) MG tablet Take 325 mg by mouth.    [provider]  furosemide (LASIX) 20 MG tablet Take 1 tablet (20 mg total) by mouth daily as needed for edema. 06/10/16   Deboraha Sprang, MD  HYDROcodone-acetaminophen (NORCO/VICODIN) 5-325 MG tablet Take 1 tablet by mouth every 12 (twelve) hours as needed for moderate pain. 01/06/17   Hawks, Christy A, FNP  IRON, FERROUS GLUCONATE, PO Take 27 mg by mouth.    [provider]  vitamin B-12 (CYANOCOBALAMIN) 1000 MCG tablet Take 1,000 mcg by mouth daily.    [provider]    Family History Family History  Problem Relation Age of Onset  . Diabetes Mother     Social History Social History  Substance Use Topics  . Smoking status: Never Smoker  . Smokeless tobacco: Never Used  . Alcohol use No     Allergies   Levaquin [levofloxacin]   Review of Systems Review of Systems  Constitutional: Negative for appetite change, chills, diaphoresis, fatigue and fever.  HENT: Negative for mouth sores, sore throat and trouble swallowing.   Eyes: Negative for visual disturbance.  Respiratory: Negative for cough, chest tightness, shortness of breath and wheezing.   Cardiovascular:  Negative for chest pain.  Gastrointestinal: Negative for abdominal distention, abdominal pain, diarrhea, nausea and vomiting.  Endocrine: Negative for polydipsia, polyphagia and polyuria.  Genitourinary: Negative for dysuria, frequency and hematuria.  Musculoskeletal: Negative for gait problem.  Skin: Positive for wound. Negative for color change, pallor and rash.  Neurological: Negative for dizziness, syncope, light-headedness and headaches.  Hematological: Does not bruise/bleed easily.  Psychiatric/Behavioral: Negative for behavioral problems and confusion.     Physical Exam Updated Vital Signs BP (!) 155/105   Pulse 71   Temp 97.6 F (36.4  C) (Oral)   Resp 18   SpO2 98%   Physical Exam  Constitutional: She is oriented to person, place, and time. She appears well-developed and well-nourished. No distress.  HENT:  Head: Normocephalic.  Eyes: Pupils are equal, round, and reactive to light. Conjunctivae are normal. No scleral icterus.  Neck: Normal range of motion. Neck supple. No thyromegaly present.  Cardiovascular: Normal rate and regular rhythm.  Exam reveals no gallop and no friction rub.   No murmur heard. Pulmonary/Chest: Effort normal and breath sounds normal. No respiratory distress. She has no wheezes. She has no rales.  Abdominal: Soft. Bowel sounds are normal. She exhibits no distension. There is no tenderness. There is no rebound.  Musculoskeletal: Normal range of motion.  Neurological: She is alert and oriented to person, place, and time.  Skin: Skin is warm and dry. No rash noted.  Venous stasis wound lower extremity. No crepitus. Granulating well. Some exudate. No cellulitic change. Good perfusion to the foot distally.  Psychiatric: She has a normal mood and affect. Her behavior is normal.     ED Treatments / Results  Labs (all labs ordered are listed, but only abnormal results are displayed) Labs Reviewed  BASIC METABOLIC PANEL - Abnormal; Notable for the following:       Result Value   Chloride 100 (*)    BUN 22 (*)    Creatinine, Ser 1.08 (*)    GFR calc non Af Amer 45 (*)    GFR calc Af Amer 52 (*)    All other components within normal limits  CBC WITH DIFFERENTIAL/PLATELET    EKG  EKG Interpretation None       Radiology No results found.  Procedures Procedures (including critical care time)  Medications Ordered in ED Medications  HYDROcodone-acetaminophen (NORCO/VICODIN) 5-325 MG per tablet 1 tablet (1 tablet Oral Given 01/02/17 1839)     Initial Impression / Assessment and Plan / ED Course  I have reviewed the triage vital signs and the nursing notes.  Pertinent labs &  imaging results that were available during my care of the patient were reviewed by me and considered in my medical decision making (see chart for details).    Wound appears chronic. No sign of osteoarthritis on plain x-rays. No leukocytosis or fever. Think she is appropriate to continue a simple wound care. Prescribed Silvadene here. Referred to [facility] wound care center.  Final Clinical Impressions(s) / ED Diagnoses   Final diagnoses:  Wound of left lower extremity, sequela  Venous stasis dermatitis of left lower extremity    New Prescriptions Discharge Medication List as of 01/02/2017  6:37 PM       Tanna Furry, MD 01/10/17 [phone]

## 2017-01-11 ENCOUNTER — Encounter: Payer: Self-pay | Admitting: Internal Medicine

## 2017-01-11 ENCOUNTER — Ambulatory Visit (INDEPENDENT_AMBULATORY_CARE_PROVIDER_SITE_OTHER): Payer: Medicare Other | Admitting: Internal Medicine

## 2017-01-11 VITALS — BP 158/77 | HR 80 | Temp 98.3°F | Ht 60.0 in | Wt 113.0 lb

## 2017-01-11 DIAGNOSIS — L97909 Non-pressure chronic ulcer of unspecified part of unspecified lower leg with unspecified severity: Secondary | ICD-10-CM | POA: Diagnosis not present

## 2017-01-11 DIAGNOSIS — I83009 Varicose veins of unspecified lower extremity with ulcer of unspecified site: Secondary | ICD-10-CM

## 2017-01-11 NOTE — Addendum Note (Signed)
Addended by: Dolan Amen D on: 01/11/2017 05:03 PM   Modules accepted: Orders

## 2017-01-11 NOTE — Progress Notes (Signed)
Patient ID: Jody [name], female   DOB: 23-Mar-1928, 83 y.o.   MRN: [id number]          [facility] for Infectious Disease  Reason for Consult: Possible infection of chronic, right leg venous stasis ulcer Referring Provider: Evelina Dun, FNP  Assessment: The primary problem here is severe, chronic venous stasis with ulceration. She is colonized with a progressively more resistant Pseudomonas isolate. She has also been intolerant of nearly every antibiotic she has been treated with. I am still not absolutely convinced that active infection is preventing the wound from healing. Her wound care regimen was changed back to Medihoney last week and she'll will be transferring care to the wound care center here in  in the next few weeks. I suggested holding off on PICC placement and IV meropenem pending the results of those changes. She will follow-up with me on 02/08/2017.  Plan: 1. Continue observation off of antibiotics for now 2. Follow-up in 3 weeks   Patient Active Problem List   Diagnosis Date Noted  . Venous stasis ulcer ([place]) 03/15/2016    Priority: High  . PVD (peripheral vascular disease) ([place]) 07/05/2016  . Cellulitis 12/08/2015  . (HFpEF) heart failure with preserved ejection fraction ([place]) 04/16/2013  . Mitral regurgitation   . Anemia   . HTN (hypertension)   . Atrial fibrillation ([place]) 04/02/2009  . DIASTOLIC HEART FAILURE, CHRONIC 04/02/2009    Patient's Medications  New Prescriptions   No medications on file  Previous Medications   APIXABAN (ELIQUIS) 2.5 MG TABS TABLET    Take 1 tablet (2.5 mg total) by mouth 2 (two) times daily.   DILTIAZEM (CARTIA XT) 240 MG 24 HR CAPSULE    Take 240 mg by mouth daily.   FERROUS SULFATE 325 (65 FE) MG TABLET    Take 325 mg by mouth.   FUROSEMIDE (LASIX) 20 MG TABLET    Take 1 tablet (20 mg total) by mouth daily as needed for edema.   HYDROCODONE-ACETAMINOPHEN (NORCO/VICODIN) 5-325 MG TABLET    Take 1 tablet by  mouth every 12 (twelve) hours as needed for moderate pain.   VITAMIN B-12 (CYANOCOBALAMIN) 1000 MCG TABLET    Take 1,000 mcg by mouth daily.  Modified Medications   No medications on file  Discontinued Medications   DOXYCYCLINE (VIBRA-TABS) 100 MG TABLET    Take 1 tablet (100 mg total) by mouth 2 (two) times daily.   IRON, FERROUS GLUCONATE, PO    Take 27 mg by mouth.    HPI: Jody [name] is a 83 y.o. female with chronic venous insufficiency. For the past several years she has had problems with an ulcer on her right lateral ankle. She's been followed at the wound center in [facility]. The ulcer finally healed last September only 2 reappear about 6 months ago. She has been followed weekly at the wound center where her wound has been debrided and her leg wrapped. A wound culture done last September grew pseudomonas aeruginosa that was sensitive to all antibiotics tested. Measurements from the wound center show that her ulcer was getting progressively bigger and May and early June. On 09/12/2016 it measured 10 x 6 cm. A culture that was done on 09/08/2016 grew Pseudomonas and Klebsiella sensitive to ciprofloxacin and cefepime. She was started on ciprofloxacin on 09/12/2016. Her daughter tells me that she developed shortness of breath and there was concern that she was having an allergic reaction. She was changed to IV cefepime on 09/14/2016. It  was being renally dosed so she only had to have one dose daily. That was given at [facility]. She took it for 10 days. Repeat culture on 09/27/2016 grew Pseudomonas again but the isolate was now resistant to ciprofloxacin and cefepime. She was last seen at the wound center on 10/19/2016. The wound was reported to be looking better and had decreased in size to 7.2 x 3 cm.  I saw her on 10/24/2016 and did not feel that further antibiotic therapy was indicated. She states that the wound was improving as long as Medihoney was being used on the wound.  She says that after it was stopped recently the wound started to get larger and spread around the medial ankle. She was recently given a trial of doxycycline but developed shortness of breath and stopped taking it after 2 doses. She was referred to the wound center hearing [facility] for consideration of hyperbaric oxygen therapy but does not have a visit until 02/06/2017. She was sent to the [facility] emergency department on 01/02/2017 for possible IV antibiotics but was sent home to continue outpatient wound care.  Review of Systems: Review of Systems  Constitutional: Negative for chills, diaphoresis and fever.  Gastrointestinal: Negative for abdominal pain, diarrhea, nausea and vomiting.  Musculoskeletal:       She says that anything touching her ankle is very painful. She has had trouble tolerating debridements at the wound center.      Past Medical History:  Diagnosis Date  . Afib ([place])   . Anemia   . CAD (coronary artery disease)   . Chronic diastolic heart failure ([place])   . HTN (hypertension)   . Mitral regurgitation    Moderate, echo, 07/2012  . Pulmonary hypertension ([place])     Social History  Substance Use Topics  . Smoking status: Never Smoker  . Smokeless tobacco: Never Used  . Alcohol use No    Family History  Problem Relation Age of Onset  . Diabetes Mother    Allergies  Allergen Reactions  . Levaquin [Levofloxacin] Other (See Comments)    Patient stated vision became blurry and became weak.    OBJECTIVE: Vitals:   01/11/17 1448  BP: (!) 158/77  Pulse: 80  Temp: 98.3 F (36.8 C)  TempSrc: Oral  Weight: 113 lb (51.3 kg)  Height: 5' (1.524 m)   Body mass index is 22.07 kg/m.   Physical Exam  Constitutional: She is oriented to person, place, and time.  She is accompanied by her daughter.  HENT:  She is hard of hearing.  Neurological: She is alert and oriented to person, place, and time.  Skin:  She has an irregularly shaped  superficial ulcer on her right lateral ankle that is about 50% larger than when I saw her in July. The wound now also wraps around her medial ankle. The skin surrounding the ulcer is yellow and macerated. There is a large amount of serosanguineous drainage on her dressing that has been in place 1 week. There is no surrounding cellulitis or odor.  Psychiatric: Mood and affect normal.    Microbiology: No results found for this or any previous visit (from the past 240 hour(s)).  Michel Bickers, MD [facility] [phone] pager   [phone] cell 01/11/2017, 3:29 PM

## 2017-01-12 ENCOUNTER — Encounter (HOSPITAL_COMMUNITY): Payer: Self-pay

## 2017-01-12 ENCOUNTER — Ambulatory Visit (HOSPITAL_COMMUNITY): Payer: Medicare Other

## 2017-01-12 DIAGNOSIS — L97313 Non-pressure chronic ulcer of right ankle with necrosis of muscle: Secondary | ICD-10-CM

## 2017-01-12 DIAGNOSIS — M79661 Pain in right lower leg: Secondary | ICD-10-CM | POA: Diagnosis not present

## 2017-01-12 DIAGNOSIS — I83013 Varicose veins of right lower extremity with ulcer of ankle: Secondary | ICD-10-CM | POA: Diagnosis not present

## 2017-01-12 DIAGNOSIS — L97311 Non-pressure chronic ulcer of right ankle limited to breakdown of skin: Secondary | ICD-10-CM | POA: Diagnosis not present

## 2017-01-12 DIAGNOSIS — M25571 Pain in right ankle and joints of right foot: Secondary | ICD-10-CM | POA: Diagnosis not present

## 2017-01-12 DIAGNOSIS — R262 Difficulty in walking, not elsewhere classified: Secondary | ICD-10-CM

## 2017-01-12 NOTE — Therapy (Signed)
[facility] [address] [place], [place], [address] Phone: [phone]   Fax:  [phone]  Wound Care Therapy  Patient Details  Name: Jody [name] MRN: [id number] Date of Birth: 1927/10/14 Referring Provider: Evelina Dun  Encounter Date: 01/12/2017      PT End of Session - 01/12/17 1209    Visit Number 24   Number of Visits 30   Date for PT Re-Evaluation 01/16/17   Authorization Type [employer] medicare cert8/28 thru 96/78, g code at visit 18   Authorization - Visit Number 24   Authorization - Number of Visits 30   PT Start Time 1040   PT Stop Time 1120   PT Time Calculation (min) 40 min   Activity Tolerance Patient tolerated treatment well;Patient limited by pain   Behavior During Therapy [facility] for tasks assessed/performed      Past Medical History:  Diagnosis Date  . Afib ([place])   . Anemia   . CAD (coronary artery disease)   . Chronic diastolic heart failure ([place])   . HTN (hypertension)   . Mitral regurgitation    Moderate, echo, 07/2012  . Pulmonary hypertension ([place])     Past Surgical History:  Procedure Laterality Date  . JOINT REPLACEMENT Right Feb 2015   Dr. Case- Ledell Noss  . TOTAL KNEE ARTHROPLASTY Left     There were no vitals filed for this visit.       Subjective Assessment - 01/12/17 1201    Subjective Pt stated pain scale 4/10 for wound, took pain medication prior session.  Went to infection control center and reports they are going to hold the [facility] line and IV antibiotics until following the new wound treatment in [place] on Nov 5th.     Currently in Pain? Yes   Pain Score 4    Pain Location Ankle   Pain Orientation Right   Pain Descriptors / Indicators Burning;Stabbing   Pain Type Chronic pain;Neuropathic pain                   Wound Therapy - 01/12/17 1201    Subjective Pt stated pain scale 4/10 for wound, took pain medication prior session.  Went to infection control  center and reports they are going to hold the [facility] line and IV antibiotics until following the new wound treatment in [place] on Nov 5th.     Patient and Family Stated Goals For the wound to heal    Date of Onset 05/06/16   Prior Treatments antibiotics, self-dressing with xeroform/gauze    Pain Assessment No/denies pain   Evaluation and Treatment Procedures Explained to Patient/Family Yes   Evaluation and Treatment Procedures agreed to   Wound Properties Date First Assessed: 08/02/16 Time First Assessed: 1125 Wound Type: Venous stasis ulcer Location: Leg Location Orientation: Right;Lower Wound Description (Comments): R lateral lower LE  Present on Admission: Yes   Dressing Type Alginate;Compression wrap  honey alginate, ABD, profore with netting   Dressing Changed Changed   Dressing Status Clean;Dry;Intact   Dressing Change Frequency PRN   Site / Wound Assessment Clean;Dry   % Wound base Red or Granulating 20%   % Wound base Yellow/Fibrinous Exudate 80%   Peri-wound Assessment Edema;Hemosiderin   Wound Length (cm) 13.8 cm  medial wound 6.3, lateral at 7.5   Wound Width (cm) 9 cm  medial wound 3.7; lateral 5.3   Wound Depth (cm) 0.2 cm  was .2   Wound Volume (cm^3) 24.84 cm^3  Wound Surface Area (cm^2) 124.2 cm^2   Margins Attached edges (approximated)   Closure None   Drainage Amount Copious   Drainage Description Purulent   Treatment Cleansed;Debridement (Selective)   Selective Debridement - Location wound bed    Selective Debridement - Tools Used Forceps;Scalpel   Selective Debridement - Tissue Removed slough    Wound Therapy - Clinical Statement Wound continues to deteriorate. Her wound was improving when she was receiving IV antibiotics but immediately deteriorated once they were finished.  Her ABI is normal. She will begin treatment at the wound center for hyperbaric oxygen tank in November but is to continue with wound care in therapy in the meantime.  The Pt continues to be  limited by pain with minimal debridement availabiltiy due to pain.  Wound continues to increase in size and now on posterior ankle and medial aspect.  Pt stated a hold on IV antibiotics per infection control center until she begins the hyperbaric chamber on Nov 5th.  Continued with honey alginate and profore for compression.   Wound Therapy - Functional Problem List difficulty walking and standing due to pain    Factors Delaying/Impairing Wound Healing Infection - systemic/local;Vascular compromise   Hydrotherapy Plan Debridement;Dressing change;Patient/family education   Wound Therapy - Frequency --  2x/ week for 8 weeks followed by one time a week for 8 weeks    Wound Therapy - Current Recommendations PT   Wound Plan debridement, manual and dressing change using appropriate bandaging conducive to wound environment.  Measure weekly.   Dressing  medihoney alginate, abpad, profore                   PT Short Term Goals - 11/30/16 1131      PT SHORT TERM GOAL #1   Title Patient to experience pain no more than 5/10 to allow pt to be standing/walking  for 30 mintues in comfort to tolerate job activities.    Time 2   Period Weeks   Status Achieved     PT SHORT TERM GOAL #2   Title Wound granulationto have increased by at least 50% to decrease risk of infection.   Time 2   Period Weeks   Status Achieved     PT SHORT TERM GOAL #3   Title Pt drainage to be minimal to decrease risk of infection    Time 2   Period Weeks   Status Achieved           PT Long Term Goals - 11/30/16 1131      PT LONG TERM GOAL #1   Title  Pain 0/10 to allow pt to be up walking/standing for 2 hours without pain to be able to complete her work activity in comfort.    Time 4   Period Weeks   Status Partially Met     PT LONG TERM GOAL #2   Title Pt wound to be 100% granulated for reduced risk of infection    Time 4   Period Weeks   Status On-going     PT LONG TERM GOAL #3   Title Wound to  have decreased in size to no greater than 1x.5x .5  for pt to feel comfortable with self treatment.    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 PVD (peripheral vascular disease) ([place]) 07/05/2016  . Venous stasis ulcer ([place]) 03/15/2016  . Cellulitis 12/08/2015  . (HFpEF) heart failure with preserved ejection fraction ([place]) 04/16/2013  . Mitral regurgitation   . Anemia   . HTN (hypertension)   . Atrial fibrillation ([place]) 04/02/2009  . DIASTOLIC HEART FAILURE, CHRONIC 04/02/2009   Ihor Austin PTA [place], PT CLT [phone] 01/22/2017, 1:48 PM  [facility] [address] [place], [place], [address] Phone: [phone]   Fax:  [phone]  Name: Jody [name] MRN: [id number] Date of Birth: Dec 31, 1927

## 2017-01-12 NOTE — Therapy (Signed)
[facility] [address] [place], [place], [address] Phone: [phone]   Fax:  [phone]  Wound Care Therapy  Patient Details  Name: Jody [name] MRN: [id number] Date of Birth: 06/03/1927 Referring Provider: Evelina Dun  Encounter Date: 01/12/2017      PT End of Session - 01/12/17 1209    Visit Number 24   Number of Visits 24   Date for PT Re-Evaluation 01/16/17   Authorization Type [employer] medicare cert8/28 thru 80/03, g code at visit 18   Authorization - Visit Number 24   Authorization - Number of Visits 28   PT Start Time 1040   PT Stop Time 1120   PT Time Calculation (min) 40 min   Activity Tolerance Patient tolerated treatment well;Patient limited by pain   Behavior During Therapy [facility] for tasks assessed/performed      Past Medical History:  Diagnosis Date  . Afib ([place])   . Anemia   . CAD (coronary artery disease)   . Chronic diastolic heart failure ([place])   . HTN (hypertension)   . Mitral regurgitation    Moderate, echo, 07/2012  . Pulmonary hypertension ([place])     Past Surgical History:  Procedure Laterality Date  . JOINT REPLACEMENT Right Feb 2015   Dr. Case- Ledell Noss  . TOTAL KNEE ARTHROPLASTY Left     There were no vitals filed for this visit.       Subjective Assessment - 01/12/17 1201    Subjective Pt stated pain scale 4/10 for wound, took pain medication prior session.  Went to infection control center and reports they are going to hold the [facility] line and IV antibiotics until following the new wound treatment in [place] on Nov 5th.     Currently in Pain? Yes   Pain Score 4    Pain Location Ankle   Pain Orientation Right   Pain Descriptors / Indicators Burning;Stabbing   Pain Type Chronic pain;Neuropathic pain                   Wound Therapy - 01/12/17 1201    Subjective Pt stated pain scale 4/10 for wound, took pain medication prior session.  Went to infection control  center and reports they are going to hold the [facility] line and IV antibiotics until following the new wound treatment in [place] on Nov 5th.     Patient and Family Stated Goals For the wound to heal    Date of Onset 05/06/16   Prior Treatments antibiotics, self-dressing with xeroform/gauze    Pain Assessment No/denies pain   Evaluation and Treatment Procedures Explained to Patient/Family Yes   Evaluation and Treatment Procedures agreed to   Wound Properties Date First Assessed: 08/02/16 Time First Assessed: 1125 Wound Type: Venous stasis ulcer Location: Leg Location Orientation: Right;Lower Wound Description (Comments): R lateral lower LE  Present on Admission: Yes   Dressing Type Alginate;Compression wrap  honey alginate, ABD, profore with netting   Dressing Changed Changed   Dressing Status Clean;Dry;Intact   Dressing Change Frequency PRN   Site / Wound Assessment Clean;Dry   % Wound base Red or Granulating 20%   % Wound base Yellow/Fibrinous Exudate 80%   Peri-wound Assessment Edema;Hemosiderin   Wound Length (cm) 13.8 cm  medial wound 6.3, lateral at 7.5   Wound Width (cm) 9 cm  medial wound 3.7; lateral 5.3   Wound Depth (cm) 0.2 cm  was .2   Wound Volume (cm^3) 24.84 cm^3  Wound Surface Area (cm^2) 124.2 cm^2   Margins Attached edges (approximated)   Closure None   Drainage Amount Copious   Drainage Description Purulent   Treatment Cleansed;Debridement (Selective)   Selective Debridement - Location wound bed    Selective Debridement - Tools Used Forceps;Scalpel   Selective Debridement - Tissue Removed slough    Wound Therapy - Clinical Statement Pt continues to be limited by pain with minimal debridement availabiltiy due to pain.  Wound continues to increase in size and now on posterior ankle and medial aspect.  Pt stated a hold on IV antibiotics per infection control center until she begins the hyperbaric chamber on Nov 5th.  Continued with honey alginate and profore for  compression.   Wound Therapy - Functional Problem List difficulty walking and standing due to pain    Factors Delaying/Impairing Wound Healing Infection - systemic/local;Vascular compromise   Hydrotherapy Plan Debridement;Dressing change;Patient/family education   Wound Therapy - Frequency --  2x/ week for 4 weeks   Wound Therapy - Current Recommendations PT   Wound Plan debridement, manual and dressing change using appropriate bandaging conducive to wound environment.  Measure weekly.   Dressing  medihoney alginate, abpad, profore                   PT Short Term Goals - 11/30/16 1131      PT SHORT TERM GOAL #1   Title Patient to experience pain no more than 5/10 to allow pt to be standing/walking  for 30 mintues in comfort to tolerate job activities.    Time 2   Period Weeks   Status Achieved     PT SHORT TERM GOAL #2   Title Wound granulationto have increased by at least 50% to decrease risk of infection.   Time 2   Period Weeks   Status Achieved     PT SHORT TERM GOAL #3   Title Pt drainage to be minimal to decrease risk of infection    Time 2   Period Weeks   Status Achieved           PT Long Term Goals - 11/30/16 1131      PT LONG TERM GOAL #1   Title  Pain 0/10 to allow pt to be up walking/standing for 2 hours without pain to be able to complete her work activity in comfort.    Time 4   Period Weeks   Status Partially Met     PT LONG TERM GOAL #2   Title Pt wound to be 100% granulated for reduced risk of infection    Time 4   Period Weeks   Status On-going     PT LONG TERM GOAL #3   Title Wound to have decreased in size to no greater than 1x.5x .5  for pt to feel comfortable with self treatment.    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 PVD (peripheral vascular disease) ([place]) 07/05/2016  .  Venous stasis ulcer ([place]) 03/15/2016  . Cellulitis 12/08/2015  . (HFpEF) heart failure with preserved ejection fraction ([place]) 04/16/2013  . Mitral regurgitation   . Anemia   . HTN (hypertension)   . Atrial fibrillation ([place]) 04/02/2009  . DIASTOLIC HEART FAILURE, CHRONIC 04/02/2009   Ihor Austin, [place]; [place]  Aldona Lento 01/12/2017, 12:14 PM  [facility] [place], [place], [address] Phone: [phone]   Fax:  [phone]  Name: Jody [name] MRN: [id number] Date of Birth: [birth year]

## 2017-01-15 LAB — WOUND CULTURE
MICRO NUMBER:: 81129378
SPECIMEN QUALITY: ADEQUATE

## 2017-01-17 ENCOUNTER — Ambulatory Visit (HOSPITAL_COMMUNITY): Payer: Medicare Other | Admitting: Physical Therapy

## 2017-01-17 DIAGNOSIS — M79661 Pain in right lower leg: Secondary | ICD-10-CM

## 2017-01-17 DIAGNOSIS — M25571 Pain in right ankle and joints of right foot: Secondary | ICD-10-CM

## 2017-01-17 DIAGNOSIS — L97313 Non-pressure chronic ulcer of right ankle with necrosis of muscle: Secondary | ICD-10-CM | POA: Diagnosis not present

## 2017-01-17 DIAGNOSIS — R262 Difficulty in walking, not elsewhere classified: Secondary | ICD-10-CM | POA: Diagnosis not present

## 2017-01-17 DIAGNOSIS — I83013 Varicose veins of right lower extremity with ulcer of ankle: Secondary | ICD-10-CM | POA: Diagnosis not present

## 2017-01-17 DIAGNOSIS — L97311 Non-pressure chronic ulcer of right ankle limited to breakdown of skin: Secondary | ICD-10-CM | POA: Diagnosis not present

## 2017-01-17 NOTE — Therapy (Signed)
[facility] [address] [place], [place], [address] Phone: [phone]   Fax:  [phone]  Wound Care Therapy  Patient Details  Name: Jody [name] MRN: [id number] Date of Birth: 1927/11/24 Referring Provider: Evelina Dun   Encounter Date: 01/17/2017      PT End of Session - 01/17/17 1425    Visit Number 25   Number of Visits 28   Date for PT Re-Evaluation 01/16/17   Authorization Type [employer] medicare cert8/28 thru 89/37, g code at visit 18   Authorization - Visit Number 25   Authorization - Number of Visits 28   PT Start Time 1030   PT Stop Time 1113   PT Time Calculation (min) 43 min   Activity Tolerance Patient tolerated treatment well;Patient limited by pain   Behavior During Therapy [facility] for tasks assessed/performed      Past Medical History:  Diagnosis Date  . Afib ([place])   . Anemia   . CAD (coronary artery disease)   . Chronic diastolic heart failure ([place])   . HTN (hypertension)   . Mitral regurgitation    Moderate, echo, 07/2012  . Pulmonary hypertension ([place])     Past Surgical History:  Procedure Laterality Date  . JOINT REPLACEMENT Right Feb 2015   Dr. Case- Ledell Noss  . TOTAL KNEE ARTHROPLASTY Left     There were no vitals filed for this visit.                  Wound Therapy - 01/17/17 1418    Subjective Pt states that her leg hurts the whole day after we work on her but then it is painfree until we work on it again.  Pt does not go to the wound clinic until November.     Patient and Family Stated Goals For the wound to heal    Date of Onset 05/06/16   Prior Treatments antibiotics, self-dressing with xeroform/gauze    Evaluation and Treatment Procedures Explained to Patient/Family Yes   Evaluation and Treatment Procedures agreed to   Wound Properties Date First Assessed: 08/02/16 Time First Assessed: 1125 Wound Type: Venous stasis ulcer Location: Leg Location Orientation:  Right;Lower;Lateral Wound Description (Comments): R lateral lower LE  Present on Admission: Yes   Dressing Type Alginate;Compression wrap  honey alginate, ABD, profore with netting   Dressing Changed Changed   Dressing Status Clean;Dry;Intact   Dressing Change Frequency PRN   Site / Wound Assessment Clean;Dry   % Wound base Red or Granulating 20%   % Wound base Yellow/Fibrinous Exudate 80%   Peri-wound Assessment Edema;Hemosiderin   Wound Length (cm) 8 cm   Wound Width (cm) 4.5 cm   Wound Depth (cm) 0.2 cm   Wound Volume (cm^3) 7.2 cm^3   Wound Surface Area (cm^2) 36 cm^2   Margins Attached edges (approximated)   Closure None   Drainage Amount Copious   Drainage Description Purulent   Treatment Cleansed;Debridement (Selective)   Wound Properties Date First Assessed: 01/17/17 Time First Assessed: 1100 Wound Type: Venous stasis ulcer Location: Leg Location Orientation: Right;Lower Wound Description (Comments): medial aspect of Rt leg  Present on Admission: No   Dressing Type Compression wrap   Dressing Changed Changed   Dressing Status Old drainage   Dressing Change Frequency PRN   Site / Wound Assessment Painful;Granulation tissue;Yellow   % Wound base Red or Granulating 85%   % Wound base Yellow/Fibrinous Exudate 15%   Wound Length (cm) 6.5 cm  Wound Width (cm) 4.2 cm   Wound Depth (cm) 0.1 cm   Wound Volume (cm^3) 2.73 cm^3   Wound Surface Area (cm^2) 27.3 cm^2   Drainage Amount Moderate   Drainage Description Serous   Treatment Cleansed;Debridement (Selective)   Selective Debridement - Location wound bed    Selective Debridement - Tools Used Forceps;Scalpel   Selective Debridement - Tissue Removed slough    Wound Therapy - Clinical Statement Upon closer investigation pt wound does not totally wrap around her leg; she has two distinctively different wounds; one on the medial and one on the lateral aspect.  Therapist was unable to complete very much debridement due to the pain  level it brought on to the patient.  Both wounds covered with silverhydrofiber that had a thin layer of medihoney placed on it by the therapist followed by profore with extra cotton used on the foot and ankle for proper shaping.    Wound Therapy - Functional Problem List difficulty walking and standing due to pain    Factors Delaying/Impairing Wound Healing Infection - systemic/local;Vascular compromise   Hydrotherapy Plan Debridement;Dressing change;Patient/family education   Wound Therapy - Frequency --  2x/ week for 4 weeks   Wound Therapy - Current Recommendations PT   Wound Plan debridement, manual and dressing change using appropriate bandaging conducive to wound environment.  Measure weekly.   Dressing  medihoney ; silverhydrofiber,profore                   PT Short Term Goals - 11/30/16 1131      PT SHORT TERM GOAL #1   Title Patient to experience pain no more than 5/10 to allow pt to be standing/walking  for 30 mintues in comfort to tolerate job activities.    Time 2   Period Weeks   Status Achieved     PT SHORT TERM GOAL #2   Title Wound granulationto have increased by at least 50% to decrease risk of infection.   Time 2   Period Weeks   Status Achieved     PT SHORT TERM GOAL #3   Title Pt drainage to be minimal to decrease risk of infection    Time 2   Period Weeks   Status Achieved           PT Long Term Goals - 11/30/16 1131      PT LONG TERM GOAL #1   Title  Pain 0/10 to allow pt to be up walking/standing for 2 hours without pain to be able to complete her work activity in comfort.    Time 4   Period Weeks   Status Partially Met     PT LONG TERM GOAL #2   Title Pt wound to be 100% granulated for reduced risk of infection    Time 4   Period Weeks   Status On-going     PT LONG TERM GOAL #3   Title Wound to have decreased in size to no greater than 1x.5x .5  for pt to feel comfortable with self treatment.    Time 4   Period Weeks   Status  On-going               Plan - 01/17/17 1426    Clinical Impression Statement Pt is awaiting treatment in the hyperbaric chamber to attempt to heal wound.   Pt will continue to benefit from weekly wound check , debridement and cleansings until tisis abailable to assure that her wound is not  regressing.    Rehab Potential Fair   PT Frequency 2x / week   PT Duration 8 weeks  followed by once a week for the  x 6 weeks ; an additional 3 more weeks    PT Treatment/Interventions ADLs/Self Care Home Management;Other (comment)  debridement as tolerated    PT Next Visit Plan Continue to measure wound each time pt comes in.     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 PVD (peripheral vascular disease) ([place]) 07/05/2016  . Venous stasis ulcer ([place]) 03/15/2016  . Cellulitis 12/08/2015  . (HFpEF) heart failure with preserved ejection fraction ([place]) 04/16/2013  . Mitral regurgitation   . Anemia   . HTN (hypertension)   . Atrial fibrillation ([place]) 04/02/2009  . DIASTOLIC HEART FAILURE, CHRONIC 04/02/2009    Rayetta Humphrey, PT CLT [phone] 01/17/2017, 2:30 PM  [facility] [address] [place], [place], [address] Phone: [phone]   Fax:  [phone]  Name: Jody [name] MRN: [id number] Date of Birth: December 10, 1927

## 2017-01-26 ENCOUNTER — Ambulatory Visit (HOSPITAL_COMMUNITY): Payer: Medicare Other | Admitting: Physical Therapy

## 2017-01-26 DIAGNOSIS — M79661 Pain in right lower leg: Secondary | ICD-10-CM | POA: Diagnosis not present

## 2017-01-26 DIAGNOSIS — L97313 Non-pressure chronic ulcer of right ankle with necrosis of muscle: Secondary | ICD-10-CM

## 2017-01-26 DIAGNOSIS — I83013 Varicose veins of right lower extremity with ulcer of ankle: Secondary | ICD-10-CM | POA: Diagnosis not present

## 2017-01-26 DIAGNOSIS — L97311 Non-pressure chronic ulcer of right ankle limited to breakdown of skin: Secondary | ICD-10-CM | POA: Diagnosis not present

## 2017-01-26 DIAGNOSIS — R262 Difficulty in walking, not elsewhere classified: Secondary | ICD-10-CM | POA: Diagnosis not present

## 2017-01-26 DIAGNOSIS — M25571 Pain in right ankle and joints of right foot: Secondary | ICD-10-CM | POA: Diagnosis not present

## 2017-01-26 NOTE — Therapy (Signed)
[facility] [place], [place], [address] Phone: [phone]   Fax:  [phone]  Wound Care Therapy  Patient Details  Name: Jody [name] MRN: [id number] Date of Birth: 02-19-1928 Referring Provider: Evelina Dun   Encounter Date: 01/26/2017      PT End of Session - 01/26/17 1234    Visit Number 26   Number of Visits 28   Date for PT Re-Evaluation 01/16/17   Authorization Type [employer] medicare cert8/28 thru 61/44, g code at visit 18   Authorization - Visit Number 26   Authorization - Number of Visits 28   PT Start Time 1030   PT Stop Time 1115   PT Time Calculation (min) 45 min   Activity Tolerance Patient tolerated treatment well;Patient limited by pain   Behavior During Therapy [facility] for tasks assessed/performed      Past Medical History:  Diagnosis Date  . Afib ([place])   . Anemia   . CAD (coronary artery disease)   . Chronic diastolic heart failure ([place])   . HTN (hypertension)   . Mitral regurgitation    Moderate, echo, 07/2012  . Pulmonary hypertension ([place])     Past Surgical History:  Procedure Laterality Date  . JOINT REPLACEMENT Right Feb 2015   Dr. Case- Ledell Noss  . TOTAL KNEE ARTHROPLASTY Left     There were no vitals filed for this visit.       Subjective Assessment - 01/26/17 1222                    Wound Therapy - 01/26/17 1224    Subjective Pt states that her pain has increased it is now a 510    Patient and Family Stated Goals For the wound to heal    Date of Onset 05/06/16   Prior Treatments antibiotics, self-dressing with xeroform/gauze    Pain Assessment 0-10   Pain Score 5    Pain Type Chronic pain   Pain Location Leg   Pain Intervention(s) Emotional support   Evaluation and Treatment Procedures Explained to Patient/Family Yes   Evaluation and Treatment Procedures agreed to   Wound Properties Date First Assessed: 08/02/16 Time First Assessed: 1125 Wound Type:  Venous stasis ulcer Location: Leg Location Orientation: Right;Lower;Lateral Wound Description (Comments): R lateral lower LE  Present on Admission: Yes   Dressing Type Alginate;Compression wrap  honey alginate, ABD, profore with netting   Dressing Changed Changed   Dressing Status Clean;Dry;Intact   Dressing Change Frequency PRN   Site / Wound Assessment Clean;Dry   % Wound base Red or Granulating 75%   % Wound base Yellow/Fibrinous Exudate 25%   Peri-wound Assessment Edema;Hemosiderin   Wound Length (cm) 9 cm  was 8   Wound Width (cm) 4.5 cm  was 4.5    Wound Surface Area (cm^2) 40.5 cm^2   Margins Attached edges (approximated)   Closure None   Drainage Amount Copious   Drainage Description Purulent   Treatment Cleansed;Debridement (Selective)   Wound Properties Date First Assessed: 01/17/17 Time First Assessed: 1100 Wound Type: Venous stasis ulcer Location: Leg Location Orientation: Right;Lower Wound Description (Comments): medial aspect of Rt leg  Present on Admission: No   Dressing Type Compression wrap   Dressing Changed Changed   Dressing Status Old drainage   Dressing Change Frequency PRN   Site / Wound Assessment Painful;Granulation tissue;Yellow   % Wound base Red or Granulating 85%   % Wound base Yellow/Fibrinous Exudate  15%   Peri-wound Assessment Maceration   Wound Length (cm) 8 cm  was 6.5   Wound Width (cm) 3.8 cm  was 4.2   Wound Surface Area (cm^2) 30.4 cm^2   Drainage Amount Copious   Drainage Description Serous;Purulent   Treatment Cleansed;Debridement (Selective)   Selective Debridement - Location wound bed    Selective Debridement - Tools Used Forceps;Scalpel   Selective Debridement - Tissue Removed slough    Wound Therapy - Clinical Statement Pt wound continues to increase in size and drainage.  I have mentioned antibiotics to the family but the family state that the infection control MD does not want to place her on antibiotic until  she has been seen at  the wound center.    Wound Therapy - Functional Problem List difficulty walking and standing due to pain    Factors Delaying/Impairing Wound Healing Infection - systemic/local;Vascular compromise   Hydrotherapy Plan Debridement;Dressing change;Patient/family education   Wound Therapy - Frequency --  2x/ week for 4 weeks   Wound Therapy - Current Recommendations PT   Wound Plan debridement, manual and dressing change using appropriate bandaging conducive to wound environment.  Measure weekly.   Dressing  medihoney alginate followed by calcium alginate dressing followed by profore.                   PT Short Term Goals - 11/30/16 1131      PT SHORT TERM GOAL #1   Title Patient to experience pain no more than 5/10 to allow pt to be standing/walking  for 30 mintues in comfort to tolerate job activities.    Time 2   Period Weeks   Status Achieved     PT SHORT TERM GOAL #2   Title Wound granulationto have increased by at least 50% to decrease risk of infection.   Time 2   Period Weeks   Status Achieved     PT SHORT TERM GOAL #3   Title Pt drainage to be minimal to decrease risk of infection    Time 2   Period Weeks   Status Achieved           PT Long Term Goals - 11/30/16 1131      PT LONG TERM GOAL #1   Title  Pain 0/10 to allow pt to be up walking/standing for 2 hours without pain to be able to complete her work activity in comfort.    Time 4   Period Weeks   Status Partially Met     PT LONG TERM GOAL #2   Title Pt wound to be 100% granulated for reduced risk of infection    Time 4   Period Weeks   Status On-going     PT LONG TERM GOAL #3   Title Wound to have decreased in size to no greater than 1x.5x .5  for pt to feel comfortable with self treatment.    Time 4   Period Weeks   Status On-going               Plan - 01/26/17 1234    Clinical Impression Statement as above   Rehab Potential Fair   PT Frequency 2x / week   PT Duration 8 weeks   followed by once a week for the  x 6 weeks ; an additional 3 more weeks    PT Treatment/Interventions ADLs/Self Care Home Management;Other (comment)  debridement as tolerated    PT Next Visit Plan Continue to measure  wound each time pt comes in.     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 PVD (peripheral vascular disease) ([place]) 07/05/2016  . Venous stasis ulcer ([place]) 03/15/2016  . Cellulitis 12/08/2015  . (HFpEF) heart failure with preserved ejection fraction ([place]) 04/16/2013  . Mitral regurgitation   . Anemia   . HTN (hypertension)   . Atrial fibrillation ([place]) 04/02/2009  . DIASTOLIC HEART FAILURE, CHRONIC 04/02/2009   Rayetta Humphrey, PT CLT [phone] 01/26/2017, 12:35 PM  [facility] [address] [place], [place], [address] Phone: [phone]   Fax:  [phone]  Name: Jody [name] MRN: [id number] Date of Birth: 1928-02-23

## 2017-02-03 ENCOUNTER — Ambulatory Visit (HOSPITAL_COMMUNITY): Payer: Medicare Other | Attending: Family

## 2017-02-03 ENCOUNTER — Encounter (HOSPITAL_COMMUNITY): Payer: Self-pay

## 2017-02-03 DIAGNOSIS — M79661 Pain in right lower leg: Secondary | ICD-10-CM | POA: Diagnosis not present

## 2017-02-03 DIAGNOSIS — I83013 Varicose veins of right lower extremity with ulcer of ankle: Secondary | ICD-10-CM | POA: Insufficient documentation

## 2017-02-03 DIAGNOSIS — L97313 Non-pressure chronic ulcer of right ankle with necrosis of muscle: Secondary | ICD-10-CM | POA: Diagnosis not present

## 2017-02-03 DIAGNOSIS — M25571 Pain in right ankle and joints of right foot: Secondary | ICD-10-CM | POA: Diagnosis not present

## 2017-02-03 DIAGNOSIS — R262 Difficulty in walking, not elsewhere classified: Secondary | ICD-10-CM | POA: Diagnosis not present

## 2017-02-03 NOTE — Therapy (Signed)
[place], [place], [address] Phone: [phone]   Fax:  [phone]  Wound Care Therapy (Discharge)  Patient Details  Name: Jody [name] MRN: [id number] Date of Birth: [birth year] Referring Provider: Evelina Dun   Encounter Date: 02/10/2017   PHYSICAL THERAPY DISCHARGE SUMMARY  Visits from Start of Care: 27  Current functional level related to goals / functional outcomes: Wound has not improved with skilled PT services and has actually increased in size; she is transferring to hyperbaric O2 chamber for ongoing care at this point.    Remaining deficits: Non-healing wound    Education / Equipment: See below  Plan: Patient agrees to discharge.  Patient goals were partially met. Patient is being discharged due to lack of progress.  ?????     G-CODES 2017-02-10 (Other PT primary) Based on wound status/granulation Goal CK DC CL   Deniece Ree PT, DPT [id number]       PT End of Session - 02-10-2017 1256    Visit Number 27   Number of Visits 28   Date for PT Re-Evaluation 01/16/17   Authorization Type [employer] medicare cert8/28 thru 38/18, g code at visit 18   Authorization - Visit Number 27   Authorization - Number of Visits 28   PT Start Time 2993   PT Stop Time 1122   PT Time Calculation (min) 47 min   Activity Tolerance Patient tolerated treatment well;Patient limited by pain   Behavior During Therapy WFL for tasks assessed/performed      Past Medical History:  Diagnosis Date  . Afib ([place])   . Anemia   . CAD (coronary artery disease)   . Chronic diastolic heart failure ([place])   . HTN (hypertension)   . Mitral regurgitation    Moderate, echo, 07/2012  . Pulmonary hypertension ([place])     Past Surgical History:  Procedure Laterality Date  . JOINT REPLACEMENT Right Feb 2015   Dr. Case- [facility] Noss  . TOTAL KNEE ARTHROPLASTY Left     There were no vitals filed for this visit.        Subjective Assessment - 02/10/2017 1215    Subjective Pt stated pain scale 5/10 on wound medial aspect of LE.  Begins new wound therapy next monday in [facility]                   Wound Therapy - Feb 10, 2017 1216    Subjective Pt stated pain scale 5/10 on wound medial aspect of LE.  Begins new wound therapy next monday in [place]   Patient and Family Stated Goals For the wound to heal    Date of Onset 05/06/16   Prior Treatments antibiotics, self-dressing with xeroform/gauze    Pain Assessment 0-10   Pain Score 5    Pain Type Chronic pain   Pain Location Leg   Pain Orientation Right;Medial   Pain Descriptors / Indicators Burning   Pain Onset On-going   Patients Stated Pain Goal 2   Pain Intervention(s) Emotional support;Repositioned  Daughter support   Multiple Pain Sites No   Evaluation and Treatment Procedures Explained to Patient/Family Yes   Evaluation and Treatment Procedures agreed to   Wound Properties Date First Assessed: 01/17/17 Time First Assessed: 1100 Wound Type: Venous stasis ulcer Location: Leg Location Orientation: Right;Lower Wound Description (Comments): medial aspect of Rt leg  Present on Admission: No   Dressing Type Alginate;Compression wrap  medihoney, calcium alginate, profore with extra foam  Dressing Changed Changed   Dressing Status Old drainage   Dressing Change Frequency PRN   Site / Wound Assessment Painful;Granulation tissue;Yellow   % Wound base Red or Granulating 85%   % Wound base Yellow/Fibrinous Exudate 15%   Peri-wound Assessment Erythema (non-blanchable)   Wound Length (cm) 8.6 cm  was 8cm last session   Wound Width (cm) 4.2 cm  was 3.8   Wound Depth (cm) 0.1 cm   Wound Volume (cm^3) 3.61 cm^3   Wound Surface Area (cm^2) 36.12 cm^2   Drainage Amount Copious   Drainage Description Serous;Purulent   Treatment Cleansed;Debridement (Selective)   Wound Properties Date First Assessed: 08/02/16 Time First Assessed: 1125 Wound Type:  Venous stasis ulcer Location: Leg Location Orientation: Right;Lower;Lateral Wound Description (Comments): R lateral lower LE  Present on Admission: Yes   Dressing Type Alginate;Compression wrap  medihoney, calcium alginate, profore with extra foam   Dressing Changed Changed   Dressing Status Clean;Dry;Intact   Dressing Change Frequency PRN   Site / Wound Assessment Clean;Dry   % Wound base Red or Granulating 80%   % Wound base Yellow/Fibrinous Exudate 20%   Peri-wound Assessment Edema;Hemosiderin   Wound Length (cm) 8.5 cm   Wound Width (cm) 4.7 cm   Wound Depth (cm) 0.2 cm  noted hypergranulation at distal    Wound Volume (cm^3) 7.99 cm^3   Wound Surface Area (cm^2) 39.95 cm^2   Margins Attached edges (approximated)   Closure None   Drainage Amount Copious   Drainage Description Purulent   Treatment Cleansed;Debridement (Selective)   Selective Debridement - Location wound bed    Selective Debridement - Tools Used Forceps;Scalpel   Selective Debridement - Tissue Removed slough    Wound Therapy - Clinical Statement Pt wound continues to increased in size with copious drainage, odor and redness border.  Pt limited by pain with debridement.  Continued selective debridement for removal of slough with appropriate dressings.  Continued wiht profore for edema control.  NOted some hypergranulation on distal aspect of lateral wound.  Pt with plans to begin hyperbaric oxygen tank on Monday.   Wound Therapy - Functional Problem List difficulty walking and standing due to pain    Factors Delaying/Impairing Wound Healing Infection - systemic/local;Vascular compromise   Hydrotherapy Plan Debridement;Dressing change;Patient/family education   Wound Therapy - Frequency --  2x/week for 4 weeks   Wound Therapy - Current Recommendations PT   Wound Plan pt to begin hyperbaric oxygen therapy on Monday.   Dressing  medihoney alginate followed by calcium alginate dressing followed by profore.                    PT Short Term Goals - 11/30/16 1131      PT SHORT TERM GOAL #1   Title Patient to experience pain no more than 5/10 to allow pt to be standing/walking  for 30 mintues in comfort to tolerate job activities.    Time 2   Period Weeks   Status Achieved     PT SHORT TERM GOAL #2   Title Wound granulationto have increased by at least 50% to decrease risk of infection.   Time 2   Period Weeks   Status Achieved     PT SHORT TERM GOAL #3   Title Pt drainage to be minimal to decrease risk of infection    Time 2   Period Weeks   Status Achieved           PT Long Term Goals -  11/30/16 1131      PT LONG TERM GOAL #1   Title  Pain 0/10 to allow pt to be up walking/standing for 2 hours without pain to be able to complete her work activity in comfort.    Time 4   Period Weeks   Status Partially Met     PT LONG TERM GOAL #2   Title Pt wound to be 100% granulated for reduced risk of infection    Time 4   Period Weeks   Status On-going     PT LONG TERM GOAL #3   Title Wound to have decreased in size to no greater than 1x.5x .5  for pt to feel comfortable with self treatment.    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 PVD (peripheral vascular disease) ([place]) 07/05/2016  . Venous stasis ulcer ([place]) 03/15/2016  . Cellulitis 12/08/2015  . (HFpEF) heart failure with preserved ejection fraction ([place]) 04/16/2013  . Mitral regurgitation   . Anemia   . HTN (hypertension)   . Atrial fibrillation ([place]) 04/02/2009   . DIASTOLIC HEART FAILURE, CHRONIC 04/02/2009   Ihor Austin, [place]; [place]  Aldona Lento 02/03/2017, 12:59 PM  [facility] [address] [place], [place], [address] Phone: [phone]   Fax:  [phone]  Name: Jody [name] MRN: [id number] Date of Birth: 06-20-1927

## 2017-02-06 ENCOUNTER — Encounter (HOSPITAL_BASED_OUTPATIENT_CLINIC_OR_DEPARTMENT_OTHER): Payer: Medicare Other | Attending: Internal Medicine

## 2017-02-06 DIAGNOSIS — I340 Nonrheumatic mitral (valve) insufficiency: Secondary | ICD-10-CM | POA: Insufficient documentation

## 2017-02-06 DIAGNOSIS — L97312 Non-pressure chronic ulcer of right ankle with fat layer exposed: Secondary | ICD-10-CM | POA: Diagnosis not present

## 2017-02-06 DIAGNOSIS — I4891 Unspecified atrial fibrillation: Secondary | ICD-10-CM | POA: Insufficient documentation

## 2017-02-06 DIAGNOSIS — I87331 Chronic venous hypertension (idiopathic) with ulcer and inflammation of right lower extremity: Secondary | ICD-10-CM | POA: Diagnosis not present

## 2017-02-06 DIAGNOSIS — I87311 Chronic venous hypertension (idiopathic) with ulcer of right lower extremity: Secondary | ICD-10-CM | POA: Diagnosis not present

## 2017-02-06 DIAGNOSIS — I5032 Chronic diastolic (congestive) heart failure: Secondary | ICD-10-CM | POA: Insufficient documentation

## 2017-02-06 DIAGNOSIS — Z7901 Long term (current) use of anticoagulants: Secondary | ICD-10-CM | POA: Diagnosis not present

## 2017-02-06 DIAGNOSIS — I272 Pulmonary hypertension, unspecified: Secondary | ICD-10-CM | POA: Insufficient documentation

## 2017-02-06 DIAGNOSIS — I739 Peripheral vascular disease, unspecified: Secondary | ICD-10-CM | POA: Diagnosis not present

## 2017-02-06 DIAGNOSIS — I110 Hypertensive heart disease with heart failure: Secondary | ICD-10-CM | POA: Diagnosis not present

## 2017-02-06 DIAGNOSIS — I2510 Atherosclerotic heart disease of native coronary artery without angina pectoris: Secondary | ICD-10-CM | POA: Diagnosis not present

## 2017-02-06 DIAGNOSIS — I70233 Atherosclerosis of native arteries of right leg with ulceration of ankle: Secondary | ICD-10-CM | POA: Diagnosis not present

## 2017-02-13 ENCOUNTER — Other Ambulatory Visit: Payer: Self-pay | Admitting: Internal Medicine

## 2017-02-13 ENCOUNTER — Ambulatory Visit (HOSPITAL_COMMUNITY)
Admission: RE | Admit: 2017-02-13 | Discharge: 2017-02-13 | Disposition: A | Discharge status: Home / Self Care | Payer: Medicare Other | Source: Ambulatory Visit | Attending: Vascular Surgery | Admitting: Vascular Surgery

## 2017-02-13 DIAGNOSIS — L97919 Non-pressure chronic ulcer of unspecified part of right lower leg with unspecified severity: Secondary | ICD-10-CM

## 2017-02-13 DIAGNOSIS — Z7901 Long term (current) use of anticoagulants: Secondary | ICD-10-CM | POA: Diagnosis not present

## 2017-02-13 DIAGNOSIS — L97312 Non-pressure chronic ulcer of right ankle with fat layer exposed: Secondary | ICD-10-CM | POA: Diagnosis not present

## 2017-02-13 DIAGNOSIS — I110 Hypertensive heart disease with heart failure: Secondary | ICD-10-CM | POA: Diagnosis not present

## 2017-02-13 DIAGNOSIS — I340 Nonrheumatic mitral (valve) insufficiency: Secondary | ICD-10-CM | POA: Diagnosis not present

## 2017-02-13 DIAGNOSIS — L97812 Non-pressure chronic ulcer of other part of right lower leg with fat layer exposed: Secondary | ICD-10-CM | POA: Diagnosis not present

## 2017-02-13 DIAGNOSIS — I2510 Atherosclerotic heart disease of native coronary artery without angina pectoris: Secondary | ICD-10-CM | POA: Diagnosis not present

## 2017-02-13 DIAGNOSIS — I87311 Chronic venous hypertension (idiopathic) with ulcer of right lower extremity: Secondary | ICD-10-CM | POA: Diagnosis not present

## 2017-02-13 DIAGNOSIS — I5032 Chronic diastolic (congestive) heart failure: Secondary | ICD-10-CM | POA: Diagnosis not present

## 2017-02-13 DIAGNOSIS — I70238 Atherosclerosis of native arteries of right leg with ulceration of other part of lower right leg: Secondary | ICD-10-CM | POA: Diagnosis not present

## 2017-02-13 DIAGNOSIS — I872 Venous insufficiency (chronic) (peripheral): Secondary | ICD-10-CM | POA: Diagnosis not present

## 2017-02-13 DIAGNOSIS — I87331 Chronic venous hypertension (idiopathic) with ulcer and inflammation of right lower extremity: Secondary | ICD-10-CM | POA: Diagnosis not present

## 2017-02-13 DIAGNOSIS — I739 Peripheral vascular disease, unspecified: Secondary | ICD-10-CM | POA: Diagnosis not present

## 2017-02-13 DIAGNOSIS — I272 Pulmonary hypertension, unspecified: Secondary | ICD-10-CM | POA: Diagnosis not present

## 2017-02-13 DIAGNOSIS — I4891 Unspecified atrial fibrillation: Secondary | ICD-10-CM | POA: Diagnosis not present

## 2017-02-20 ENCOUNTER — Encounter: Payer: Self-pay | Admitting: Internal Medicine

## 2017-02-20 ENCOUNTER — Ambulatory Visit (INDEPENDENT_AMBULATORY_CARE_PROVIDER_SITE_OTHER): Payer: Medicare Other | Admitting: Internal Medicine

## 2017-02-20 DIAGNOSIS — L97101 Non-pressure chronic ulcer of unspecified thigh limited to breakdown of skin: Secondary | ICD-10-CM

## 2017-02-20 DIAGNOSIS — L97812 Non-pressure chronic ulcer of other part of right lower leg with fat layer exposed: Secondary | ICD-10-CM | POA: Diagnosis not present

## 2017-02-20 DIAGNOSIS — I2510 Atherosclerotic heart disease of native coronary artery without angina pectoris: Secondary | ICD-10-CM | POA: Diagnosis not present

## 2017-02-20 DIAGNOSIS — I87311 Chronic venous hypertension (idiopathic) with ulcer of right lower extremity: Secondary | ICD-10-CM | POA: Diagnosis not present

## 2017-02-20 DIAGNOSIS — I5032 Chronic diastolic (congestive) heart failure: Secondary | ICD-10-CM | POA: Diagnosis not present

## 2017-02-20 DIAGNOSIS — I110 Hypertensive heart disease with heart failure: Secondary | ICD-10-CM | POA: Diagnosis not present

## 2017-02-20 DIAGNOSIS — L97312 Non-pressure chronic ulcer of right ankle with fat layer exposed: Secondary | ICD-10-CM | POA: Diagnosis not present

## 2017-02-20 DIAGNOSIS — I70232 Atherosclerosis of native arteries of right leg with ulceration of calf: Secondary | ICD-10-CM | POA: Diagnosis not present

## 2017-02-20 DIAGNOSIS — I4891 Unspecified atrial fibrillation: Secondary | ICD-10-CM | POA: Diagnosis not present

## 2017-02-20 DIAGNOSIS — I83001 Varicose veins of unspecified lower extremity with ulcer of thigh: Secondary | ICD-10-CM

## 2017-02-20 DIAGNOSIS — I272 Pulmonary hypertension, unspecified: Secondary | ICD-10-CM | POA: Diagnosis not present

## 2017-02-20 DIAGNOSIS — L97212 Non-pressure chronic ulcer of right calf with fat layer exposed: Secondary | ICD-10-CM | POA: Diagnosis not present

## 2017-02-20 DIAGNOSIS — I739 Peripheral vascular disease, unspecified: Secondary | ICD-10-CM | POA: Diagnosis not present

## 2017-02-20 DIAGNOSIS — I340 Nonrheumatic mitral (valve) insufficiency: Secondary | ICD-10-CM | POA: Diagnosis not present

## 2017-02-20 DIAGNOSIS — I87331 Chronic venous hypertension (idiopathic) with ulcer and inflammation of right lower extremity: Secondary | ICD-10-CM | POA: Diagnosis not present

## 2017-02-20 DIAGNOSIS — Z7901 Long term (current) use of anticoagulants: Secondary | ICD-10-CM | POA: Diagnosis not present

## 2017-02-20 NOTE — Progress Notes (Signed)
[facility] for Infectious Disease  Patient Active Problem List   Diagnosis Date Noted  . Venous stasis ulcer ([place]) 03/15/2016    Priority: High  . PVD (peripheral vascular disease) ([place]) 07/05/2016  . Cellulitis 12/08/2015  . (HFpEF) heart failure with preserved ejection fraction ([place]) 04/16/2013  . Mitral regurgitation   . Anemia   . HTN (hypertension)   . Atrial fibrillation ([place]) 04/02/2009  . DIASTOLIC HEART FAILURE, CHRONIC 04/02/2009      Medication List        Accurate as of 02/20/17  4:04 PM. Always use your most recent med list.          apixaban 2.5 MG Tabs tablet Commonly known as:  ELIQUIS Take 1 tablet (2.5 mg total) by mouth 2 (two) times daily.   CARTIA XT 240 MG 24 hr capsule Generic drug:  diltiazem   ferrous sulfate 325 (65 FE) MG tablet   furosemide 20 MG tablet Commonly known as:  LASIX Take 1 tablet (20 mg total) by mouth daily as needed for edema.   HYDROcodone-acetaminophen 5-325 MG tablet Commonly known as:  NORCO/VICODIN Take 1 tablet by mouth every 12 (twelve) hours as needed for moderate pain.   vitamin B-12 1000 MCG tablet Commonly known as:  CYANOCOBALAMIN       Subjective: Jody [name] is in for her routine follow-up visit. She is accompanied by her granddaughter. She has had 2 visits to the wound center [facility] recently. She was changed to a Betadine compression wrap that is being changed weekly. Her dressing was changed earlier today. She said the area looked cleaner and did not require debridement. She says that the [employer].  Review of Systems: Review of Systems  Constitutional: Negative for chills, diaphoresis and fever.  HENT:       She is hard of hearing.  Musculoskeletal:       She is having a great deal of pain related to the dressing change.    Past Medical History:  Diagnosis Date  . Afib ([place])   . Anemia   . CAD (coronary artery disease)   . Chronic diastolic heart failure  ([place])   . HTN (hypertension)   . Mitral regurgitation    Moderate, echo, 07/2012  . Pulmonary hypertension ([facility])     Social History   Tobacco Use  . Smoking status: Never Smoker  . Smokeless tobacco: Never Used  Substance Use Topics  . Alcohol use: No  . Drug use: No    Family History  Problem Relation Age of Onset  . Diabetes Mother     Allergies  Allergen Reactions  . Levaquin [Levofloxacin] Other (See Comments)    Patient stated vision became blurry and became weak.    Objective: Vitals:   02/20/17 1529  BP: 122/80  Pulse: 71  Temp: 98.4 F (36.9 C)  TempSrc: Oral  Weight: 111 lb (50.3 kg)  Height: 5\' 2"  (1.575 m)   Body mass index is 20.3 kg/m.  Physical Exam  Constitutional:  She is in good spirits.  Skin:  She has a compression Ace right foot to the level of the knee. It is clean and dry.    Lab Results    Problem List Items Addressed This Visit      High   Venous stasis ulcer ([place])    She has venous insufficiency with a chronic venous stasis ulcer. Her wound is colonized with Pseudomonas but she has  no signs to suggest active infection requiring treatment. She seems to be improving with wound care. She can follow-up here as needed.          Michel Bickers, MD [facility] for [facility] [phone] pager   [phone] cell 02/20/2017, 4:04 PM

## 2017-02-20 NOTE — Assessment & Plan Note (Signed)
She has venous insufficiency with a chronic venous stasis ulcer. Her wound is colonized with Pseudomonas but she has no signs to suggest active infection requiring treatment. She seems to be improving with wound care. She can follow-up here as needed.

## 2017-02-27 DIAGNOSIS — I739 Peripheral vascular disease, unspecified: Secondary | ICD-10-CM | POA: Diagnosis not present

## 2017-02-27 DIAGNOSIS — I110 Hypertensive heart disease with heart failure: Secondary | ICD-10-CM | POA: Diagnosis not present

## 2017-02-27 DIAGNOSIS — I87331 Chronic venous hypertension (idiopathic) with ulcer and inflammation of right lower extremity: Secondary | ICD-10-CM | POA: Diagnosis not present

## 2017-02-27 DIAGNOSIS — Z7901 Long term (current) use of anticoagulants: Secondary | ICD-10-CM | POA: Diagnosis not present

## 2017-02-27 DIAGNOSIS — I5032 Chronic diastolic (congestive) heart failure: Secondary | ICD-10-CM | POA: Diagnosis not present

## 2017-02-27 DIAGNOSIS — I340 Nonrheumatic mitral (valve) insufficiency: Secondary | ICD-10-CM | POA: Diagnosis not present

## 2017-02-27 DIAGNOSIS — I4891 Unspecified atrial fibrillation: Secondary | ICD-10-CM | POA: Diagnosis not present

## 2017-02-27 DIAGNOSIS — I2510 Atherosclerotic heart disease of native coronary artery without angina pectoris: Secondary | ICD-10-CM | POA: Diagnosis not present

## 2017-02-27 DIAGNOSIS — L97812 Non-pressure chronic ulcer of other part of right lower leg with fat layer exposed: Secondary | ICD-10-CM | POA: Diagnosis not present

## 2017-02-27 DIAGNOSIS — L97212 Non-pressure chronic ulcer of right calf with fat layer exposed: Secondary | ICD-10-CM | POA: Diagnosis not present

## 2017-02-27 DIAGNOSIS — I87311 Chronic venous hypertension (idiopathic) with ulcer of right lower extremity: Secondary | ICD-10-CM | POA: Diagnosis not present

## 2017-02-27 DIAGNOSIS — I272 Pulmonary hypertension, unspecified: Secondary | ICD-10-CM | POA: Diagnosis not present

## 2017-02-27 DIAGNOSIS — L97312 Non-pressure chronic ulcer of right ankle with fat layer exposed: Secondary | ICD-10-CM | POA: Diagnosis not present

## 2017-03-01 ENCOUNTER — Encounter: Payer: Self-pay | Admitting: Vascular Surgery

## 2017-03-01 ENCOUNTER — Ambulatory Visit (INDEPENDENT_AMBULATORY_CARE_PROVIDER_SITE_OTHER): Payer: Medicare Other | Admitting: Vascular Surgery

## 2017-03-01 VITALS — BP 143/81 | HR 77 | Temp 97.6°F | Ht 62.0 in | Wt 112.0 lb

## 2017-03-01 DIAGNOSIS — I4891 Unspecified atrial fibrillation: Secondary | ICD-10-CM | POA: Diagnosis not present

## 2017-03-01 DIAGNOSIS — I110 Hypertensive heart disease with heart failure: Secondary | ICD-10-CM | POA: Diagnosis not present

## 2017-03-01 DIAGNOSIS — I2510 Atherosclerotic heart disease of native coronary artery without angina pectoris: Secondary | ICD-10-CM | POA: Diagnosis not present

## 2017-03-01 DIAGNOSIS — I87331 Chronic venous hypertension (idiopathic) with ulcer and inflammation of right lower extremity: Secondary | ICD-10-CM | POA: Diagnosis not present

## 2017-03-01 DIAGNOSIS — I5032 Chronic diastolic (congestive) heart failure: Secondary | ICD-10-CM | POA: Diagnosis not present

## 2017-03-01 DIAGNOSIS — I739 Peripheral vascular disease, unspecified: Secondary | ICD-10-CM | POA: Diagnosis not present

## 2017-03-01 DIAGNOSIS — I340 Nonrheumatic mitral (valve) insufficiency: Secondary | ICD-10-CM | POA: Diagnosis not present

## 2017-03-01 DIAGNOSIS — I272 Pulmonary hypertension, unspecified: Secondary | ICD-10-CM | POA: Diagnosis not present

## 2017-03-01 DIAGNOSIS — I872 Venous insufficiency (chronic) (peripheral): Secondary | ICD-10-CM | POA: Diagnosis not present

## 2017-03-01 DIAGNOSIS — L97312 Non-pressure chronic ulcer of right ankle with fat layer exposed: Secondary | ICD-10-CM | POA: Diagnosis not present

## 2017-03-01 DIAGNOSIS — Z7901 Long term (current) use of anticoagulants: Secondary | ICD-10-CM | POA: Diagnosis not present

## 2017-03-01 NOTE — Progress Notes (Signed)
Patient name: Jody [name] MRN: [id number] DOB: 1927-10-08 Sex: female  REASON FOR VISIT:   Follow-up of venous stasis ulcer.  HPI:   Jody [name] is a pleasant 83 y.o. female who I saw in consultation on 08/24/2016 with a venous stasis ulcer of the right lower extremity.  The patient was referred by [facility].  She has had a wound off and on for well over a year and has been doing compression therapy and aggressive wound care for this.  She has a history of recurrent venous stasis ulcers of the right lower extremity.  Arterial Doppler study at that time showed an ABI of 96% on the right with a toe pressure of 106 mmHg.  ABI on the left was also 100% with a toe pressure of 121 mmHg.  I felt that she had adequate circulation to heal the wound from an arterial standpoint.  She could continue elevation and compression therapy.   She is sent [name] by Dr. Dellia Nims because of an abnormal venous study.  The results of the study are discussed below.  She has had these venous ulcers for about a year and a half.  She gets compression dressings placed at the wound care center at [facility].  She elevates her leg.  She is made no real progress.  I do not get any history of claudication or rest pain.  Past Medical History:  Diagnosis Date  . Afib ([place])   . Anemia   . CAD (coronary artery disease)   . Chronic diastolic heart failure ([place])   . HTN (hypertension)   . Mitral regurgitation    Moderate, echo, 07/2012  . Pulmonary hypertension ([facility])     Family History  Problem Relation Age of Onset  . Diabetes Mother     SOCIAL HISTORY: Social History   Tobacco Use  . Smoking status: Never Smoker  . Smokeless tobacco: Never Used  Substance Use Topics  . Alcohol use: No    Allergies  Allergen Reactions  . Levaquin [Levofloxacin] Other (See Comments)    Patient stated vision became blurry and became weak.    Current Outpatient Medications    Medication Sig Dispense Refill  . apixaban (ELIQUIS) 2.5 MG TABS tablet Take 1 tablet (2.5 mg total) by mouth 2 (two) times daily. 180 tablet 3  . diltiazem (CARTIA XT) 240 MG 24 hr capsule Take 240 mg by mouth daily.    . ferrous sulfate 325 (65 FE) MG tablet Take 325 mg by mouth.    . furosemide (LASIX) 20 MG tablet Take 1 tablet (20 mg total) by mouth daily as needed for edema. 30 tablet 3  . HYDROcodone-acetaminophen (NORCO/VICODIN) 5-325 MG tablet Take 1 tablet by mouth every 12 (twelve) hours as needed for moderate pain. 60 tablet 0  . vitamin B-12 (CYANOCOBALAMIN) 1000 MCG tablet Take 1,000 mcg by mouth daily.     No current facility-administered medications for this visit.     REVIEW OF SYSTEMS:  [X]  denotes positive finding, [ ]  denotes negative finding Cardiac  Comments:  Chest pain or chest pressure:    Shortness of breath upon exertion:    Short of breath when lying flat:    Irregular heart rhythm:        Vascular    Pain in calf, thigh, or hip brought on by ambulation:    Pain in feet at night that wakes you up from your sleep:     Blood  clot in your veins:    Leg swelling:         Pulmonary    Oxygen at home:    Productive cough:     Wheezing:         Neurologic    Sudden weakness in arms or legs:     Sudden numbness in arms or legs:     Sudden onset of difficulty speaking or slurred speech:    Temporary loss of vision in one eye:     Problems with dizziness:         Gastrointestinal    Blood in stool:     Vomited blood:         Genitourinary    Burning when urinating:     Blood in urine:        Psychiatric    Major depression:         Hematologic    Bleeding problems:    Problems with blood clotting too easily:        Skin    Rashes or ulcers: X       Constitutional    Fever or chills:     PHYSICAL EXAM:   Vitals:   03/01/17 1419  BP: (!) 143/81  Pulse: 77  Temp: 97.6 F (36.4 C)  TempSrc: Oral  SpO2: 96%  Weight: 112 lb (50.8 kg)   Height: 5\' 2"  (1.575 m)    GENERAL: The patient is a well-nourished female, in no acute distress. The vital signs are documented above. CARDIAC: There is a regular rate and rhythm.  VASCULAR: I do not detect carotid bruits.  She has palpable femoral, popliteal, dorsalis pedis pulses bilaterally. PULMONARY: There is good air exchange bilaterally without wheezing or rales. ABDOMEN: Soft and non-tender with normal pitched bowel sounds.  MUSCULOSKELETAL: There are no major deformities or cyanosis. NEUROLOGIC: No focal weakness or paresthesias are detected. SKIN: There are no ulcers or rashes noted. PSYCHIATRIC: The patient has a normal affect.  DATA:    VENOUS REFLUX STUDY: I have reviewed the venous reflux study that was done on 02/13/2017.  On the right side, which was the side with the ulcer, there is no evidence of deep venous thrombosis or superficial thrombophlebitis.  There is reflux in the deep system involving the common femoral vein and femoral vein.  There is reflux in the superficial system involving the right great saphenous vein.  There is also reflux in the saphenofemoral junction.  On the left side there is no evidence of DVT or superficial thrombophlebitis.  There is reflux in the deep system involving the common femoral vein and femoral vein.  There is reflux in the superficial system involving the right great saphenous vein.  There is also reflux in the saphenofemoral junction.  MEDICAL ISSUES:   VENOUS STASIS ULCERS RIGHT LOWER EXTREMITY: This patient has had venous stasis ulcers of the right leg for over a year and a half she has had aggressive outpatient care with compression therapy and aggressive wound care.  She is been elevating her legs.  The wounds have not made significant progress.  Based on her venous reflux study, I think she would benefit from endovenous thermal ablation of the right great saphenous vein.  We will set her up for an appointment with Dr. Donnetta Hutching.   The patient's daughter had seen Dr. Donnetta Hutching in the past and was requesting him.  I think from an arterial standpoint she still has a palpable dorsalis pedis pulse on the  right with brisk Doppler signals.  Deitra Mayo [facility] [phone]

## 2017-03-06 ENCOUNTER — Encounter (HOSPITAL_BASED_OUTPATIENT_CLINIC_OR_DEPARTMENT_OTHER): Payer: Medicare Other | Attending: Internal Medicine

## 2017-03-06 DIAGNOSIS — I2510 Atherosclerotic heart disease of native coronary artery without angina pectoris: Secondary | ICD-10-CM | POA: Diagnosis not present

## 2017-03-06 DIAGNOSIS — I87311 Chronic venous hypertension (idiopathic) with ulcer of right lower extremity: Secondary | ICD-10-CM | POA: Diagnosis not present

## 2017-03-06 DIAGNOSIS — L97312 Non-pressure chronic ulcer of right ankle with fat layer exposed: Secondary | ICD-10-CM | POA: Diagnosis not present

## 2017-03-06 DIAGNOSIS — I87331 Chronic venous hypertension (idiopathic) with ulcer and inflammation of right lower extremity: Secondary | ICD-10-CM | POA: Insufficient documentation

## 2017-03-06 DIAGNOSIS — I509 Heart failure, unspecified: Secondary | ICD-10-CM | POA: Insufficient documentation

## 2017-03-06 DIAGNOSIS — I110 Hypertensive heart disease with heart failure: Secondary | ICD-10-CM | POA: Insufficient documentation

## 2017-03-06 DIAGNOSIS — L97812 Non-pressure chronic ulcer of other part of right lower leg with fat layer exposed: Secondary | ICD-10-CM | POA: Diagnosis not present

## 2017-03-15 ENCOUNTER — Other Ambulatory Visit: Payer: Self-pay

## 2017-03-15 ENCOUNTER — Encounter: Payer: Self-pay | Admitting: Vascular Surgery

## 2017-03-15 ENCOUNTER — Ambulatory Visit (INDEPENDENT_AMBULATORY_CARE_PROVIDER_SITE_OTHER): Payer: Medicare Other | Admitting: Vascular Surgery

## 2017-03-15 ENCOUNTER — Ambulatory Visit: Payer: Medicare Other | Admitting: Vascular Surgery

## 2017-03-15 VITALS — BP 129/88 | HR 76 | Temp 97.5°F | Resp 14 | Ht 62.0 in | Wt 110.0 lb

## 2017-03-15 DIAGNOSIS — I83019 Varicose veins of right lower extremity with ulcer of unspecified site: Secondary | ICD-10-CM | POA: Diagnosis not present

## 2017-03-15 DIAGNOSIS — I509 Heart failure, unspecified: Secondary | ICD-10-CM | POA: Diagnosis not present

## 2017-03-15 DIAGNOSIS — I872 Venous insufficiency (chronic) (peripheral): Secondary | ICD-10-CM | POA: Diagnosis not present

## 2017-03-15 DIAGNOSIS — L97212 Non-pressure chronic ulcer of right calf with fat layer exposed: Secondary | ICD-10-CM | POA: Diagnosis not present

## 2017-03-15 DIAGNOSIS — L97919 Non-pressure chronic ulcer of unspecified part of right lower leg with unspecified severity: Secondary | ICD-10-CM

## 2017-03-15 DIAGNOSIS — I2510 Atherosclerotic heart disease of native coronary artery without angina pectoris: Secondary | ICD-10-CM | POA: Diagnosis not present

## 2017-03-15 DIAGNOSIS — I87331 Chronic venous hypertension (idiopathic) with ulcer and inflammation of right lower extremity: Secondary | ICD-10-CM | POA: Diagnosis not present

## 2017-03-15 DIAGNOSIS — L97312 Non-pressure chronic ulcer of right ankle with fat layer exposed: Secondary | ICD-10-CM | POA: Diagnosis not present

## 2017-03-15 DIAGNOSIS — I110 Hypertensive heart disease with heart failure: Secondary | ICD-10-CM | POA: Diagnosis not present

## 2017-03-15 DIAGNOSIS — I87311 Chronic venous hypertension (idiopathic) with ulcer of right lower extremity: Secondary | ICD-10-CM | POA: Diagnosis not present

## 2017-03-15 NOTE — Progress Notes (Signed)
[facility]  Patient name: Jody [name] MRN: [id number] DOB: 08-04-1927 Sex: female  REASON FOR VISIT: Continued discussion and evaluation of severe venous hypertension and recurrent right ankle venous ulcer  HPI: Jody [name] is a 83 y.o. female here today for continued evaluation of her severe venous hypertension.  She is here today with her daughter.  She has had past history of bleeding from superficial varices at her right ankle reports that she has had this chronic ulcer over the course of approximately 2 years and has had healing and this is now the third recurrence.  She has a large venous ulcer which does cause her is been treated with compression wraps and topical treatment at wound center for several months now.  Very slow improvement with this.  Undergone prior noninvasive arterial study showing healing.  She did undergo venous reflux study on reflux in both great saphenous vein bilaterally with dilatation bilateral.  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  Has been told she cannot have any consideration of hip surgery until she has complete healing of her ulceration  Past Medical History:  Diagnosis Date  . Afib ([place])   . Anemia   . CAD (coronary artery disease)   . Chronic diastolic heart failure ([place])   . HTN (hypertension)   . Mitral regurgitation    Moderate, echo, 07/2012  . Pulmonary hypertension ([facility])     Family History  Problem Relation Age of Onset  . Diabetes Mother     SOCIAL HISTORY: Social History   Tobacco Use  . Smoking status: Never Smoker  . Smokeless tobacco: Never Used  Substance Use Topics  . Alcohol use: No    Allergies  Allergen Reactions  . Levaquin [Levofloxacin] Other (See Comments)    Patient stated vision became blurry and became weak.    Current Outpatient Medications  Medication  Sig Dispense Refill  . apixaban (ELIQUIS) 2.5 MG TABS tablet Take 1 tablet (2.5 mg total) by mouth 2 (two) times daily. 180 tablet 3  . diltiazem (CARTIA XT) 240 MG 24 hr capsule Take 240 mg by mouth daily.    . ferrous sulfate 325 (65 FE) MG tablet Take 325 mg by mouth.    . furosemide (LASIX) 20 MG tablet Take 1 tablet (20 mg total) by mouth daily as needed for edema. 30 tablet 3  . traMADol (ULTRAM) 50 MG tablet Take 50-100 mg by mouth.    . vitamin B-12 (CYANOCOBALAMIN) 1000 MCG tablet Take 1,000 mcg by mouth daily.    Marland Kitchen HYDROcodone-acetaminophen (NORCO/VICODIN) 5-325 MG tablet Take 1 tablet by mouth every 12 (twelve) hours as needed for moderate pain. (Patient not taking: Reported on 03/15/2017) 60 tablet 0   No current facility-administered medications for this visit.     REVIEW OF SYSTEMS:  [X]  denotes positive finding, [ ]  denotes negative finding Cardiac  Comments:  Chest pain or chest pressure:    Shortness of breath upon exertion:    Short of breath when lying flat:    Irregular heart rhythm:        Vascular    Pain in calf, thigh, or hip brought on by ambulation:    Pain in feet at night that wakes you up from your sleep:     Blood clot in your veins:    Leg swelling:  x  PHYSICAL EXAM: Vitals:   03/15/17 1137 03/15/17 1140  BP: (!) 168/87 129/88  Pulse: 90 76  Resp: 14   Temp: (!) 97.5 F (36.4 C)   SpO2: 99%   Weight: 110 lb (49.9 kg)   Height: 5\' 2"  (1.575 m)     GENERAL: The patient is a well-nourished female, in no acute distress. The vital signs are documented above. CARDIOVASCULAR: Palpable popliteal pulses bilaterally. PULMONARY: There is good air exchange  MUSCULOSKELETAL: There are no major deformities or cyanosis. NEUROLOGIC: No focal weakness or paresthesias are detected. SKIN: Severe circumferential changes of venous hypertension bilaterally with very thickened hard skin and circumferential hemosiderin deposits bilaterally.  On the right  leg she has a large 2 x 4 cm ulceration aspect of her ankle.  On the right leg multiple superficial telangiectasia raised above the surface. PSYCHIATRIC: The patient has a normal affect.  DATA:  Duplex from 02/13/2017 reveals severe venous hypertension with incompetence in the common femoral vein and femoral vein bilaterally and also reflux throughout the great saphenous vein bilaterally with dilatation by bilaterally  MEDICAL ISSUES: Severe venous hypertension both lower extremities.  Right leg has 3 different recurrences.  Left leg continues to have a marked progressive changes of venous hypertension despite elevation and graduated compression.  I have recommended laser ablation of her right great saphenous vein for improvement in her superficial component of her venous hypertension.  Also would recommend staged left leg laser ablation of her left great saphenous vein and also sclerotherapy of these raised telangiectasia.  These look extremely high risk for bleeding and this is what started the ulceration on her right leg.  She wishes to proceed as soon as possible.  I discussed the procedure as an outpatient under local anesthesia with the patient and her daughter present    Rosetta Posner, MD [facility] Tel [phone] Pager [phone]

## 2017-03-20 DIAGNOSIS — I87311 Chronic venous hypertension (idiopathic) with ulcer of right lower extremity: Secondary | ICD-10-CM | POA: Diagnosis not present

## 2017-03-20 DIAGNOSIS — I110 Hypertensive heart disease with heart failure: Secondary | ICD-10-CM | POA: Diagnosis not present

## 2017-03-20 DIAGNOSIS — L97811 Non-pressure chronic ulcer of other part of right lower leg limited to breakdown of skin: Secondary | ICD-10-CM | POA: Diagnosis not present

## 2017-03-20 DIAGNOSIS — I2510 Atherosclerotic heart disease of native coronary artery without angina pectoris: Secondary | ICD-10-CM | POA: Diagnosis not present

## 2017-03-20 DIAGNOSIS — I87331 Chronic venous hypertension (idiopathic) with ulcer and inflammation of right lower extremity: Secondary | ICD-10-CM | POA: Diagnosis not present

## 2017-03-20 DIAGNOSIS — L97312 Non-pressure chronic ulcer of right ankle with fat layer exposed: Secondary | ICD-10-CM | POA: Diagnosis not present

## 2017-03-20 DIAGNOSIS — I509 Heart failure, unspecified: Secondary | ICD-10-CM | POA: Diagnosis not present

## 2017-03-29 DIAGNOSIS — I87311 Chronic venous hypertension (idiopathic) with ulcer of right lower extremity: Secondary | ICD-10-CM | POA: Diagnosis not present

## 2017-03-29 DIAGNOSIS — I110 Hypertensive heart disease with heart failure: Secondary | ICD-10-CM | POA: Diagnosis not present

## 2017-03-29 DIAGNOSIS — I87331 Chronic venous hypertension (idiopathic) with ulcer and inflammation of right lower extremity: Secondary | ICD-10-CM | POA: Diagnosis not present

## 2017-03-29 DIAGNOSIS — I2510 Atherosclerotic heart disease of native coronary artery without angina pectoris: Secondary | ICD-10-CM | POA: Diagnosis not present

## 2017-03-29 DIAGNOSIS — L97312 Non-pressure chronic ulcer of right ankle with fat layer exposed: Secondary | ICD-10-CM | POA: Diagnosis not present

## 2017-03-29 DIAGNOSIS — I509 Heart failure, unspecified: Secondary | ICD-10-CM | POA: Diagnosis not present

## 2017-04-03 DIAGNOSIS — I87331 Chronic venous hypertension (idiopathic) with ulcer and inflammation of right lower extremity: Secondary | ICD-10-CM | POA: Diagnosis not present

## 2017-04-03 DIAGNOSIS — I2510 Atherosclerotic heart disease of native coronary artery without angina pectoris: Secondary | ICD-10-CM | POA: Diagnosis not present

## 2017-04-03 DIAGNOSIS — I509 Heart failure, unspecified: Secondary | ICD-10-CM | POA: Diagnosis not present

## 2017-04-03 DIAGNOSIS — L97312 Non-pressure chronic ulcer of right ankle with fat layer exposed: Secondary | ICD-10-CM | POA: Diagnosis not present

## 2017-04-03 DIAGNOSIS — I110 Hypertensive heart disease with heart failure: Secondary | ICD-10-CM | POA: Diagnosis not present

## 2017-04-03 DIAGNOSIS — I87311 Chronic venous hypertension (idiopathic) with ulcer of right lower extremity: Secondary | ICD-10-CM | POA: Diagnosis not present

## 2017-04-05 ENCOUNTER — Other Ambulatory Visit: Payer: Self-pay | Admitting: *Deleted

## 2017-04-05 DIAGNOSIS — I83892 Varicose veins of left lower extremities with other complications: Secondary | ICD-10-CM

## 2017-04-05 DIAGNOSIS — I83891 Varicose veins of right lower extremities with other complications: Secondary | ICD-10-CM

## 2017-04-10 ENCOUNTER — Encounter (HOSPITAL_BASED_OUTPATIENT_CLINIC_OR_DEPARTMENT_OTHER): Payer: Medicare Other | Attending: Internal Medicine

## 2017-04-10 ENCOUNTER — Ambulatory Visit (INDEPENDENT_AMBULATORY_CARE_PROVIDER_SITE_OTHER): Payer: Medicare Other | Admitting: Family

## 2017-04-10 ENCOUNTER — Encounter: Payer: Self-pay | Admitting: Family

## 2017-04-10 VITALS — BP 144/81 | HR 73 | Temp 96.6°F | Ht 62.0 in | Wt 112.0 lb

## 2017-04-10 DIAGNOSIS — I83001 Varicose veins of unspecified lower extremity with ulcer of thigh: Secondary | ICD-10-CM | POA: Diagnosis not present

## 2017-04-10 DIAGNOSIS — M150 Primary generalized (osteo)arthritis: Secondary | ICD-10-CM | POA: Diagnosis not present

## 2017-04-10 DIAGNOSIS — I509 Heart failure, unspecified: Secondary | ICD-10-CM | POA: Insufficient documentation

## 2017-04-10 DIAGNOSIS — M159 Polyosteoarthritis, unspecified: Secondary | ICD-10-CM

## 2017-04-10 DIAGNOSIS — I2510 Atherosclerotic heart disease of native coronary artery without angina pectoris: Secondary | ICD-10-CM | POA: Insufficient documentation

## 2017-04-10 DIAGNOSIS — I110 Hypertensive heart disease with heart failure: Secondary | ICD-10-CM | POA: Diagnosis not present

## 2017-04-10 DIAGNOSIS — L97101 Non-pressure chronic ulcer of unspecified thigh limited to breakdown of skin: Secondary | ICD-10-CM | POA: Diagnosis not present

## 2017-04-10 DIAGNOSIS — I87331 Chronic venous hypertension (idiopathic) with ulcer and inflammation of right lower extremity: Secondary | ICD-10-CM | POA: Diagnosis not present

## 2017-04-10 DIAGNOSIS — L97812 Non-pressure chronic ulcer of other part of right lower leg with fat layer exposed: Secondary | ICD-10-CM | POA: Diagnosis not present

## 2017-04-10 DIAGNOSIS — I87311 Chronic venous hypertension (idiopathic) with ulcer of right lower extremity: Secondary | ICD-10-CM | POA: Diagnosis not present

## 2017-04-10 DIAGNOSIS — R630 Anorexia: Secondary | ICD-10-CM

## 2017-04-10 MED ORDER — TRAMADOL HCL 50 MG PO TABS: 50.0000 mg | ORAL_TABLET | Freq: Two times a day (BID) | ORAL | 5 refills | Status: DC | PRN

## 2017-04-10 MED ORDER — CYPROHEPTADINE HCL 2 MG/5ML PO SYRP: ORAL_SOLUTION | ORAL | 2 refills | Status: AC

## 2017-04-10 NOTE — Patient Instructions (Signed)
Failure to Thrive, Adult Failure to thrive is a group of problems. These problems include eating too little and losing weight. People who have this condition may do fewer and fewer activities over time. They may lose interest in being with friends or they may not want to eat or drink. Follow these instructions at home:  Take medicines only as told by your doctor.  Eat a healthy, well-balanced diet. Make sure that you eat enough.  Be active. Do strength training. A physical therapist can help to set up an exercise program that fits you.  Make sure that you are safe at home.  Make sure that you have a plan for what to do if you cannot make decisions for yourself. Contact a doctor if:  You are not able to eat well.  You are not able to move around.  You feel very sad.  You feel very hopeless. Get help right away if:  You think about ending your life.  You cannot eat or drink.  You do not get out of bed.  Staying at home is not safe.  You have a fever. This information is not intended to replace advice given to you by your health care provider. Make sure you discuss any questions you have with your health care provider. Document Released: 03/10/2011 Document Revised: 08/27/2015 Document Reviewed: 06/16/2014 Elsevier Interactive Patient Education  2018 [employer]  

## 2017-04-10 NOTE — Progress Notes (Signed)
   Subjective:    Patient ID: Jody [name], female    DOB: 06-04-1927, 84 y.o.   MRN: [id number]  HPI PT states she has chronic pain in her left hip and left knee of 5 out 10. States it is worse when she stands for long periods of times. Pt states Ultram helps with this pain. She is followed by Wound Care weekly for a vascular ulcer on right lower leg.    PT states she has lost 15-25 pounds over the last few years can not gain any weight. Pt states she has tried protein shakes, Ensure drinks, supplements with no relief.    Review of Systems  Musculoskeletal: Positive for arthralgias.  Skin: Positive for wound.  All other systems reviewed and are negative.      Objective:   Physical Exam  Constitutional: She is oriented to person, place, and time. She appears well-developed and well-nourished. No distress.  HENT:  Head: Normocephalic and atraumatic.  Eyes: Pupils are equal, round, and reactive to light.  Neck: Normal range of motion. Neck supple. No thyromegaly present.  Cardiovascular: Normal rate, regular rhythm and intact distal pulses.  Murmur heard. Pulmonary/Chest: Effort normal and breath sounds normal. No respiratory distress. She has no wheezes.  Abdominal: Soft. Bowel sounds are normal. She exhibits no distension. There is no tenderness.  Musculoskeletal: Normal range of motion. She exhibits no edema or tenderness.  Neurological: She is alert and oriented to person, place, and time.  Skin: Skin is warm and dry.  Right leg dressing intact, not removed since it was placed today by wound care   Psychiatric: She has a normal mood and affect. Her behavior is normal. Judgment and thought content normal.  Vitals reviewed.     BP (!) 144/81   Pulse 73   Temp (!) 96.6 F (35.9 C) (Oral)   Ht 5\' 2"  (1.575 m)   Wt 112 lb (50.8 kg)   BMI 20.49 kg/m      Assessment & Plan:  1. Primary osteoarthritis involving multiple joints - traMADol (ULTRAM) 50 MG tablet; Take 1-2  tablets (50-100 mg total) by mouth every 12 (twelve) hours as needed.  Dispense: 60 tablet; Refill: 5  2. Venous stasis ulcer of thigh limited to breakdown of skin with varicose veins, unspecified laterality (HCC) - traMADol (ULTRAM) 50 MG tablet; Take 1-2 tablets (50-100 mg total) by mouth every 12 (twelve) hours as needed.  Dispense: 60 tablet; Refill: 5  3. Decreased appetite - cyproheptadine (PERIACTIN) 2 MG/5ML syrup; Take 5 mLs (2 mg total) by mouth every 8 (eight) hours for 7 days, THEN 10 mLs (4 mg total) every 8 (eight) hours.  Dispense: 120 mL; Refill: 2  Discussed importance of eating regular meals, continue Protein shakes with meals Continue appts with wound care RTO 2 months    Evelina Dun, FNP

## 2017-04-20 DIAGNOSIS — I2510 Atherosclerotic heart disease of native coronary artery without angina pectoris: Secondary | ICD-10-CM | POA: Diagnosis not present

## 2017-04-20 DIAGNOSIS — I110 Hypertensive heart disease with heart failure: Secondary | ICD-10-CM | POA: Diagnosis not present

## 2017-04-20 DIAGNOSIS — I509 Heart failure, unspecified: Secondary | ICD-10-CM | POA: Diagnosis not present

## 2017-04-20 DIAGNOSIS — I87311 Chronic venous hypertension (idiopathic) with ulcer of right lower extremity: Secondary | ICD-10-CM | POA: Diagnosis not present

## 2017-04-20 DIAGNOSIS — L97812 Non-pressure chronic ulcer of other part of right lower leg with fat layer exposed: Secondary | ICD-10-CM | POA: Diagnosis not present

## 2017-04-20 DIAGNOSIS — I87331 Chronic venous hypertension (idiopathic) with ulcer and inflammation of right lower extremity: Secondary | ICD-10-CM | POA: Diagnosis not present

## 2017-04-27 DIAGNOSIS — I509 Heart failure, unspecified: Secondary | ICD-10-CM | POA: Diagnosis not present

## 2017-04-27 DIAGNOSIS — L97812 Non-pressure chronic ulcer of other part of right lower leg with fat layer exposed: Secondary | ICD-10-CM | POA: Diagnosis not present

## 2017-04-27 DIAGNOSIS — I87331 Chronic venous hypertension (idiopathic) with ulcer and inflammation of right lower extremity: Secondary | ICD-10-CM | POA: Diagnosis not present

## 2017-04-27 DIAGNOSIS — I110 Hypertensive heart disease with heart failure: Secondary | ICD-10-CM | POA: Diagnosis not present

## 2017-04-27 DIAGNOSIS — L97312 Non-pressure chronic ulcer of right ankle with fat layer exposed: Secondary | ICD-10-CM | POA: Diagnosis not present

## 2017-04-27 DIAGNOSIS — I87311 Chronic venous hypertension (idiopathic) with ulcer of right lower extremity: Secondary | ICD-10-CM | POA: Diagnosis not present

## 2017-04-27 DIAGNOSIS — I2510 Atherosclerotic heart disease of native coronary artery without angina pectoris: Secondary | ICD-10-CM | POA: Diagnosis not present

## 2017-05-04 DIAGNOSIS — I509 Heart failure, unspecified: Secondary | ICD-10-CM | POA: Diagnosis not present

## 2017-05-04 DIAGNOSIS — L97312 Non-pressure chronic ulcer of right ankle with fat layer exposed: Secondary | ICD-10-CM | POA: Diagnosis not present

## 2017-05-04 DIAGNOSIS — L97812 Non-pressure chronic ulcer of other part of right lower leg with fat layer exposed: Secondary | ICD-10-CM | POA: Diagnosis not present

## 2017-05-04 DIAGNOSIS — I87331 Chronic venous hypertension (idiopathic) with ulcer and inflammation of right lower extremity: Secondary | ICD-10-CM | POA: Diagnosis not present

## 2017-05-04 DIAGNOSIS — I87311 Chronic venous hypertension (idiopathic) with ulcer of right lower extremity: Secondary | ICD-10-CM | POA: Diagnosis not present

## 2017-05-04 DIAGNOSIS — I2510 Atherosclerotic heart disease of native coronary artery without angina pectoris: Secondary | ICD-10-CM | POA: Diagnosis not present

## 2017-05-04 DIAGNOSIS — I110 Hypertensive heart disease with heart failure: Secondary | ICD-10-CM | POA: Diagnosis not present

## 2017-05-11 ENCOUNTER — Encounter (HOSPITAL_BASED_OUTPATIENT_CLINIC_OR_DEPARTMENT_OTHER): Payer: Medicare Other | Attending: Internal Medicine

## 2017-05-11 DIAGNOSIS — L97212 Non-pressure chronic ulcer of right calf with fat layer exposed: Secondary | ICD-10-CM | POA: Diagnosis not present

## 2017-05-11 DIAGNOSIS — I87331 Chronic venous hypertension (idiopathic) with ulcer and inflammation of right lower extremity: Secondary | ICD-10-CM | POA: Insufficient documentation

## 2017-05-11 DIAGNOSIS — I87311 Chronic venous hypertension (idiopathic) with ulcer of right lower extremity: Secondary | ICD-10-CM | POA: Diagnosis not present

## 2017-05-11 DIAGNOSIS — L97812 Non-pressure chronic ulcer of other part of right lower leg with fat layer exposed: Secondary | ICD-10-CM | POA: Diagnosis not present

## 2017-05-11 DIAGNOSIS — L97312 Non-pressure chronic ulcer of right ankle with fat layer exposed: Secondary | ICD-10-CM | POA: Diagnosis not present

## 2017-05-18 ENCOUNTER — Ambulatory Visit: Payer: Medicare Other | Admitting: Vascular Surgery

## 2017-05-18 ENCOUNTER — Encounter: Payer: Self-pay | Admitting: Vascular Surgery

## 2017-05-18 VITALS — BP 173/94 | HR 79 | Temp 97.6°F | Resp 16 | Ht 62.0 in | Wt 115.0 lb

## 2017-05-18 DIAGNOSIS — L97919 Non-pressure chronic ulcer of unspecified part of right lower leg with unspecified severity: Secondary | ICD-10-CM | POA: Diagnosis not present

## 2017-05-18 DIAGNOSIS — I83019 Varicose veins of right lower extremity with ulcer of unspecified site: Secondary | ICD-10-CM | POA: Diagnosis not present

## 2017-05-18 DIAGNOSIS — I87311 Chronic venous hypertension (idiopathic) with ulcer of right lower extremity: Secondary | ICD-10-CM | POA: Diagnosis not present

## 2017-05-18 DIAGNOSIS — L97812 Non-pressure chronic ulcer of other part of right lower leg with fat layer exposed: Secondary | ICD-10-CM | POA: Diagnosis not present

## 2017-05-18 DIAGNOSIS — L97312 Non-pressure chronic ulcer of right ankle with fat layer exposed: Secondary | ICD-10-CM | POA: Diagnosis not present

## 2017-05-18 DIAGNOSIS — I87331 Chronic venous hypertension (idiopathic) with ulcer and inflammation of right lower extremity: Secondary | ICD-10-CM | POA: Diagnosis not present

## 2017-05-18 DIAGNOSIS — L97212 Non-pressure chronic ulcer of right calf with fat layer exposed: Secondary | ICD-10-CM | POA: Diagnosis not present

## 2017-05-18 DIAGNOSIS — I872 Venous insufficiency (chronic) (peripheral): Secondary | ICD-10-CM | POA: Diagnosis not present

## 2017-05-18 NOTE — Progress Notes (Signed)
     Laser Ablation Procedure    Date: 05/18/2017   Jody [name] DOB:09-25-1927  Consent signed: Yes    Surgeon:  Dr. Sherren Mocha Ophelia Sipe  Procedure: Laser Ablation: right Greater Saphenous Vein  BP (!) 173/94   Pulse 79   Temp 97.6 F (36.4 C)   Resp 16   Ht 5\' 2"  (1.575 m)   Wt 115 lb (52.2 kg)   SpO2 97%   BMI 21.03 kg/m   Tumescent Anesthesia: 200 cc 0.9% NaCl with 50 cc Lidocaine HCL with 1% Epi and 15 cc 8.4% NaHCO3  Local Anesthesia: 3 cc Lidocaine HCL and NaHCO3 (ratio 2:1)  15 watts continuous mode        Total energy: 1177   Total time: 1:18    Patient tolerated procedure well  Notes:   Description of Procedure:  After marking the course of the secondary varicosities, the patient was placed on the operating table in the supine position, and the right leg was prepped and draped in sterile fashion.   Local anesthetic was administered and under ultrasound guidance the saphenous vein was accessed with a micro needle and guide wire; then the mirco puncture sheath was placed.  A guide wire was inserted saphenofemoral junction , followed by a 5 french sheath.  The position of the sheath and then the laser fiber below the junction was confirmed using the ultrasound.  Tumescent anesthesia was administered along the course of the saphenous vein using ultrasound guidance. The patient was placed in Trendelenburg position and protective laser glasses were placed on patient and staff, and the laser was fired at 15 watts continuous mode advancing 1-41mm/second for a total of 1177 joules.     Steri strips were applied to the stab wounds and ABD pads and thigh high compression stockings were applied.  Ace wrap bandages were applied over the phlebectomy sites and at the top of the saphenofemoral junction. Blood loss was less than 15 cc.  The patient ambulated out of the operating room having tolerated the procedure well.  Uneventful ablation from distal thigh to just below saphenofemoral  junction.  Follow-up in 1 week

## 2017-05-19 ENCOUNTER — Encounter: Payer: Self-pay | Admitting: Vascular Surgery

## 2017-05-25 ENCOUNTER — Encounter: Payer: Self-pay | Admitting: Vascular Surgery

## 2017-05-25 ENCOUNTER — Ambulatory Visit (HOSPITAL_COMMUNITY)
Admission: RE | Admit: 2017-05-25 | Discharge: 2017-05-25 | Disposition: A | Discharge status: Home / Self Care | Payer: Medicare Other | Source: Ambulatory Visit | Attending: Vascular Surgery | Admitting: Vascular Surgery

## 2017-05-25 ENCOUNTER — Other Ambulatory Visit: Payer: Self-pay

## 2017-05-25 ENCOUNTER — Ambulatory Visit (INDEPENDENT_AMBULATORY_CARE_PROVIDER_SITE_OTHER): Payer: Medicare Other | Admitting: Vascular Surgery

## 2017-05-25 VITALS — BP 147/96 | HR 67 | Temp 97.5°F | Resp 16 | Ht 62.0 in | Wt 115.0 lb

## 2017-05-25 DIAGNOSIS — L97219 Non-pressure chronic ulcer of right calf with unspecified severity: Secondary | ICD-10-CM | POA: Diagnosis not present

## 2017-05-25 DIAGNOSIS — I87311 Chronic venous hypertension (idiopathic) with ulcer of right lower extremity: Secondary | ICD-10-CM | POA: Diagnosis not present

## 2017-05-25 DIAGNOSIS — I83019 Varicose veins of right lower extremity with ulcer of unspecified site: Secondary | ICD-10-CM

## 2017-05-25 DIAGNOSIS — I83891 Varicose veins of right lower extremities with other complications: Secondary | ICD-10-CM | POA: Diagnosis not present

## 2017-05-25 DIAGNOSIS — I87331 Chronic venous hypertension (idiopathic) with ulcer and inflammation of right lower extremity: Secondary | ICD-10-CM | POA: Diagnosis not present

## 2017-05-25 DIAGNOSIS — I82811 Embolism and thrombosis of superficial veins of right lower extremities: Secondary | ICD-10-CM | POA: Insufficient documentation

## 2017-05-25 DIAGNOSIS — L97812 Non-pressure chronic ulcer of other part of right lower leg with fat layer exposed: Secondary | ICD-10-CM | POA: Diagnosis not present

## 2017-05-25 DIAGNOSIS — L97919 Non-pressure chronic ulcer of unspecified part of right lower leg with unspecified severity: Secondary | ICD-10-CM

## 2017-05-25 NOTE — Progress Notes (Signed)
   Patient name: Jody [name] MRN: [id number] DOB: [birth year] Sex: female  REASON FOR VISIT: One week follow-up of laser ablation right great saphenous vein  HPI: Jody [name] is a 84 y.o. female here today for follow-up.  He had uneventful ablation of her right great saphenous vein last week.  She does have a typical amount of bruising associated with this.  She has compression wraps over her right leg venous ulcer and therefore was unable to wear compression garment but did keep these wrapped with an Ace wrap.  Current Outpatient Medications  Medication Sig Dispense Refill  . apixaban (ELIQUIS) 2.5 MG TABS tablet Take 1 tablet (2.5 mg total) by mouth 2 (two) times daily. 180 tablet 3  . diltiazem (CARTIA XT) 240 MG 24 hr capsule Take 240 mg by mouth daily.    . ferrous sulfate 325 (65 FE) MG tablet Take 325 mg by mouth.    . furosemide (LASIX) 20 MG tablet Take 1 tablet (20 mg total) by mouth daily as needed for edema. 30 tablet 3  . traMADol (ULTRAM) 50 MG tablet Take 1-2 tablets (50-100 mg total) by mouth every 12 (twelve) hours as needed. 60 tablet 5  . vitamin B-12 (CYANOCOBALAMIN) 1000 MCG tablet Take 1,000 mcg by mouth daily.    . cyproheptadine (PERIACTIN) 2 MG/5ML syrup Take 5 mLs (2 mg total) by mouth every 8 (eight) hours for 7 days, THEN 10 mLs (4 mg total) every 8 (eight) hours. 120 mL 2   No current facility-administered medications for this visit.      PHYSICAL EXAM: Vitals:   05/25/17 0937  BP: (!) 147/96  Pulse: 67  Resp: 16  Temp: (!) 97.5 F (36.4 C)  TempSrc: Oral  SpO2: 98%  Weight: 115 lb (52.2 kg)  Height: 5\' 2"  (1.575 m)    GENERAL: The patient is a well-nourished female, in no acute distress. The vital signs are documented above. On physical exam she has mild bruising and mild tenderness over her medial thigh  Her venous duplex today shows that she did not have closure of her great saphenous vein.  There is  minimal mural thrombus but the vein is patent. I image the vein myself with SonoSite ultrasound and this does show essentially no closure of her great saphenous vein.  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  I discussed this at length with the patient and her daughter present.  This is extremely rare and that I have seen this only on 1 or 2 occasions in the 12 years.  Unclear as to why this occurred.  Her vein diameters are 6-7 mm and so she does not have any evidence of large venous aneurysms.  She is on chronic Eliquis but had stopped this several days prior to procedure and resumed it following this.  She is on Eliquis for atrial fibrillation.  Unfortunately she continues to have severe venous ulceration surgery until these have healed.  I have recommended repeat ablation of her saphenous vein and we will schedule this at her earliest convenience.  She had been scheduled for a similar treatment to her left leg.  I have recommended that we defer this until we have attained closure of her right great saphenous vein.   Rosetta Posner, MD FACS [facility] Tel [phone] Pager [phone]

## 2017-06-02 ENCOUNTER — Encounter (HOSPITAL_BASED_OUTPATIENT_CLINIC_OR_DEPARTMENT_OTHER): Payer: Medicare Other | Attending: Internal Medicine

## 2017-06-02 DIAGNOSIS — L97819 Non-pressure chronic ulcer of other part of right lower leg with unspecified severity: Secondary | ICD-10-CM | POA: Diagnosis not present

## 2017-06-02 DIAGNOSIS — L97212 Non-pressure chronic ulcer of right calf with fat layer exposed: Secondary | ICD-10-CM | POA: Diagnosis not present

## 2017-06-02 DIAGNOSIS — I87311 Chronic venous hypertension (idiopathic) with ulcer of right lower extremity: Secondary | ICD-10-CM | POA: Diagnosis not present

## 2017-06-08 ENCOUNTER — Other Ambulatory Visit: Payer: Medicare Other | Admitting: Vascular Surgery

## 2017-06-08 DIAGNOSIS — I87311 Chronic venous hypertension (idiopathic) with ulcer of right lower extremity: Secondary | ICD-10-CM | POA: Diagnosis not present

## 2017-06-08 DIAGNOSIS — L97819 Non-pressure chronic ulcer of other part of right lower leg with unspecified severity: Secondary | ICD-10-CM | POA: Diagnosis not present

## 2017-06-08 DIAGNOSIS — L97319 Non-pressure chronic ulcer of right ankle with unspecified severity: Secondary | ICD-10-CM | POA: Diagnosis not present

## 2017-06-15 ENCOUNTER — Other Ambulatory Visit: Payer: Self-pay | Admitting: *Deleted

## 2017-06-15 ENCOUNTER — Ambulatory Visit: Payer: Medicare Other | Admitting: Vascular Surgery

## 2017-06-15 ENCOUNTER — Encounter (HOSPITAL_COMMUNITY): Payer: Medicare Other

## 2017-06-15 DIAGNOSIS — I83892 Varicose veins of left lower extremities with other complications: Secondary | ICD-10-CM

## 2017-06-15 DIAGNOSIS — I83013 Varicose veins of right lower extremity with ulcer of ankle: Secondary | ICD-10-CM

## 2017-06-15 DIAGNOSIS — L97319 Non-pressure chronic ulcer of right ankle with unspecified severity: Principal | ICD-10-CM

## 2017-06-23 ENCOUNTER — Encounter: Payer: Self-pay | Admitting: Internal Medicine

## 2017-06-23 ENCOUNTER — Ambulatory Visit: Payer: Medicare Other | Admitting: Internal Medicine

## 2017-06-23 VITALS — BP 140/80 | HR 89 | Ht 62.0 in | Wt 114.0 lb

## 2017-06-23 DIAGNOSIS — I480 Paroxysmal atrial fibrillation: Secondary | ICD-10-CM

## 2017-06-23 DIAGNOSIS — Z79899 Other long term (current) drug therapy: Secondary | ICD-10-CM

## 2017-06-23 DIAGNOSIS — I4891 Unspecified atrial fibrillation: Secondary | ICD-10-CM

## 2017-06-23 MED ORDER — FUROSEMIDE 20 MG PO TABS: 20.0000 mg | ORAL_TABLET | Freq: Every day | ORAL | 3 refills | Status: DC | PRN

## 2017-06-23 MED ORDER — DILTIAZEM HCL ER COATED BEADS 240 MG PO CP24: 240.0000 mg | ORAL_CAPSULE | Freq: Every day | ORAL | 3 refills | Status: DC

## 2017-06-23 MED ORDER — APIXABAN 2.5 MG PO TABS: 2.5000 mg | ORAL_TABLET | Freq: Two times a day (BID) | ORAL | 3 refills | Status: DC

## 2017-06-23 NOTE — Progress Notes (Signed)
[facility] Patient Care Team: Sharion Balloon, FNP as PCP - General (Family Medicine)   HPI  Jody [name] is a 84 y.o. female  is seen in followup for atrial fibrillation with a prior problem with rapid ventricular response. She is currently treated with diltiazem for rate control.  She takes  apixoban    She underwent In 2014 she had a nuclear scanning demonstrating normal perfusion and normal left ventricular function. An echocardiogram 2014 also demonstrated normal left ventricular function withmoderate LVH MR and PHTN. Mild aortic stenosis was identified.      The patient denies chest pain, shortness of breath, nocturnal dyspnea, orthopnea or peripheral edema.  There have been no palpitations, lightheadedness or syncope.   She had knee replacement last year and is pending hip replacement surgery  Functional status is stable, albeit limited by her arthritis. Denies chest pain shortness of breath or edema   She is able with effort to carry gorceries in from the truck    She says she is  losing weight but no weight changes by our chart   Past Medical History:  Diagnosis Date  . Afib ([place])   . Anemia   . CAD (coronary artery disease)   . Chronic diastolic heart failure ([place])   . HTN (hypertension)   . Mitral regurgitation    Moderate, echo, 07/2012  . Pulmonary hypertension ([place])     Past Surgical History:  Procedure Laterality Date  . JOINT REPLACEMENT Right Feb 2015   Dr. Case- Ledell Noss  . TOTAL KNEE ARTHROPLASTY Left     Current Outpatient Medications  Medication Sig Dispense Refill  . apixaban (ELIQUIS) 2.5 MG TABS tablet Take 1 tablet (2.5 mg total) by mouth 2 (two) times daily. 180 tablet 3  . diltiazem (CARTIA XT) 240 MG 24 hr capsule Take 1 capsule (240 mg total) by mouth daily. 90 capsule 3  . ferrous sulfate 325 (65 FE) MG tablet Take 325 mg by mouth.    . furosemide (LASIX) 20 MG tablet Take 1 tablet (20 mg total) by mouth daily as needed for edema. 30 tablet 3  .  traMADol (ULTRAM) 50 MG tablet Take 1-2 tablets (50-100 mg total) by mouth every 12 (twelve) hours as needed. 60 tablet 5  . vitamin B-12 (CYANOCOBALAMIN) 1000 MCG tablet Take 1,000 mcg by mouth daily.    . cyproheptadine (PERIACTIN) 2 MG/5ML syrup Take 5 mLs (2 mg total) by mouth every 8 (eight) hours for 7 days, THEN 10 mLs (4 mg total) every 8 (eight) hours. 120 mL 2   No current facility-administered medications for this visit.     Allergies  Allergen Reactions  . Levaquin [Levofloxacin] Other (See Comments)    Patient stated vision became blurry and became weak.    Review of Systems negative except from HPI and PMH  Physical Exam BP 140/80   Pulse 89   Ht 5\' 2"  (1.575 m)   Wt 114 lb (51.7 kg)   SpO2 97%   BMI 20.85 kg/m  Well developed and nourished in no acute distress HENT normal Neck supple with JVP-flat Clear Regular rate and rhythm, no murmurs or gallops Abd-soft with active BS No Clubbing cyanosis edema Skin-warm and dry A & Oriented  Grossly normal sensory and motor function   ECG  Atrial fib -/08/42 LBBB  Assessment and  Plan  Atrial fibrillation-permanent  Hypertension   LBBB  Preoperative evaluation   Functional status is stable and think her risks are prob acceptable  for surgery  Will get  arrange to get  an ultrasound of her heart for preop screening  On Anticoagulation;  No bleeding issues   Will check surveillance  labs today  We spent more than 50% of our >25 min visit in face to face counseling regarding the above

## 2017-06-23 NOTE — Patient Instructions (Addendum)
Medication Instructions:  Your physician recommends that you continue on your current medications as directed. Please refer to the Current Medication list given to you today.  Labwork: You will have a CBC and BMP drawn today  Testing/Procedures: Your physician has requested that you have an echocardiogram. Echocardiography is a painless test that uses sound waves to create images of your heart. It provides your doctor with information about the size and shape of your heart and how well your heart's chambers and valves are working. This procedure takes approximately one hour. There are no restrictions for this procedure.  Please schedule for [facility] location  Follow-Up: Your physician recommends that you schedule a follow-up appointment in:    One year with Dr Caryl Comes.   Any Other Special Instructions Will Be Listed Below (If Applicable).     If you need a refill on your cardiac medications before your next appointment, please call your pharmacy.

## 2017-06-24 LAB — CBC WITH DIFFERENTIAL/PLATELET
Basophils Absolute: 0 10*3/uL (ref 0.0–0.2)
Basos: 0 %
EOS (ABSOLUTE): 0.2 10*3/uL (ref 0.0–0.4)
EOS: 4 %
HEMATOCRIT: 37.6 % (ref 34.0–46.6)
HEMOGLOBIN: 12.6 g/dL (ref 11.1–15.9)
IMMATURE GRANULOCYTES: 0 %
Immature Grans (Abs): 0 10*3/uL (ref 0.0–0.1)
Lymphocytes Absolute: 1.4 10*3/uL (ref 0.7–3.1)
Lymphs: 32 %
MCH: 30.3 pg (ref 26.6–33.0)
MCHC: 33.5 g/dL (ref 31.5–35.7)
MCV: 90 fL (ref 79–97)
MONOCYTES: 9 %
Monocytes Absolute: 0.4 10*3/uL (ref 0.1–0.9)
NEUTROS PCT: 55 %
Neutrophils Absolute: 2.5 10*3/uL (ref 1.4–7.0)
Platelets: 248 10*3/uL (ref 150–379)
RBC: 4.16 x10E6/uL (ref 3.77–5.28)
RDW: 14.4 % (ref 12.3–15.4)
WBC: 4.5 10*3/uL (ref 3.4–10.8)

## 2017-06-24 LAB — BASIC METABOLIC PANEL
BUN / CREAT RATIO: 15 (ref 12–28)
BUN: 18 mg/dL (ref 8–27)
CO2: 25 mmol/L (ref 20–29)
CREATININE: 1.24 mg/dL — AB (ref 0.57–1.00)
Calcium: 9 mg/dL (ref 8.7–10.3)
Chloride: 103 mmol/L (ref 96–106)
GFR calc Af Amer: 45 mL/min/{1.73_m2} — ABNORMAL LOW (ref >59)
GFR calc non Af Amer: 39 mL/min/{1.73_m2} — ABNORMAL LOW (ref >59)
GLUCOSE: 91 mg/dL (ref 65–99)
Potassium: 4.4 mmol/L (ref 3.5–5.2)
Sodium: 142 mmol/L (ref 134–144)

## 2017-06-26 ENCOUNTER — Ambulatory Visit (INDEPENDENT_AMBULATORY_CARE_PROVIDER_SITE_OTHER): Payer: Medicare Other | Admitting: Family

## 2017-06-26 ENCOUNTER — Encounter: Payer: Self-pay | Admitting: Family

## 2017-06-26 VITALS — BP 138/69 | HR 79 | Temp 98.1°F | Ht 62.0 in

## 2017-06-26 DIAGNOSIS — M1612 Unilateral primary osteoarthritis, left hip: Secondary | ICD-10-CM

## 2017-06-26 NOTE — Patient Instructions (Signed)

## 2017-06-26 NOTE — Progress Notes (Signed)
   Subjective:    Patient ID: Jody [name], female    DOB: [birth year], 84 y.o.   MRN: [id number]  Arthritis  Presents for follow-up visit. She complains of pain and stiffness. The symptoms have been worsening. Affected locations include the left hip. Her pain is at a severity of 10/10.      Review of Systems  Musculoskeletal: Positive for arthralgias, arthritis and stiffness.  All other systems reviewed and are negative.      Objective:   Physical Exam  Constitutional: She is oriented to person, place, and time. She appears well-developed and well-nourished. No distress.  HENT:  Head: Normocephalic.  Eyes: Pupils are equal, round, and reactive to light.  Neck: Normal range of motion. Neck supple. No thyromegaly present.  Cardiovascular: Normal rate, regular rhythm and intact distal pulses.  Murmur heard. Pulmonary/Chest: Effort normal and breath sounds normal. No respiratory distress. She has no wheezes.  Abdominal: Soft. Bowel sounds are normal. She exhibits no distension. There is no tenderness.  Musculoskeletal: She exhibits tenderness (pain in left hip with internal and external rotation ). She exhibits no edema.  Neurological: She is alert and oriented to person, place, and time.  Skin: Skin is warm and dry.  Psychiatric: She has a normal mood and affect. Her behavior is normal. Judgment and thought content normal.  Vitals reviewed.     BP 138/69   Pulse 79   Temp 98.1 F (36.7 C) (Oral)   Ht 5\' 2"  (1.575 m)   BMI 20.85 kg/m      Assessment & Plan:  1. Primary osteoarthritis of left hip Take Ultram as needed ROM execises as needed  RTO and keep chronic follow up - Ambulatory referral to [place], FNP

## 2017-06-29 ENCOUNTER — Encounter: Payer: Self-pay | Admitting: Vascular Surgery

## 2017-06-29 ENCOUNTER — Ambulatory Visit: Payer: Medicare Other | Admitting: Vascular Surgery

## 2017-06-29 VITALS — BP 162/90 | HR 86 | Temp 97.8°F | Resp 16 | Ht 62.0 in | Wt 115.0 lb

## 2017-06-29 DIAGNOSIS — I83892 Varicose veins of left lower extremities with other complications: Secondary | ICD-10-CM | POA: Diagnosis not present

## 2017-06-29 NOTE — Progress Notes (Signed)
     Laser Ablation Procedure    Date: 06/29/2017   Jody [name] DOB:1928/02/21  Consent signed: Yes    Surgeon:  Dr. Sherren Mocha Early  Procedure: Laser Ablation: right Greater Saphenous Vein  BP (!) 162/90   Pulse 86   Temp 97.8 F (36.6 C)   Resp 16   Ht 5\' 2"  (1.575 m)   Wt 115 lb (52.2 kg)   SpO2 97%   BMI 21.03 kg/m   Tumescent Anesthesia: 320 cc 0.9% NaCl with 50 cc Lidocaine HCL with 1% Epi and 15 cc 8.4% NaHCO3  Local Anesthesia: 6 cc Lidocaine HCL and NaHCO3 (ratio 2:1)  15 watts continuous mode        Total energy: 1504   Total time: 1:40    Patient tolerated procedure well  Notes:   Description of Procedure:  After marking the course of the secondary varicosities, the patient was placed on the operating table in the supine position, and the right leg was prepped and draped in sterile fashion.   Local anesthetic was administered and under ultrasound guidance the saphenous vein was accessed with a micro needle and guide wire; then the mirco puncture sheath was placed.  A guide wire was inserted saphenofemoral junction , followed by a 5 french sheath.  The position of the sheath and then the laser fiber below the junction was confirmed using the ultrasound.  Tumescent anesthesia was administered along the course of the saphenous vein using ultrasound guidance. The patient was placed in Trendelenburg position and protective laser glasses were placed on patient and staff, and the laser was fired at 15 watts continuous mode advancing 1-36mm/second for a total of 1504 joules.     Steri strips were applied to the stab wounds and ABD pads and thigh high compression stockings were applied.  Ace wrap bandages were applied over the phlebectomy sites and at the top of the saphenofemoral junction. Blood loss was less than 15 cc.  The patient ambulated out of the operating room having tolerated the procedure well.  Uneventful ablation from distal thigh to just below her  saphenofemoral junction.  She had had treatment of this same vein in February with a very unusual outcome of non-closure.  We will see her again in 1 week for duplex

## 2017-06-30 ENCOUNTER — Encounter: Payer: Self-pay | Admitting: Vascular Surgery

## 2017-06-30 DIAGNOSIS — M25562 Pain in left knee: Secondary | ICD-10-CM | POA: Diagnosis not present

## 2017-06-30 DIAGNOSIS — S8002XA Contusion of left knee, initial encounter: Secondary | ICD-10-CM | POA: Diagnosis not present

## 2017-07-06 ENCOUNTER — Other Ambulatory Visit: Payer: Self-pay

## 2017-07-06 ENCOUNTER — Ambulatory Visit (INDEPENDENT_AMBULATORY_CARE_PROVIDER_SITE_OTHER): Payer: Medicare Other | Admitting: Vascular Surgery

## 2017-07-06 ENCOUNTER — Encounter: Payer: Self-pay | Admitting: Vascular Surgery

## 2017-07-06 ENCOUNTER — Ambulatory Visit (HOSPITAL_COMMUNITY)
Admission: RE | Admit: 2017-07-06 | Discharge: 2017-07-06 | Disposition: A | Discharge status: Home / Self Care | Payer: Medicare Other | Source: Ambulatory Visit | Attending: Vascular Surgery | Admitting: Vascular Surgery

## 2017-07-06 VITALS — BP 150/85 | HR 84 | Temp 97.0°F | Resp 18 | Ht 62.0 in | Wt 115.0 lb

## 2017-07-06 DIAGNOSIS — I83013 Varicose veins of right lower extremity with ulcer of ankle: Secondary | ICD-10-CM | POA: Diagnosis not present

## 2017-07-06 DIAGNOSIS — L97319 Non-pressure chronic ulcer of right ankle with unspecified severity: Secondary | ICD-10-CM

## 2017-07-06 DIAGNOSIS — I83892 Varicose veins of left lower extremities with other complications: Secondary | ICD-10-CM | POA: Diagnosis not present

## 2017-07-06 NOTE — Progress Notes (Signed)
[facility]  Patient name: Jody [name] MRN: [id number] DOB: 09/19/1927 Sex: female  REASON FOR VISIT: One week follow-up laser ablation right great saphenous vein  HPI: Jody [name] is a 84 y.o. female here for follow-up.  She had minimal discomfort associated with her ablation and has been compliant with her compression.  Bruising.  Past Medical History:  Diagnosis Date  . Afib ([place])   . Anemia   . CAD (coronary artery disease)   . Chronic diastolic heart failure ([place])   . HTN (hypertension)   . Mitral regurgitation    Moderate, echo, 07/2012  . Pulmonary hypertension (H[facility])     Family History  Problem Relation Age of Onset  . Diabetes Mother     SOCIAL HISTORY: Social History   Tobacco Use  . Smoking status: Never Smoker  . Smokeless tobacco: Never Used  Substance Use Topics  . Alcohol use: No    Allergies  Allergen Reactions  . Levaquin [Levofloxacin] Other (See Comments)    Patient stated vision became blurry and became weak.    Current Outpatient Medications  Medication Sig Dispense Refill  . apixaban (ELIQUIS) 2.5 MG TABS tablet Take 1 tablet (2.5 mg total) by mouth 2 (two) times daily. 180 tablet 3  . diltiazem (CARTIA XT) 240 MG 24 hr capsule Take 1 capsule (240 mg total) by mouth daily. 90 capsule 3  . ferrous sulfate 325 (65 FE) MG tablet Take 325 mg by mouth.    . furosemide (LASIX) 20 MG tablet Take 1 tablet (20 mg total) by mouth daily as needed for edema. 30 tablet 3  . traMADol (ULTRAM) 50 MG tablet Take 1-2 tablets (50-100 mg total) by mouth every 12 (twelve) hours as needed. 60 tablet 5  . vitamin B-12 (CYANOCOBALAMIN) 1000 MCG tablet Take 1,000 mcg by mouth daily.    . cyproheptadine (PERIACTIN) 2 MG/5ML syrup Take 5 mLs (2 mg total) by mouth every 8 (eight) hours for 7 days, THEN 10 mLs (4 mg total) every 8 (eight) hours. 120 mL 2   No current facility-administered  medications for this visit.     REVIEW OF SYSTEMS:  [X]  denotes positive finding, [ ]  denotes negative finding Cardiac  Comments:  Chest pain or chest pressure:    Shortness of breath upon exertion:    Short of breath when lying flat:    Irregular heart rhythm:        Vascular    Pain in calf, thigh, or hip brought on by ambulation:    Pain in feet at night that wakes you up from your sleep:     Blood clot in your veins:    Leg swelling:  x         PHYSICAL EXAM: Vitals:   07/06/17 0936  BP: (!) 150/85  Pulse: 84  Resp: 18  Temp: (!) 97 F (36.1 C)  TempSrc: Oral  SpO2: 98%  Weight: 115 lb (52.2 kg)  Height: 5\' 2"  (1.575 m)    GENERAL: The patient is a well-nourished female, in no acute distress. The vital signs are documented above. CARDIOVASCULAR: Palpable right dorsalis pedis pulse.  Moderate bruising to her medial thigh. PULMONARY: There is good air exchange  MUSCULOSKELETAL: There are no major deformities or cyanosis. NEUROLOGIC: No focal weakness or paresthesias are detected. SKIN: There are no ulcers or rashes noted. PSYCHIATRIC: The patient has a normal affect.  DATA:  Surgery of her great saphenous vein  with no evidence of DVT  MEDICAL ISSUES: Table overall.  Have the uncommon event of non-closure of her great saphenous vein with her initial ablation.  Fortunately has had successful occlusion today.  She will return to [place] in 2 weeks for left leg ablation    Rosetta Posner, MD [facility] Tel [phone] Pager [phone]

## 2017-07-12 ENCOUNTER — Ambulatory Visit (INDEPENDENT_AMBULATORY_CARE_PROVIDER_SITE_OTHER): Payer: Medicare Other | Admitting: Orthopaedic Surgery

## 2017-07-12 ENCOUNTER — Encounter (INDEPENDENT_AMBULATORY_CARE_PROVIDER_SITE_OTHER): Payer: Self-pay | Admitting: Orthopaedic Surgery

## 2017-07-12 DIAGNOSIS — M25552 Pain in left hip: Secondary | ICD-10-CM

## 2017-07-12 DIAGNOSIS — M1612 Unilateral primary osteoarthritis, left hip: Secondary | ICD-10-CM

## 2017-07-12 NOTE — Progress Notes (Signed)
Office Visit Note   Patient: Jody [name]           Date of Birth: 07-Apr-1927           MRN: [id number] Visit Date: 07/12/2017              Requested by: Sharion Balloon, [address] [place], [place] [address] PCP: Sharion Balloon, FNP   Assessment & Plan: Visit Diagnoses:  1. Pain of left hip joint   2. Unilateral primary osteoarthritis, left hip     Plan: I do feel that she would benefit from a left total hip arthroplasty through direct anterior approach.  I spent a long period time with her and her family talking about the risk and benefits of surgery.  I showed her her x-rays and went over hip model explained in detail what the surgery involves.  This is something she is hoping to proceed with the near future.  She understands that she would need to stop Eliquis 4 days prior to surgery.  All questions and concerns were answered and addressed.  We will work on getting this scheduled.  Follow-Up Instructions: Return for 2 weeks post-op.   Orders:  No orders of the defined types were placed in this encounter.  No orders of the defined types were placed in this encounter.     Procedures: No procedures performed   Clinical Data: No additional findings.   Subjective: Chief Complaint  Patient presents with  . Left Hip - Pain  Patient is a very pleasant 84 year old female who comes with family today for evaluation treatment of severe arthritis of her left hip.  She has already been seen by other orthopedic office with [facility].  They have done knee replacements on her in the past and she is done wonderfully with these.  Her right hip pain is severe and is daily.  She had x-rays last year confirming severe end-stage arthritis of her right hip.  She is a very thin individual and does ambulate using a cane.  She is worked on activity modification.  She cannot take anti-inflammatories due to being on the blood thinner Eliquis.  At this point her pain is  daily and her left hip it is detrimentally affecting her active daily living, her quality of life, and her mobility.  At this point she does wish to proceed with hip replacement surgery.  She is seen regularly by her cardiologist as well.  HPI  Review of Systems She currently denies any headache, chest pain, shortness of breath, fever, chills, nausea, vomiting.  Objective: Vital Signs: There were no vitals taken for this visit.  Physical Exam She is alert and oriented x3 and in no acute distress.  She is a very petite individual. Ortho Exam Examination of her left hip shows significant pain with any attempts of internal or external rotation as well as flexion extension.  Her right hip exam is entirely normal. Specialty Comments:  No specialty comments available.  Imaging: No results found. X-rays independently reviewed of her left hip shows severe end-stage arthritis.  There is complete loss of joint space.  There is cystic changes in the femoral head and in the acetabulum as well as sclerotic changes and para-articular osteophytes.  Her right hip actually appears normal.  PMFS History: Patient Active Problem List   Diagnosis Date Noted  . Pain of left hip joint 07/12/2017  . Unilateral primary osteoarthritis, left hip 07/12/2017  . PVD (peripheral vascular  disease) ([place]) 07/05/2016  . Venous stasis ulcer ([place]) 03/15/2016  . Cellulitis 12/08/2015  . (HFpEF) heart failure with preserved ejection fraction ([place]) 04/16/2013  . Mitral regurgitation   . Anemia   . HTN (hypertension)   . Atrial fibrillation ([place]) 04/02/2009  . DIASTOLIC HEART FAILURE, CHRONIC 04/02/2009   Past Medical History:  Diagnosis Date  . Afib ([place])   . Anemia   . CAD (coronary artery disease)   . Chronic diastolic heart failure ([place])   . HTN (hypertension)   . Mitral regurgitation    Moderate, echo, 07/2012  . Pulmonary hypertension ([facility])     Family History  Problem Relation Age of Onset  . Diabetes  Mother     Past Surgical History:  Procedure Laterality Date  . JOINT REPLACEMENT Right Feb 2015   Dr. Case- Ledell Noss  . TOTAL KNEE ARTHROPLASTY Left    Social History   Occupational History  . Not on file  Tobacco Use  . Smoking status: Never Smoker  . Smokeless tobacco: Never Used  Substance and Sexual Activity  . Alcohol use: No  . Drug use: No  . Sexual activity: Not on file

## 2017-07-13 ENCOUNTER — Encounter: Payer: Self-pay | Admitting: Vascular Surgery

## 2017-07-13 ENCOUNTER — Ambulatory Visit: Payer: Medicare Other | Admitting: Vascular Surgery

## 2017-07-13 VITALS — BP 147/81 | HR 78 | Temp 97.8°F | Resp 16 | Ht 62.0 in | Wt 115.0 lb

## 2017-07-13 DIAGNOSIS — I83892 Varicose veins of left lower extremities with other complications: Secondary | ICD-10-CM

## 2017-07-13 NOTE — Progress Notes (Signed)
     Laser Ablation Procedure    Date: 07/13/2017   Jody [name] DOB:[birth year]  Consent signed: Yes    Surgeon:  Dr. Sherren Mocha Yazmine Sorey  Procedure: Laser Ablation: left Greater Saphenous Vein  BP (!) 147/81   Pulse 78   Temp 97.8 F (36.6 C)   Resp 16   Ht 5\' 2"  (1.575 m)   Wt 115 lb (52.2 kg)   SpO2 98%   BMI 21.03 kg/m   Tumescent Anesthesia: 325 cc 0.9% NaCl with 50 cc Lidocaine HCL with 1% Epi and 15 cc 8.4% NaHCO3  Local Anesthesia: 3 cc Lidocaine HCL and NaHCO3 (ratio 2:1)  15 watts continuous mode        Total energy: 1990   Total time: 2:11    Patient tolerated procedure well  Notes:   Description of Procedure:  After marking the course of the secondary varicosities, the patient was placed on the operating table in the supine position, and the left leg was prepped and draped in sterile fashion.   Local anesthetic was administered and under ultrasound guidance the saphenous vein was accessed with a micro needle and guide wire; then the mirco puncture sheath was placed.  A guide wire was inserted saphenofemoral junction , followed by a 5 french sheath.  The position of the sheath and then the laser fiber below the junction was confirmed using the ultrasound.  Tumescent anesthesia was administered along the course of the saphenous vein using ultrasound guidance. The patient was placed in Trendelenburg position and protective laser glasses were placed on patient and staff, and the laser was fired at 15 watts continuous mode advancing 1-4mm/second for a total of 1990 joules.     Steri strips were applied to the stab wounds and ABD pads and thigh high compression stockings were applied.  Ace wrap bandages were applied over the phlebectomy sites and at the top of the saphenofemoral junction. Blood loss was less than 15 cc.  The patient ambulated out of the operating room having tolerated the procedure well.  Uneventful ablation from mid calf to proximal thigh.  She had a large  collateral branch feeling her dilated saphenous vein in the proximal thigh and occlusion of her saphenous vein proximal to this.  Will be seen in 2 weeks for duplex follow-up

## 2017-07-19 ENCOUNTER — Other Ambulatory Visit (INDEPENDENT_AMBULATORY_CARE_PROVIDER_SITE_OTHER): Payer: Self-pay | Admitting: Physician Assistant

## 2017-07-19 ENCOUNTER — Ambulatory Visit (INDEPENDENT_AMBULATORY_CARE_PROVIDER_SITE_OTHER): Payer: Medicare Other

## 2017-07-19 ENCOUNTER — Other Ambulatory Visit: Payer: Self-pay

## 2017-07-19 DIAGNOSIS — Z79899 Other long term (current) drug therapy: Secondary | ICD-10-CM

## 2017-07-19 DIAGNOSIS — I4891 Unspecified atrial fibrillation: Secondary | ICD-10-CM

## 2017-07-19 DIAGNOSIS — I480 Paroxysmal atrial fibrillation: Secondary | ICD-10-CM | POA: Diagnosis not present

## 2017-07-20 ENCOUNTER — Telehealth: Payer: Self-pay | Admitting: Family

## 2017-07-20 DIAGNOSIS — M150 Primary generalized (osteo)arthritis: Principal | ICD-10-CM

## 2017-07-20 DIAGNOSIS — I83001 Varicose veins of unspecified lower extremity with ulcer of thigh: Secondary | ICD-10-CM

## 2017-07-20 DIAGNOSIS — L97101 Non-pressure chronic ulcer of unspecified thigh limited to breakdown of skin: Secondary | ICD-10-CM

## 2017-07-20 DIAGNOSIS — M159 Polyosteoarthritis, unspecified: Secondary | ICD-10-CM

## 2017-07-20 MED ORDER — FERROUS SULFATE 325 (65 FE) MG PO TABS: 325.0000 mg | ORAL_TABLET | Freq: Every day | ORAL | 1 refills | Status: AC

## 2017-07-20 MED ORDER — TRAMADOL HCL 50 MG PO TABS: 50.0000 mg | ORAL_TABLET | Freq: Two times a day (BID) | ORAL | 5 refills | Status: DC | PRN

## 2017-07-20 MED ORDER — APIXABAN 2.5 MG PO TABS: 2.5000 mg | ORAL_TABLET | Freq: Two times a day (BID) | ORAL | 3 refills | Status: DC

## 2017-07-20 MED ORDER — DILTIAZEM HCL ER COATED BEADS 240 MG PO CP24: 240.0000 mg | ORAL_CAPSULE | Freq: Every day | ORAL | 3 refills | Status: AC

## 2017-07-20 MED ORDER — FUROSEMIDE 20 MG PO TABS: 20.0000 mg | ORAL_TABLET | Freq: Every day | ORAL | 3 refills | Status: AC | PRN

## 2017-07-20 NOTE — Telephone Encounter (Signed)
Please advise on refills.  

## 2017-07-20 NOTE — Telephone Encounter (Signed)
What is the name of the medication? diltiazem (CARTIA XT) 240 MG 24 hr capsule Tramadol eliquis furosemide (LASIX) 20 MG tablet ferrous sulfate 325 (65 FE) MG tablet  Have you contacted your pharmacy to request a refill? No more refill  Which pharmacy would you like this sent to? [facility]   Patient notified that their request is being sent to the clinical staff for review and that they should receive a call once it is complete. If they do not receive a call within 24 hours they can check with their pharmacy or our office.

## 2017-07-20 NOTE — Telephone Encounter (Signed)
Prescriptions sent to pharmacy

## 2017-07-21 NOTE — Pre-Procedure Instructions (Signed)
Rekha R Yusupov  07/21/2017      [facility] [address], [address] [address] [place] [address] Phone: [phone] Fax: [phone]    Your procedure is scheduled on Tuesday April 30.  Report to [facility] Admitting at 12:30 A.M.  Call this number if you have problems the morning of surgery:  [phone]   Remember:  Do not eat food or drink liquids after midnight.  Take these medicines the morning of surgery with A SIP OF WATER:   Cartia (Diltiazem) Acetaminophen (tylenol) if needed Tramadol (ultram) if needed  7 days prior to surgery STOP taking any Aspirin(unless otherwise instructed by your surgeon), Aleve, Naproxen, Ibuprofen, Motrin, Advil, Goody's, BC's, all herbal medications, fish oil, and all vitamins  **FOLLOW your surgeon's instructions on stopping Eliquis (apixaban). If no instructions were given, please call your surgeon's office**   Do not wear jewelry, make-up or nail polish.  Do not wear lotions, powders, or perfumes, or deodorant.  Do not shave 48 hours prior to surgery.  Men may shave face and neck.  Do not bring valuables to the hospital.  [facility] is not responsible for any belongings or valuables.  Contacts, dentures or bridgework may not be worn into surgery.  Leave your suitcase in the car.  After surgery it may be brought to your room.  For patients admitted to the hospital, discharge time will be determined by your treatment team.  Patients discharged the day of surgery will not be allowed to drive home.   Special instructions:    [place]- Preparing For Surgery  Before surgery, you can play an important role. Because skin is not sterile, your skin needs to be as free of germs as possible. You can reduce the number of germs on your skin by washing with CHG (chlorahexidine gluconate) Soap before surgery.  CHG is an antiseptic cleaner which kills germs and bonds with the skin to continue  killing germs even after washing.  Please do not use if you have an allergy to CHG or antibacterial soaps. If your skin becomes reddened/irritated stop using the CHG.  Do not shave (including legs and underarms) for at least 48 hours prior to first CHG shower. It is OK to shave your face.  Please follow these instructions carefully.   1. Shower the NIGHT BEFORE SURGERY and the MORNING OF SURGERY with CHG.   2. If you chose to wash your hair, wash your hair first as usual with your normal shampoo.  3. After you shampoo, rinse your hair and body thoroughly to remove the shampoo.  4. Use CHG as you would any other liquid soap. You can apply CHG directly to the skin and wash gently with a scrungie or a clean washcloth.   5. Apply the CHG Soap to your body ONLY FROM THE NECK DOWN.  Do not use on open wounds or open sores. Avoid contact with your eyes, ears, mouth and genitals (private parts). Wash Face and genitals (private parts)  with your normal soap.  6. Wash thoroughly, paying special attention to the area where your surgery will be performed.  7. Thoroughly rinse your body with warm water from the neck down.  8. DO NOT shower/wash with your normal soap after using and rinsing off the CHG Soap.  9. Pat yourself dry with a CLEAN TOWEL.  10. Wear CLEAN PAJAMAS to bed the night before surgery, wear comfortable clothes the morning  of surgery  11. Place CLEAN SHEETS on your bed the night of your first shower and DO NOT SLEEP WITH PETS.    Day of Surgery: Do not apply any deodorants/lotions. Please wear clean clothes to the hospital/surgery center.      Please read over the following fact sheets that you were given. Coughing and Deep Breathing, Total Joint Packet, MRSA Information and Surgical Site Infection Prevention

## 2017-07-24 ENCOUNTER — Encounter (HOSPITAL_COMMUNITY)
Admission: RE | Admit: 2017-07-24 | Discharge: 2017-07-24 | Disposition: A | Discharge status: Home / Self Care | Payer: Medicare Other | Source: Ambulatory Visit | Attending: Orthopaedic Surgery | Admitting: Orthopaedic Surgery

## 2017-07-24 ENCOUNTER — Encounter (HOSPITAL_COMMUNITY): Payer: Self-pay

## 2017-07-24 DIAGNOSIS — I080 Rheumatic disorders of both mitral and aortic valves: Secondary | ICD-10-CM | POA: Diagnosis not present

## 2017-07-24 DIAGNOSIS — I2510 Atherosclerotic heart disease of native coronary artery without angina pectoris: Secondary | ICD-10-CM | POA: Diagnosis not present

## 2017-07-24 DIAGNOSIS — Z96652 Presence of left artificial knee joint: Secondary | ICD-10-CM | POA: Diagnosis not present

## 2017-07-24 DIAGNOSIS — Z7901 Long term (current) use of anticoagulants: Secondary | ICD-10-CM | POA: Diagnosis not present

## 2017-07-24 DIAGNOSIS — I272 Pulmonary hypertension, unspecified: Secondary | ICD-10-CM | POA: Diagnosis not present

## 2017-07-24 DIAGNOSIS — D649 Anemia, unspecified: Secondary | ICD-10-CM | POA: Diagnosis not present

## 2017-07-24 DIAGNOSIS — I5032 Chronic diastolic (congestive) heart failure: Secondary | ICD-10-CM | POA: Diagnosis not present

## 2017-07-24 DIAGNOSIS — Z01818 Encounter for other preprocedural examination: Secondary | ICD-10-CM | POA: Diagnosis not present

## 2017-07-24 DIAGNOSIS — Z01812 Encounter for preprocedural laboratory examination: Secondary | ICD-10-CM | POA: Diagnosis not present

## 2017-07-24 DIAGNOSIS — I447 Left bundle-branch block, unspecified: Secondary | ICD-10-CM | POA: Diagnosis not present

## 2017-07-24 DIAGNOSIS — I110 Hypertensive heart disease with heart failure: Secondary | ICD-10-CM | POA: Diagnosis not present

## 2017-07-24 DIAGNOSIS — I4891 Unspecified atrial fibrillation: Secondary | ICD-10-CM | POA: Diagnosis not present

## 2017-07-24 DIAGNOSIS — M1612 Unilateral primary osteoarthritis, left hip: Secondary | ICD-10-CM | POA: Diagnosis not present

## 2017-07-24 DIAGNOSIS — Z79899 Other long term (current) drug therapy: Secondary | ICD-10-CM | POA: Diagnosis not present

## 2017-07-24 HISTORY — DX: Left bundle-branch block, unspecified: I44.7

## 2017-07-24 HISTORY — DX: Dyspnea, unspecified: R06.00

## 2017-07-24 HISTORY — DX: Nonrheumatic aortic (valve) stenosis: I35.0

## 2017-07-24 HISTORY — DX: Unspecified osteoarthritis, unspecified site: M19.90

## 2017-07-24 LAB — CBC
HCT: 38.9 % (ref 36.0–46.0)
Hemoglobin: 12.5 g/dL (ref 12.0–15.0)
MCH: 30.9 pg (ref 26.0–34.0)
MCHC: 32.1 g/dL (ref 30.0–36.0)
MCV: 96 fL (ref 78.0–100.0)
Platelets: 272 10*3/uL (ref 150–400)
RBC: 4.05 MIL/uL (ref 3.87–5.11)
RDW: 14.6 % (ref 11.5–15.5)
WBC: 5.3 10*3/uL (ref 4.0–10.5)

## 2017-07-24 LAB — SURGICAL PCR SCREEN
MRSA, PCR: NEGATIVE
Staphylococcus aureus: NEGATIVE

## 2017-07-24 LAB — BASIC METABOLIC PANEL
Anion gap: 10 (ref 5–15)
BUN: 18 mg/dL (ref 6–20)
CHLORIDE: 105 mmol/L (ref 101–111)
CO2: 26 mmol/L (ref 22–32)
Calcium: 9.4 mg/dL (ref 8.9–10.3)
Creatinine, Ser: 1 mg/dL (ref 0.44–1.00)
GFR calc Af Amer: 57 mL/min — ABNORMAL LOW (ref >60)
GFR calc non Af Amer: 49 mL/min — ABNORMAL LOW (ref >60)
GLUCOSE: 90 mg/dL (ref 65–99)
Potassium: 4.6 mmol/L (ref 3.5–5.1)
Sodium: 141 mmol/L (ref 135–145)

## 2017-07-25 ENCOUNTER — Encounter (HOSPITAL_COMMUNITY): Payer: Self-pay

## 2017-07-25 ENCOUNTER — Telehealth: Payer: Self-pay | Admitting: Internal Medicine

## 2017-07-25 NOTE — Telephone Encounter (Signed)
New Message:      Pt's daughter is calling for test results.

## 2017-07-25 NOTE — Progress Notes (Signed)
Anesthesia Chart Review:   Case:  [id number] Date/Time:  08/01/17 1345   Procedure:  LEFT TOTAL HIP ARTHROPLASTY ANTERIOR APPROACH (Left )   Anesthesia type:  Choice   Pre-op diagnosis:  osteoarthritis left hip   Location:  [facility] OR ROOM 07 / [facility]   Surgeon:  Mcarthur Rossetti, MD      DISCUSSION: Pt is an 84 year old female with a hx of permanent atrial fibrillation (rate controlled with diltiazem). Moderate aortic stenosis by 07/19/17 echo. Has cardiac clearance for surgery. Will stop eliquis 07/28/17.     VS: BP 140/66   Pulse 84   Temp 36.5 C   Resp 18   Ht 5\' 2"  (1.575 m)   Wt 113 lb 8 oz (51.5 kg)   SpO2 95%   BMI 20.76 kg/m   PROVIDERS: Sharion Balloon, FNP who referred pt to Dr.  Ninfa Linden EP cardiologist is Virl Axe, MD. Pt cleared for surgery at last office visit 06/23/17   LABS: Labs reviewed: Acceptable for surgery. PT/INR will be obtained day of surgery.   (all labs ordered are listed, but only abnormal results are displayed)  Labs Reviewed  BASIC METABOLIC PANEL - Abnormal; Notable for the following components:      Result Value   GFR calc non Af Amer 49 (*)    GFR calc Af Amer 57 (*)    All other components within normal limits  SURGICAL PCR SCREEN  CBC     EKG 06/23/17: atrial fibrillation (81 bpm). LAD. LBBB.   CV:  Echo 07/19/17:  - Left ventricle: The cavity size was normal. Wall thickness was increased in a pattern of mild LVH. Systolic function was normal. The estimated ejection fraction was in the range of 55% to 60%. Septal motion consistent with left bundle branch block. Wall motion was normal; there were no regional wall motion abnormalities. The study was not technically sufficient to allow evaluation of LV diastolic dysfunction due to atrial fibrillation. - Aortic valve: Moderately calcified annulus. Trileaflet; mildly calcified leaflets. There was overall moderate stenosis with relatively low gradients. There was trivial regurgitation.  Mean gradient (S): 14 mm Hg. Peak gradient (S): 27 mm Hg. VTI ratio of LVOT to aortic valve: 0.44. Valve area (VTI): 1.24 cm^2. - Mitral valve: Moderately calcified annulus. Mildly thickened leaflets. There was moderate regurgitation. - Left atrium: The atrium was severely dilated. - Right atrium: The atrium was mildly dilated. Central venous pressure (est): 3 mm Hg. - Atrial septum: No defect or patent foramen ovale was identified. - Tricuspid valve: There was moderate-severe regurgitation. - Pulmonary arteries: PA peak pressure: 55 mm Hg (S). - Pericardium, extracardiac: There was no pericardial effusion.  Nuclears stress test 07/24/12:  - Wall motion is good with EF 60%.  -  No evidence of scar or ischemia - No significant abnormalities   Past Medical History:  Diagnosis Date  . Afib ([place])   . Anemia   . Aortic stenosis    moderate by 07/19/17 echo  . Arthritis   . CAD (coronary artery disease)   . Chronic diastolic heart failure ([place])   . Dyspnea   . HTN (hypertension)   . LBBB (left bundle branch block)   . Mitral regurgitation    Moderate, echo, 07/2012  . Pulmonary hypertension ([place])     Past Surgical History:  Procedure Laterality Date  . JOINT REPLACEMENT Right Feb 2015   Dr. Case- [facility] Noss  . TOTAL KNEE ARTHROPLASTY Left  MEDICATIONS: . acetaminophen (TYLENOL) 500 MG tablet  . apixaban (ELIQUIS) 2.5 MG TABS tablet  . cyproheptadine (PERIACTIN) 2 MG/5ML syrup  . diltiazem (CARTIA XT) 240 MG 24 hr capsule  . ferrous sulfate 325 (65 FE) MG tablet  . furosemide (LASIX) 20 MG tablet  . traMADol (ULTRAM) 50 MG tablet  . vitamin B-12 (CYANOCOBALAMIN) 1000 MCG tablet   No current facility-administered medications for this encounter.    - Pt to stop eliquis 07/28/17.   If labs acceptable day of surgery, I anticipate pt can proceed with surgery as scheduled.  Willeen Cass, FNP-BC [facility] Short Stay Surgical Center/Anesthesiology Phone: [phone] 07/26/2017 9:55  AM

## 2017-07-25 NOTE — Telephone Encounter (Signed)
Called wanting results from echo

## 2017-07-26 ENCOUNTER — Telehealth (INDEPENDENT_AMBULATORY_CARE_PROVIDER_SITE_OTHER): Payer: Self-pay | Admitting: Orthopaedic Surgery

## 2017-07-26 NOTE — Telephone Encounter (Signed)
Spoke with Malachy Mood, pt's daughter and informed her of echo results. We also discussed surgical clearance form. I gave her the fax number to have it sent to our office for pt's upcoming hip replacement. She verbalized understanding. Had no additional questions.

## 2017-07-26 NOTE — Telephone Encounter (Signed)
Surgery 08/01/17  Left Total Hip Arthroplasty Anterior approach   Please call pt daughter to discuss surgery. Pt daughter would like to know if we need information from patients PCP.

## 2017-07-26 NOTE — Telephone Encounter (Signed)
Pt's daughter calling concerning prior message.  Please advise

## 2017-07-27 ENCOUNTER — Ambulatory Visit (HOSPITAL_COMMUNITY)
Admission: RE | Admit: 2017-07-27 | Discharge: 2017-07-27 | Disposition: A | Discharge status: Home / Self Care | Payer: Medicare Other | Source: Ambulatory Visit | Attending: Vascular Surgery | Admitting: Vascular Surgery

## 2017-07-27 ENCOUNTER — Ambulatory Visit (INDEPENDENT_AMBULATORY_CARE_PROVIDER_SITE_OTHER): Payer: Medicare Other | Admitting: Vascular Surgery

## 2017-07-27 ENCOUNTER — Other Ambulatory Visit: Payer: Self-pay

## 2017-07-27 ENCOUNTER — Encounter: Payer: Self-pay | Admitting: Vascular Surgery

## 2017-07-27 VITALS — BP 140/85 | HR 68 | Temp 97.1°F | Resp 14 | Ht 62.0 in | Wt 113.0 lb

## 2017-07-27 DIAGNOSIS — D649 Anemia, unspecified: Secondary | ICD-10-CM | POA: Insufficient documentation

## 2017-07-27 DIAGNOSIS — I5032 Chronic diastolic (congestive) heart failure: Secondary | ICD-10-CM | POA: Insufficient documentation

## 2017-07-27 DIAGNOSIS — Z79899 Other long term (current) drug therapy: Secondary | ICD-10-CM | POA: Insufficient documentation

## 2017-07-27 DIAGNOSIS — I83892 Varicose veins of left lower extremities with other complications: Secondary | ICD-10-CM

## 2017-07-27 DIAGNOSIS — Z96652 Presence of left artificial knee joint: Secondary | ICD-10-CM | POA: Diagnosis not present

## 2017-07-27 DIAGNOSIS — I110 Hypertensive heart disease with heart failure: Secondary | ICD-10-CM | POA: Diagnosis not present

## 2017-07-27 DIAGNOSIS — I4891 Unspecified atrial fibrillation: Secondary | ICD-10-CM | POA: Insufficient documentation

## 2017-07-27 DIAGNOSIS — Z01812 Encounter for preprocedural laboratory examination: Secondary | ICD-10-CM | POA: Insufficient documentation

## 2017-07-27 DIAGNOSIS — M1612 Unilateral primary osteoarthritis, left hip: Secondary | ICD-10-CM | POA: Diagnosis not present

## 2017-07-27 DIAGNOSIS — I272 Pulmonary hypertension, unspecified: Secondary | ICD-10-CM | POA: Insufficient documentation

## 2017-07-27 DIAGNOSIS — I080 Rheumatic disorders of both mitral and aortic valves: Secondary | ICD-10-CM | POA: Insufficient documentation

## 2017-07-27 DIAGNOSIS — Z7901 Long term (current) use of anticoagulants: Secondary | ICD-10-CM | POA: Insufficient documentation

## 2017-07-27 DIAGNOSIS — Z01818 Encounter for other preprocedural examination: Secondary | ICD-10-CM | POA: Insufficient documentation

## 2017-07-27 DIAGNOSIS — I2510 Atherosclerotic heart disease of native coronary artery without angina pectoris: Secondary | ICD-10-CM | POA: Insufficient documentation

## 2017-07-27 DIAGNOSIS — I447 Left bundle-branch block, unspecified: Secondary | ICD-10-CM | POA: Insufficient documentation

## 2017-07-27 NOTE — Progress Notes (Signed)
Vascular and Vein Specialist of [facility]  Patient name: Jody [name] MRN: [id number] DOB: [birth year] Sex: female  REASON FOR VISIT: Follow-up left great saphenous vein ablation on 07/13/2017  HPI: Jody [name] is a 84 y.o. female here today for follow-up.  She underwent uneventful ablation of her left great saphenous vein on 07/13/2017.  She does have a history of severe venous hypertension with venous ulceration bilaterally.  She did well with minimal discomfort following the procedure and has been compliant with her compression.  Past Medical History:  Diagnosis Date  . Afib ([place])   . Anemia   . Aortic stenosis    moderate by 07/19/17 echo  . Arthritis   . CAD (coronary artery disease)   . Chronic diastolic heart failure ([place])   . Dyspnea   . HTN (hypertension)   . LBBB (left bundle branch block)   . Mitral regurgitation    Moderate, echo, 07/2012  . Pulmonary hypertension ([place])   . Varicose veins of both lower extremities     Family History  Problem Relation Age of Onset  . Diabetes Mother     SOCIAL HISTORY: Social History   Tobacco Use  . Smoking status: Never Smoker  . Smokeless tobacco: Never Used  Substance Use Topics  . Alcohol use: No    Allergies  Allergen Reactions  . Levaquin [Levofloxacin] Other (See Comments)    Patient stated vision became blurry and became weak.    Current Outpatient Medications  Medication Sig Dispense Refill  . acetaminophen (TYLENOL) 500 MG tablet Take 500 mg by mouth daily as needed for moderate pain.    . cyproheptadine (PERIACTIN) 2 MG/5ML syrup   2  . diltiazem (CARTIA XT) 240 MG 24 hr capsule Take 1 capsule (240 mg total) by mouth daily. 90 capsule 3  . ferrous sulfate 325 (65 FE) MG tablet Take 1 tablet (325 mg total) by mouth daily. 90 tablet 1  . furosemide (LASIX) 20 MG tablet Take 1 tablet (20 mg total) by mouth daily as needed for edema. 30 tablet 3  . traMADol (ULTRAM) 50  MG tablet Take 1-2 tablets (50-100 mg total) by mouth every 12 (twelve) hours as needed. 60 tablet 5  . vitamin B-12 (CYANOCOBALAMIN) 1000 MCG tablet Take 1,000 mcg by mouth daily.    Marland Kitchen apixaban (ELIQUIS) 2.5 MG TABS tablet Take 1 tablet (2.5 mg total) by mouth 2 (two) times daily. (Patient not taking: Reported on 07/27/2017) 180 tablet 3  . cyproheptadine (PERIACTIN) 2 MG/5ML syrup Take 5 mLs (2 mg total) by mouth every 8 (eight) hours for 7 days, THEN 10 mLs (4 mg total) every 8 (eight) hours. (Patient not taking: Reported on 07/19/2017) 120 mL 2   No current facility-administered medications for this visit.     REVIEW OF SYSTEMS:  [X]  denotes positive finding, [ ]  denotes negative finding Cardiac  Comments:  Chest pain or chest pressure:    Shortness of breath upon exertion:    Short of breath when lying flat:    Irregular heart rhythm:        Vascular    Pain in calf, thigh, or hip brought on by ambulation:    Pain in feet at night that wakes you up from your sleep:     Blood clot in your veins:    Leg swelling:  x         PHYSICAL EXAM: Vitals:   07/27/17 0951  BP: 140/85  Pulse: 68  Resp: 14  Temp: (!) 97.1 F (36.2 C)  TempSrc: Oral  SpO2: 97%  Weight: 113 lb (51.3 kg)  Height: 5\' 2"  (1.575 m)    GENERAL: The patient is a well-nourished female, in no acute distress. The vital signs are documented above. CARDIOVASCULAR: Palpable thrombosed left great saphenous vein in the mid calf PULMONARY: There is good air exchange  MUSCULOSKELETAL: There are no major deformities or cyanosis. NEUROLOGIC: No focal weakness or paresthesias are detected. SKIN: There are no ulcers or rashes noted. PSYCHIATRIC: The patient has a normal affect.  DATA:  Duplex today shows no evidence of DVT.  There is closure of the vein from the proximal mid calf to below the saphenofemoral junction  MEDICAL ISSUES: Stable following staged bilateral great saphenous vein ablation.  She will wear her  compression garments routinely.  She will notify should she develop any new venous pathology.  She is scheduled to have left hip surgery with Dr. Ninfa Linden next week and is anxious to accomplish this to be able to walk again.    Jody Posner, MD FACS [facility] Specialists of [facility] Tel [phone] Pager [phone]

## 2017-07-28 ENCOUNTER — Other Ambulatory Visit (INDEPENDENT_AMBULATORY_CARE_PROVIDER_SITE_OTHER): Payer: Self-pay

## 2017-07-31 MED ORDER — CEFAZOLIN SODIUM-DEXTROSE 2-4 GM/100ML-% IV SOLN
2.0000 g | INTRAVENOUS | Status: AC
  Administered 2017-08-01: 2 g via INTRAVENOUS
  Filled 2017-07-31: qty 100

## 2017-07-31 MED ORDER — SODIUM CHLORIDE 0.9 % IV SOLN
1000.0000 mg | INTRAVENOUS | Status: AC
  Administered 2017-08-01: 1000 mg via INTRAVENOUS
  Filled 2017-07-31: qty 1100

## 2017-08-01 ENCOUNTER — Inpatient Hospital Stay (HOSPITAL_COMMUNITY): Payer: Medicare Other

## 2017-08-01 ENCOUNTER — Encounter (HOSPITAL_COMMUNITY): Payer: Self-pay | Admitting: *Deleted

## 2017-08-01 ENCOUNTER — Inpatient Hospital Stay (HOSPITAL_COMMUNITY)
Admission: RE | Admit: 2017-08-01 | Discharge: 2017-08-03 | DRG: 470 | Disposition: A | Discharge status: Home Health | Payer: Medicare Other | Attending: Orthopaedic Surgery | Admitting: Orthopaedic Surgery

## 2017-08-01 ENCOUNTER — Inpatient Hospital Stay (HOSPITAL_COMMUNITY): Payer: Medicare Other | Admitting: Emergency Medicine

## 2017-08-01 ENCOUNTER — Encounter (HOSPITAL_COMMUNITY)
Admission: RE | Disposition: A | Discharge status: Home Health | Payer: Self-pay | Source: Home / Self Care | Attending: Orthopaedic Surgery

## 2017-08-01 DIAGNOSIS — I4891 Unspecified atrial fibrillation: Secondary | ICD-10-CM | POA: Diagnosis not present

## 2017-08-01 DIAGNOSIS — M1612 Unilateral primary osteoarthritis, left hip: Secondary | ICD-10-CM | POA: Diagnosis present

## 2017-08-01 DIAGNOSIS — I5032 Chronic diastolic (congestive) heart failure: Secondary | ICD-10-CM | POA: Diagnosis present

## 2017-08-01 DIAGNOSIS — G3184 Mild cognitive impairment, so stated: Secondary | ICD-10-CM | POA: Diagnosis not present

## 2017-08-01 DIAGNOSIS — M25452 Effusion, left hip: Secondary | ICD-10-CM | POA: Diagnosis present

## 2017-08-01 DIAGNOSIS — I110 Hypertensive heart disease with heart failure: Secondary | ICD-10-CM | POA: Diagnosis not present

## 2017-08-01 DIAGNOSIS — I080 Rheumatic disorders of both mitral and aortic valves: Secondary | ICD-10-CM | POA: Diagnosis present

## 2017-08-01 DIAGNOSIS — Z96652 Presence of left artificial knee joint: Secondary | ICD-10-CM | POA: Diagnosis not present

## 2017-08-01 DIAGNOSIS — I209 Angina pectoris, unspecified: Secondary | ICD-10-CM | POA: Diagnosis not present

## 2017-08-01 DIAGNOSIS — I447 Left bundle-branch block, unspecified: Secondary | ICD-10-CM | POA: Diagnosis present

## 2017-08-01 DIAGNOSIS — Z881 Allergy status to other antibiotic agents status: Secondary | ICD-10-CM | POA: Diagnosis not present

## 2017-08-01 DIAGNOSIS — L97101 Non-pressure chronic ulcer of unspecified thigh limited to breakdown of skin: Secondary | ICD-10-CM

## 2017-08-01 DIAGNOSIS — M150 Primary generalized (osteo)arthritis: Secondary | ICD-10-CM

## 2017-08-01 DIAGNOSIS — I2510 Atherosclerotic heart disease of native coronary artery without angina pectoris: Secondary | ICD-10-CM | POA: Diagnosis not present

## 2017-08-01 DIAGNOSIS — I272 Pulmonary hypertension, unspecified: Secondary | ICD-10-CM | POA: Diagnosis present

## 2017-08-01 DIAGNOSIS — I739 Peripheral vascular disease, unspecified: Secondary | ICD-10-CM | POA: Diagnosis not present

## 2017-08-01 DIAGNOSIS — Z471 Aftercare following joint replacement surgery: Secondary | ICD-10-CM | POA: Diagnosis not present

## 2017-08-01 DIAGNOSIS — M25752 Osteophyte, left hip: Secondary | ICD-10-CM | POA: Diagnosis not present

## 2017-08-01 DIAGNOSIS — Z419 Encounter for procedure for purposes other than remedying health state, unspecified: Secondary | ICD-10-CM

## 2017-08-01 DIAGNOSIS — I83001 Varicose veins of unspecified lower extremity with ulcer of thigh: Secondary | ICD-10-CM

## 2017-08-01 DIAGNOSIS — M159 Polyosteoarthritis, unspecified: Secondary | ICD-10-CM

## 2017-08-01 DIAGNOSIS — M25552 Pain in left hip: Secondary | ICD-10-CM | POA: Diagnosis present

## 2017-08-01 DIAGNOSIS — I10 Essential (primary) hypertension: Secondary | ICD-10-CM | POA: Diagnosis not present

## 2017-08-01 DIAGNOSIS — Z96642 Presence of left artificial hip joint: Secondary | ICD-10-CM

## 2017-08-01 HISTORY — PX: TOTAL HIP ARTHROPLASTY: SHX124

## 2017-08-01 LAB — PROTIME-INR
INR: 1.06
Prothrombin Time: 13.7 seconds (ref 11.4–15.2)

## 2017-08-01 SURGERY — ARTHROPLASTY, HIP, TOTAL, ANTERIOR APPROACH
Anesthesia: General | Laterality: Left

## 2017-08-01 MED ORDER — FENTANYL CITRATE (PF) 250 MCG/5ML IJ SOLN
INTRAMUSCULAR | Status: AC
  Filled 2017-08-01: qty 5

## 2017-08-01 MED ORDER — ONDANSETRON HCL 4 MG/2ML IJ SOLN
INTRAMUSCULAR | Status: DC | PRN
  Administered 2017-08-01: 4 mg via INTRAVENOUS

## 2017-08-01 MED ORDER — FENTANYL CITRATE (PF) 100 MCG/2ML IJ SOLN
INTRAMUSCULAR | Status: DC | PRN
  Administered 2017-08-01: 50 ug via INTRAVENOUS
  Administered 2017-08-01: 100 ug via INTRAVENOUS
  Administered 2017-08-01 (×2): 50 ug via INTRAVENOUS

## 2017-08-01 MED ORDER — SODIUM CHLORIDE 0.9 % IR SOLN
Status: DC | PRN
  Administered 2017-08-01: 3000 mL

## 2017-08-01 MED ORDER — ACETAMINOPHEN 325 MG PO TABS: 325.0000 mg | ORAL_TABLET | Freq: Four times a day (QID) | ORAL | Status: DC | PRN

## 2017-08-01 MED ORDER — MORPHINE SULFATE (PF) 2 MG/ML IV SOLN: 0.5000 mg | INTRAVENOUS | Status: DC | PRN

## 2017-08-01 MED ORDER — METHOCARBAMOL 500 MG PO TABS: 500.0000 mg | ORAL_TABLET | Freq: Four times a day (QID) | ORAL | Status: DC | PRN

## 2017-08-01 MED ORDER — POLYETHYLENE GLYCOL 3350 17 G PO PACK: 17.0000 g | PACK | Freq: Every day | ORAL | Status: DC | PRN

## 2017-08-01 MED ORDER — DEXAMETHASONE SODIUM PHOSPHATE 10 MG/ML IJ SOLN
INTRAMUSCULAR | Status: AC
  Filled 2017-08-01: qty 1

## 2017-08-01 MED ORDER — CHLORHEXIDINE GLUCONATE 4 % EX LIQD: 60.0000 mL | Freq: Once | CUTANEOUS | Status: DC

## 2017-08-01 MED ORDER — ONDANSETRON HCL 4 MG/2ML IJ SOLN
INTRAMUSCULAR | Status: AC
  Filled 2017-08-01: qty 2

## 2017-08-01 MED ORDER — SODIUM CHLORIDE 0.9 % IV SOLN
INTRAVENOUS | Status: DC
  Administered 2017-08-01: 18:00:00 via INTRAVENOUS

## 2017-08-01 MED ORDER — LIDOCAINE 2% (20 MG/ML) 5 ML SYRINGE
INTRAMUSCULAR | Status: AC
  Filled 2017-08-01: qty 5

## 2017-08-01 MED ORDER — ONDANSETRON HCL 4 MG PO TABS: 4.0000 mg | ORAL_TABLET | Freq: Four times a day (QID) | ORAL | Status: DC | PRN

## 2017-08-01 MED ORDER — APIXABAN 2.5 MG PO TABS
2.5000 mg | ORAL_TABLET | Freq: Two times a day (BID) | ORAL | Status: DC
  Administered 2017-08-02 – 2017-08-03 (×3): 2.5 mg via ORAL
  Filled 2017-08-01 (×3): qty 1

## 2017-08-01 MED ORDER — FENTANYL CITRATE (PF) 100 MCG/2ML IJ SOLN
INTRAMUSCULAR | Status: AC
  Administered 2017-08-01: 50 ug via INTRAVENOUS
  Filled 2017-08-01: qty 2

## 2017-08-01 MED ORDER — DIPHENHYDRAMINE HCL 12.5 MG/5ML PO ELIX: 12.5000 mg | ORAL_SOLUTION | ORAL | Status: DC | PRN

## 2017-08-01 MED ORDER — ALUM & MAG HYDROXIDE-SIMETH 200-200-20 MG/5ML PO SUSP: 30.0000 mL | ORAL | Status: DC | PRN

## 2017-08-01 MED ORDER — ONDANSETRON HCL 4 MG/2ML IJ SOLN: 4.0000 mg | Freq: Four times a day (QID) | INTRAMUSCULAR | Status: DC | PRN

## 2017-08-01 MED ORDER — DILTIAZEM HCL ER COATED BEADS 240 MG PO CP24
240.0000 mg | ORAL_CAPSULE | Freq: Every day | ORAL | Status: DC
  Administered 2017-08-02 – 2017-08-03 (×2): 240 mg via ORAL
  Filled 2017-08-01 (×2): qty 1

## 2017-08-01 MED ORDER — METOCLOPRAMIDE HCL 5 MG PO TABS: 5.0000 mg | ORAL_TABLET | Freq: Three times a day (TID) | ORAL | Status: DC | PRN

## 2017-08-01 MED ORDER — DOCUSATE SODIUM 100 MG PO CAPS
100.0000 mg | ORAL_CAPSULE | Freq: Two times a day (BID) | ORAL | Status: DC
  Administered 2017-08-01 – 2017-08-03 (×4): 100 mg via ORAL
  Filled 2017-08-01 (×4): qty 1

## 2017-08-01 MED ORDER — PROPOFOL 10 MG/ML IV BOLUS
INTRAVENOUS | Status: AC
  Filled 2017-08-01: qty 20

## 2017-08-01 MED ORDER — FENTANYL CITRATE (PF) 100 MCG/2ML IJ SOLN
INTRAMUSCULAR | Status: AC
  Administered 2017-08-01: 25 ug via INTRAVENOUS
  Filled 2017-08-01: qty 2

## 2017-08-01 MED ORDER — PROPOFOL 10 MG/ML IV BOLUS
INTRAVENOUS | Status: DC | PRN
  Administered 2017-08-01: 120 mg via INTRAVENOUS

## 2017-08-01 MED ORDER — FUROSEMIDE 20 MG PO TABS: 20.0000 mg | ORAL_TABLET | Freq: Every day | ORAL | Status: DC | PRN

## 2017-08-01 MED ORDER — TRAMADOL HCL 50 MG PO TABS: 50.0000 mg | ORAL_TABLET | Freq: Four times a day (QID) | ORAL | Status: DC | PRN

## 2017-08-01 MED ORDER — SUGAMMADEX SODIUM 200 MG/2ML IV SOLN
INTRAVENOUS | Status: AC
  Filled 2017-08-01: qty 2

## 2017-08-01 MED ORDER — PHENOL 1.4 % MT LIQD: 1.0000 | OROMUCOSAL | Status: DC | PRN

## 2017-08-01 MED ORDER — METHOCARBAMOL 1000 MG/10ML IJ SOLN
500.0000 mg | Freq: Four times a day (QID) | INTRAVENOUS | Status: DC | PRN
  Filled 2017-08-01: qty 5

## 2017-08-01 MED ORDER — LIDOCAINE HCL (CARDIAC) PF 100 MG/5ML IV SOSY
PREFILLED_SYRINGE | INTRAVENOUS | Status: DC | PRN
  Administered 2017-08-01: 80 mg via INTRAVENOUS

## 2017-08-01 MED ORDER — SUGAMMADEX SODIUM 200 MG/2ML IV SOLN
INTRAVENOUS | Status: DC | PRN
  Administered 2017-08-01: 100 mg via INTRAVENOUS

## 2017-08-01 MED ORDER — LACTATED RINGERS IV SOLN
INTRAVENOUS | Status: DC
  Administered 2017-08-01 (×2): via INTRAVENOUS

## 2017-08-01 MED ORDER — PANTOPRAZOLE SODIUM 40 MG PO TBEC
40.0000 mg | DELAYED_RELEASE_TABLET | Freq: Every day | ORAL | Status: DC
  Administered 2017-08-02 – 2017-08-03 (×2): 40 mg via ORAL
  Filled 2017-08-01 (×2): qty 1

## 2017-08-01 MED ORDER — DEXAMETHASONE SODIUM PHOSPHATE 10 MG/ML IJ SOLN
INTRAMUSCULAR | Status: DC | PRN
  Administered 2017-08-01: 10 mg via INTRAVENOUS

## 2017-08-01 MED ORDER — MENTHOL 3 MG MT LOZG: 1.0000 | LOZENGE | OROMUCOSAL | Status: DC | PRN

## 2017-08-01 MED ORDER — VITAMIN B-12 1000 MCG PO TABS
1000.0000 ug | ORAL_TABLET | Freq: Every day | ORAL | Status: DC
  Administered 2017-08-02 – 2017-08-03 (×2): 1000 ug via ORAL
  Filled 2017-08-01 (×2): qty 1

## 2017-08-01 MED ORDER — ROCURONIUM BROMIDE 100 MG/10ML IV SOLN
INTRAVENOUS | Status: DC | PRN
  Administered 2017-08-01: 40 mg via INTRAVENOUS

## 2017-08-01 MED ORDER — HYDROCODONE-ACETAMINOPHEN 5-325 MG PO TABS
1.0000 | ORAL_TABLET | ORAL | Status: DC | PRN
  Administered 2017-08-02 – 2017-08-03 (×3): 1 via ORAL
  Filled 2017-08-01 (×3): qty 1

## 2017-08-01 MED ORDER — FENTANYL CITRATE (PF) 100 MCG/2ML IJ SOLN
25.0000 ug | INTRAMUSCULAR | Status: DC | PRN
  Administered 2017-08-01: 50 ug via INTRAVENOUS
  Administered 2017-08-01 (×2): 25 ug via INTRAVENOUS

## 2017-08-01 MED ORDER — HYDROCODONE-ACETAMINOPHEN 7.5-325 MG PO TABS: 1.0000 | ORAL_TABLET | ORAL | Status: DC | PRN

## 2017-08-01 MED ORDER — METOCLOPRAMIDE HCL 5 MG/ML IJ SOLN: 5.0000 mg | Freq: Three times a day (TID) | INTRAMUSCULAR | Status: DC | PRN

## 2017-08-01 MED ORDER — FERROUS SULFATE 325 (65 FE) MG PO TABS
325.0000 mg | ORAL_TABLET | Freq: Every day | ORAL | Status: DC
  Administered 2017-08-02 – 2017-08-03 (×2): 325 mg via ORAL
  Filled 2017-08-01 (×2): qty 1

## 2017-08-01 MED ORDER — ACETAMINOPHEN 500 MG PO TABS
500.0000 mg | ORAL_TABLET | Freq: Four times a day (QID) | ORAL | Status: AC
  Administered 2017-08-02 (×3): 500 mg via ORAL
  Filled 2017-08-01 (×3): qty 1

## 2017-08-01 MED ORDER — CEFAZOLIN SODIUM-DEXTROSE 1-4 GM/50ML-% IV SOLN
1.0000 g | Freq: Four times a day (QID) | INTRAVENOUS | Status: AC
  Administered 2017-08-01 – 2017-08-02 (×2): 1 g via INTRAVENOUS
  Filled 2017-08-01 (×2): qty 50

## 2017-08-01 MED ORDER — 0.9 % SODIUM CHLORIDE (POUR BTL) OPTIME
TOPICAL | Status: DC | PRN
  Administered 2017-08-01: 1000 mL

## 2017-08-01 SURGICAL SUPPLY — 54 items
APL SKNCLS STERI-STRIP NONHPOA (GAUZE/BANDAGES/DRESSINGS) ×1
BENZOIN TINCTURE PRP APPL 2/3 (GAUZE/BANDAGES/DRESSINGS) ×3 IMPLANT
BLADE CLIPPER SURG (BLADE) IMPLANT
BLADE SAW SGTL 18X1.27X75 (BLADE) ×2 IMPLANT
BLADE SAW SGTL 18X1.27X75MM (BLADE) ×1
CAPT HIP TOTAL 2 ×2 IMPLANT
CELLS DAT CNTRL 66122 CELL SVR (MISCELLANEOUS) ×1 IMPLANT
CLOSURE WOUND 1/2 X4 (GAUZE/BANDAGES/DRESSINGS) ×1
COVER SURGICAL LIGHT HANDLE (MISCELLANEOUS) ×3 IMPLANT
DRAPE C-ARM 42X72 X-RAY (DRAPES) ×3 IMPLANT
DRAPE STERI IOBAN 125X83 (DRAPES) ×3 IMPLANT
DRAPE U-SHAPE 47X51 STRL (DRAPES) ×9 IMPLANT
DRSG AQUACEL AG ADV 3.5X10 (GAUZE/BANDAGES/DRESSINGS) ×3 IMPLANT
DURAPREP 26ML APPLICATOR (WOUND CARE) ×3 IMPLANT
ELECT BLADE 4.0 EZ CLEAN MEGAD (MISCELLANEOUS) ×3
ELECT BLADE 6.5 EXT (BLADE) IMPLANT
ELECT REM PT RETURN 9FT ADLT (ELECTROSURGICAL) ×3
ELECTRODE BLDE 4.0 EZ CLN MEGD (MISCELLANEOUS) ×1 IMPLANT
ELECTRODE REM PT RTRN 9FT ADLT (ELECTROSURGICAL) ×1 IMPLANT
FACESHIELD WRAPAROUND (MASK) ×6 IMPLANT
FACESHIELD WRAPAROUND OR TEAM (MASK) ×2 IMPLANT
GLOVE BIOGEL PI IND STRL 8 (GLOVE) ×2 IMPLANT
GLOVE BIOGEL PI INDICATOR 8 (GLOVE) ×4
GLOVE ECLIPSE 8.0 STRL XLNG CF (GLOVE) ×3 IMPLANT
GLOVE ORTHO TXT STRL SZ7.5 (GLOVE) ×6 IMPLANT
GOWN STRL REUS W/ TWL LRG LVL3 (GOWN DISPOSABLE) ×2 IMPLANT
GOWN STRL REUS W/ TWL XL LVL3 (GOWN DISPOSABLE) ×2 IMPLANT
GOWN STRL REUS W/TWL LRG LVL3 (GOWN DISPOSABLE) ×6
GOWN STRL REUS W/TWL XL LVL3 (GOWN DISPOSABLE) ×6
HANDPIECE INTERPULSE COAX TIP (DISPOSABLE) ×3
KIT BASIN OR (CUSTOM PROCEDURE TRAY) ×3 IMPLANT
KIT TURNOVER KIT B (KITS) ×3 IMPLANT
MANIFOLD NEPTUNE II (INSTRUMENTS) ×3 IMPLANT
NS IRRIG 1000ML POUR BTL (IV SOLUTION) ×3 IMPLANT
PACK TOTAL JOINT (CUSTOM PROCEDURE TRAY) ×3 IMPLANT
PAD ARMBOARD 7.5X6 YLW CONV (MISCELLANEOUS) ×3 IMPLANT
RETRACTOR WND ALEXIS 18 MED (MISCELLANEOUS) ×1 IMPLANT
RTRCTR WOUND ALEXIS 18CM MED (MISCELLANEOUS) ×3
SET HNDPC FAN SPRY TIP SCT (DISPOSABLE) ×1 IMPLANT
STAPLER VISISTAT 35W (STAPLE) IMPLANT
STRIP CLOSURE SKIN 1/2X4 (GAUZE/BANDAGES/DRESSINGS) ×3 IMPLANT
SUT ETHIBOND NAB CT1 #1 30IN (SUTURE) ×3 IMPLANT
SUT MNCRL AB 4-0 PS2 18 (SUTURE) IMPLANT
SUT VIC AB 0 CT1 27 (SUTURE) ×3
SUT VIC AB 0 CT1 27XBRD ANBCTR (SUTURE) ×1 IMPLANT
SUT VIC AB 1 CT1 27 (SUTURE) ×3
SUT VIC AB 1 CT1 27XBRD ANBCTR (SUTURE) ×1 IMPLANT
SUT VIC AB 2-0 CT1 27 (SUTURE) ×3
SUT VIC AB 2-0 CT1 TAPERPNT 27 (SUTURE) ×1 IMPLANT
TOWEL OR 17X24 6PK STRL BLUE (TOWEL DISPOSABLE) ×3 IMPLANT
TOWEL OR 17X26 10 PK STRL BLUE (TOWEL DISPOSABLE) ×3 IMPLANT
TRAY CATH 16FR W/PLASTIC CATH (SET/KITS/TRAYS/PACK) IMPLANT
TRAY FOLEY MTR SLVR 16FR STAT (SET/KITS/TRAYS/PACK) IMPLANT
WATER STERILE IRR 1000ML POUR (IV SOLUTION) ×6 IMPLANT

## 2017-08-01 NOTE — Anesthesia Preprocedure Evaluation (Signed)
Anesthesia Evaluation  Patient identified by MRN, date of birth, ID band Patient awake    Reviewed: Allergy & Precautions, NPO status , Patient's Chart, lab work & pertinent test results  Airway Mallampati: II  TM Distance: >3 FB     Dental   Pulmonary shortness of breath,    breath sounds clear to auscultation       Cardiovascular hypertension, + CAD and + Peripheral Vascular Disease  + dysrhythmias  Rhythm:Regular Rate:Normal     Neuro/Psych    GI/Hepatic negative GI ROS, Neg liver ROS,   Endo/Other  negative endocrine ROS  Renal/GU negative Renal ROS     Musculoskeletal  (+) Arthritis ,   Abdominal   Peds  Hematology  (+) anemia ,   Anesthesia Other Findings   Reproductive/Obstetrics                             Anesthesia Physical Anesthesia Plan  ASA: III  Anesthesia Plan: General   Post-op Pain Management:    Induction: Intravenous  PONV Risk Score and Plan: 3 and Treatment may vary due to age or medical condition  Airway Management Planned: Oral ETT  Additional Equipment:   Intra-op Plan:   Post-operative Plan: Possible Post-op intubation/ventilation  Informed Consent: I have reviewed the patients History and Physical, chart, labs and discussed the procedure including the risks, benefits and alternatives for the proposed anesthesia with the patient or authorized representative who has indicated his/her understanding and acceptance.   Dental advisory given  Plan Discussed with:   Anesthesia Plan Comments:         Anesthesia Quick Evaluation

## 2017-08-01 NOTE — Brief Op Note (Signed)
08/01/2017  4:10 PM  PATIENT:  Jody [name]  84 y.o. female  PRE-OPERATIVE DIAGNOSIS:  osteoarthritis left hip  POST-OPERATIVE DIAGNOSIS:  osteoarthritis left hip  PROCEDURE:  Procedure(s): LEFT TOTAL HIP ARTHROPLASTY ANTERIOR APPROACH (Left)  SURGEON:  Surgeon(s) and Role:    Mcarthur Rossetti, MD - Primary  PHYSICIAN ASSISTANT: Benita Stabile, PA-C  ANESTHESIA:   general  EBL:  200 mL   COUNTS:  YES  DICTATION: .Other Dictation: Dictation Number [id number]  PLAN OF CARE: Admit to inpatient   PATIENT DISPOSITION:  PACU - hemodynamically stable.   Delay start of Pharmacological VTE agent (>24hrs) due to surgical blood loss or risk of bleeding: no

## 2017-08-01 NOTE — Anesthesia Procedure Notes (Signed)
Procedure Name: Intubation Date/Time: 08/01/2017 2:45 PM Performed by: Lance Coon, CRNA Pre-anesthesia Checklist: Emergency Drugs available, Suction available, Patient identified, Patient being monitored and Timeout performed Patient Re-evaluated:Patient Re-evaluated prior to induction Oxygen Delivery Method: Circle system utilized Preoxygenation: Pre-oxygenation with 100% oxygen Induction Type: IV induction Ventilation: Mask ventilation without difficulty Laryngoscope Size: Miller and 3 Grade View: Grade I Tube type: Oral Tube size: 7.0 mm Number of attempts: 1 Airway Equipment and Method: Stylet Placement Confirmation: positive ETCO2,  breath sounds checked- equal and bilateral and ETT inserted through vocal cords under direct vision Secured at: 21 cm Tube secured with: Tape Dental Injury: Teeth and Oropharynx as per pre-operative assessment

## 2017-08-01 NOTE — Transfer of Care (Signed)
Immediate Anesthesia Transfer of Care Note  Patient: Jody [name]  Procedure(s) Performed: LEFT TOTAL HIP ARTHROPLASTY ANTERIOR APPROACH (Left )  Patient Location: PACU  Anesthesia Type:General  Level of Consciousness: awake and patient cooperative  Airway & Oxygen Therapy: Patient Spontanous Breathing  Post-op Assessment: Report given to RN and Post -op Vital signs reviewed and stable  Post vital signs: Reviewed and stable  Last Vitals:  Vitals Value Taken Time  BP    Temp    Pulse 76 08/01/2017  4:25 PM  Resp 33 08/01/2017  4:25 PM  SpO2 93 % 08/01/2017  4:25 PM  Vitals shown include unvalidated device data.  Last Pain:  Vitals:   08/01/17 1625  TempSrc:   PainSc: (P) 0-No pain         Complications: No apparent anesthesia complications

## 2017-08-01 NOTE — Anesthesia Postprocedure Evaluation (Signed)
Anesthesia Post Note  Patient: Jody [name]  Procedure(s) Performed: LEFT TOTAL HIP ARTHROPLASTY ANTERIOR APPROACH (Left )     Patient location during evaluation: PACU Anesthesia Type: General Level of consciousness: awake and alert Pain management: pain level controlled Vital Signs Assessment: post-procedure vital signs reviewed and stable Respiratory status: spontaneous breathing, nonlabored ventilation, respiratory function stable and patient connected to nasal cannula oxygen Cardiovascular status: blood pressure returned to baseline and stable Postop Assessment: no apparent nausea or vomiting Anesthetic complications: no    Last Vitals:  Vitals:   08/01/17 1655 08/01/17 1710  BP: (!) 144/78 (!) 161/76  Pulse: 82   Resp: 20 18  Temp:    SpO2: 98%     Last Pain:  Vitals:   08/01/17 1714  TempSrc:   PainSc: 3                  Anisia Leija,W. EDMOND

## 2017-08-01 NOTE — H&P (Signed)
TOTAL HIP ADMISSION H&P  Patient is admitted for left total hip arthroplasty.  Subjective:  Chief Complaint: left hip pain  HPI: Jody [name], 84 y.o. female, has a history of pain and functional disability in the left hip(s) due to arthritis and patient has failed non-surgical conservative treatments for greater than 12 weeks to include NSAID's and/or analgesics, corticosteriod injections, flexibility and strengthening excercises, use of assistive devices and activity modification.  Onset of symptoms was gradual starting 2 years ago with rapidlly worsening course since that time.The patient noted no past surgery on the left hip(s).  Patient currently rates pain in the left hip at 10 out of 10 with activity. Patient has night pain, worsening of pain with activity and weight bearing, trendelenberg gait, pain that interfers with activities of daily living, pain with passive range of motion and crepitus. Patient has evidence of subchondral cysts, subchondral sclerosis, periarticular osteophytes and joint space narrowing by imaging studies. This condition presents safety issues increasing the risk of falls.  There is no current active infection.  Patient Active Problem List   Diagnosis Date Noted  . Pain of left hip joint 07/12/2017  . Unilateral primary osteoarthritis, left hip 07/12/2017  . PVD (peripheral vascular disease) ([place]) 07/05/2016  . Venous stasis ulcer ([place]) 03/15/2016  . Cellulitis 12/08/2015  . (HFpEF) heart failure with preserved ejection fraction ([place]) 04/16/2013  . Mitral regurgitation   . Anemia   . HTN (hypertension)   . Atrial fibrillation ([place]) 04/02/2009  . DIASTOLIC HEART FAILURE, CHRONIC 04/02/2009   Past Medical History:  Diagnosis Date  . Afib ([place])   . Anemia   . Aortic stenosis    moderate by 07/19/17 echo  . Arthritis   . CAD (coronary artery disease)   . Chronic diastolic heart failure ([place])   . Dyspnea   . HTN (hypertension)   . LBBB (left bundle  branch block)   . Mitral regurgitation    Moderate, echo, 07/2012  . Pulmonary hypertension ([place])   . Varicose veins of both lower extremities     Past Surgical History:  Procedure Laterality Date  . JOINT REPLACEMENT Right Feb 2015   Dr. Case- [facility] Noss  . TOTAL KNEE ARTHROPLASTY Left   . varicose vein laser ablation Bilateral     Current Facility-Administered Medications  Medication Dose Route Frequency Provider Last Rate Last Dose  . ceFAZolin (ANCEF) IVPB 2g/100 mL premix  2 g Intravenous To SS-Surg Mcarthur Rossetti, MD      . chlorhexidine (HIBICLENS) 4 % liquid 4 application  60 mL Topical Once Erskine Emery W, PA-C      . tranexamic acid (CYKLOKAPRON) 1,000 mg in sodium chloride 0.9 % 100 mL IVPB  1,000 mg Intravenous To OR Mcarthur Rossetti, MD       Allergies  Allergen Reactions  . Levaquin [Levofloxacin] Other (See Comments)    Patient stated vision became blurry and became weak.    Social History   Tobacco Use  . Smoking status: Never Smoker  . Smokeless tobacco: Never Used  Substance Use Topics  . Alcohol use: No    Family History  Problem Relation Age of Onset  . Diabetes Mother      Review of Systems  Musculoskeletal: Positive for joint pain.  All other systems reviewed and are negative.   Objective:  Physical Exam  Constitutional: She is oriented to person, place, and time. She appears well-developed and well-nourished.  HENT:  Head: Normocephalic and atraumatic.  Eyes:  EOM are normal.  Neck: Normal range of motion. Neck supple.  Cardiovascular: Normal rate.  Respiratory: Effort normal.  GI: Soft. Bowel sounds are normal.  Musculoskeletal:       Left hip: She exhibits decreased range of motion, decreased strength, tenderness and bony tenderness.  Neurological: She is alert and oriented to person, place, and time.  Skin: Skin is warm.  Psychiatric: She has a normal mood and affect.    Vital signs in last 24 hours:     Labs:   Estimated body mass index is 20.67 kg/m as calculated from the following:   Height as of 07/27/17: 5\' 2"  (1.575 m).   Weight as of 07/27/17: 113 lb (51.3 kg).   Imaging Review Plain radiographs demonstrate severe degenerative joint disease of the left hip(s). The bone quality appears to be good for age and reported activity level.    Preoperative templating of the joint replacement has been completed, documented, and submitted to the Operating Room personnel in order to optimize intra-operative equipment management.   Anticipated LOS equal to or greater than 2 midnights due to - Age 74 and older with one or more of the following:  - Obesity  - Expected need for hospital services (PT, OT, Nursing) required for safe  discharge  - Anticipated need for postoperative skilled nursing care or inpatient rehab  - Active co-morbidities: Coronary Artery Disease and Cardiac Arrhythmia OR   - Unanticipated findings during/Post Surgery: None  - Patient is a high risk of re-admission due to: None     Assessment/Plan:  End stage arthritis, left hip(s)  The patient history, physical examination, clinical judgement of the provider and imaging studies are consistent with end stage degenerative joint disease of the left hip(s) and total hip arthroplasty is deemed medically necessary. The treatment options including medical management, injection therapy, arthroscopy and arthroplasty were discussed at length. The risks and benefits of total hip arthroplasty were presented and reviewed. The risks due to aseptic loosening, infection, stiffness, dislocation/subluxation,  thromboembolic complications and other imponderables were discussed.  The patient acknowledged the explanation, agreed to proceed with the plan and consent was signed. Patient is being admitted for inpatient treatment for surgery, pain control, PT, OT, prophylactic antibiotics, VTE prophylaxis, progressive ambulation and ADL's  and discharge planning.The patient is planning to be discharged home with home health services

## 2017-08-02 ENCOUNTER — Encounter (HOSPITAL_COMMUNITY): Payer: Self-pay | Admitting: Orthopaedic Surgery

## 2017-08-02 LAB — CBC
HEMATOCRIT: 33.6 % — AB (ref 36.0–46.0)
Hemoglobin: 10.8 g/dL — ABNORMAL LOW (ref 12.0–15.0)
MCH: 30.4 pg (ref 26.0–34.0)
MCHC: 32.1 g/dL (ref 30.0–36.0)
MCV: 94.6 fL (ref 78.0–100.0)
Platelets: 250 10*3/uL (ref 150–400)
RBC: 3.55 MIL/uL — ABNORMAL LOW (ref 3.87–5.11)
RDW: 14.2 % (ref 11.5–15.5)
WBC: 6.7 10*3/uL (ref 4.0–10.5)

## 2017-08-02 LAB — BASIC METABOLIC PANEL
Anion gap: 7 (ref 5–15)
BUN: 17 mg/dL (ref 6–20)
CALCIUM: 8.8 mg/dL — AB (ref 8.9–10.3)
CO2: 27 mmol/L (ref 22–32)
Chloride: 102 mmol/L (ref 101–111)
Creatinine, Ser: 0.99 mg/dL (ref 0.44–1.00)
GFR calc Af Amer: 57 mL/min — ABNORMAL LOW (ref >60)
GFR calc non Af Amer: 49 mL/min — ABNORMAL LOW (ref >60)
GLUCOSE: 159 mg/dL — AB (ref 65–99)
Potassium: 5 mmol/L (ref 3.5–5.1)
Sodium: 136 mmol/L (ref 135–145)

## 2017-08-02 NOTE — Op Note (Signed)
NAME:  Jody [name], Jody [name]                   ACCOUNT NO.:  MEDICAL RECORD NO.:  [id number]  LOCATION:                                 FACILITY:  PHYSICIAN:  Lind Guest. Ninfa Linden, M.D.DATE OF BIRTH:  DATE OF PROCEDURE:  08/01/2017 DATE OF DISCHARGE:                              OPERATIVE REPORT   PREOPERATIVE DIAGNOSIS:  Primary osteoarthritis and degenerative joint disease, left hip.  POSTOPERATIVE DIAGNOSIS:  Primary osteoarthritis and degenerative joint disease, left hip.  PROCEDURE:  Left total hip arthroplasty through direct anterior approach.  IMPLANTS:  DePuy Sector Gription acetabular component size 52, size 36 +0 polyethylene liner, size 10 Corail femoral component with standard offset, size 36 -2 metal hip ball.  SURGEON:  Lind Guest. Ninfa Linden, M.D.  ASSISTANT:  Erskine Emery, PA-C.  ANESTHESIA:  General.  ANTIBIOTICS:  2 g of IV Ancef.  BLOOD LOSS:  200 cc.  COMPLICATIONS:  None.  INDICATION:  The patient is an 84 year old female with severe debilitating arthritis involving her left hip.  This is well documented with x-rays, showing complete loss of the joint space.  Her pain is daily, and at this point, it has detrimentally affected her activities of daily living, her quality of life, and her mobility.  She wished to proceed with a total hip arthroplasty through direct anterior approach at this point.  She understands fully the risks of acute blood loss anemia, nerve and vessel injury, fracture, infection, dislocation, and DVT.  She understands her goals are to decrease pain, improve mobility, and overall improved quality of life.  PROCEDURE DESCRIPTION:  After informed consent was obtained, appropriate left hip was marked.  She was brought to the operating room and general anesthesia obtained while she was on her stretcher.  Traction boots were placed on both of her feet and she was placed supine on the Hana fracture table with the perineal post in  place and both legs in inline skeletal traction devices, but no traction applied.  Her left operative hip was prepped and draped with DuraPrep and sterile drapes.  A time-out was called and she was identified as correct patient and correct left hip.  We then made an incision just inferior and posterior to the anterior superior iliac spine and carried this obliquely down the leg. We dissected down the tensor fascia lata muscle.  The tensor fascia was then divided longitudinally to proceed with direct anterior approach to the hip.  We identified and cauterized the circumflex vessels and then identified the hip capsule.  We opened up the hip capsule in an L-type format, finding a moderate joint effusion and significant arthritis around her left hip.  We placed Cobra retractors around the medial and lateral femoral neck and then made our femoral neck cut with an oscillating saw and completed this with an osteotome.  We placed a corkscrew guide in the femoral head and removed the femoral head in its entirety and found to be significantly devoid of cartilage.  We then cleaned the acetabulum, remnants of the acetabular labrum and other debris including periarticular osteophytes.  I placed a bent Hohmann over the medial acetabular rim and then began reaming under direct visualization  from a size 43 reamer in stepwise increments up to a size 51 with all reamers under direct visualization, the last reamer under direct fluoroscopy, so we could obtain our depth of reaming, our inclination and anteversion.  We then were able to place the real DePuy Sector Gription acetabular component size 52, and then a 36 +0 neutral polyethylene liner for that size 52 acetabular component.  Attention was then turned to the femur.  With the leg externally rotated to 120 degrees, extended and adducted, we were able to place a Mueller retractor medially and a Hohmann retractor behind the greater trochanter.  We  released the lateral joint capsule and used a box cutting osteotome to the enter the femoral canal and a rongeur to lateralize.  We then began broaching from a size 8 broach using the Corail broaching system going up to a size 10.  With the size 10 in place, we trialed a standard offset femoral neck and the 36 -2 hip ball given the fact that she was significantly short to start off with the knee flexion contracture knowing that she was going to be tight, we reduced this in the acetabulum and we did increase her leg length under fluoro.  Her offset was better and her range of motion was stable on exam, she was very tight.  We then dislocated the hip and removed the trial components.  We were able to place the real Corail femoral component with standard offset, size 10 and the real 36 -2 metal hip ball.  We reduced this in the acetabulum and again it was stable.  We then irrigated the soft tissue with normal saline solution using pulsatile lavage.  We closed the joint capsule with interrupted #1 Ethibond suture followed by running #1 Vicryl in the tensor fascia, 0 Vicryl in the deep tissue, 2-0 Vicryl in the subcutaneous tissue, 4-0 Monocryl subcuticular stitch and Steri-Strips on the skin.  An Aquacel dressing was applied.  She was taken off the fracture table, awakened, extubated and taken to the recovery room in stable condition.  All final counts were correct.  There were no complications noted.  Of note, Erskine Emery, PA-C assisted in the entire case.  His assistance was crucial facilitating all aspects of this case.     Lind Guest. Ninfa Linden, M.D.     CYB/MEDQ  D:  08/01/2017  T:  08/01/2017  Job:  [id number]

## 2017-08-02 NOTE — Evaluation (Signed)
Physical Therapy Evaluation Patient Details Name: LAKEISHA WALDROP MRN: [id number] DOB: 07/16/1927 Today's Date: 08/02/2017   History of Present Illness  Pt is an 84 y/o female s/p L THA, direct anterior. PMH including but not limited PVD, a-fib and HTN.  Clinical Impression  Pt presented supine in bed with HOB elevated, awake and willing to participate in therapy session. Prior to admission, pt reported that she ambulated with [facility] or RW and was independent with ADLs. Pt lives alone in a single level home with two steps to enter. Pt will have her daughter 24/7 for two days upon d/c and then intermittently for assistance. Pt currently performing bed mobility with modified independence, transfers with min guard and RW and ambulating within her room with use of RW and min guard. PT will continue to follow pt acutely to progress mobility as tolerated to ensure a safe d/c home. Pt would continue to benefit from skilled physical therapy services at this time while admitted and after d/c to address the below listed limitations in order to improve overall safety and independence with functional mobility.     Follow Up Recommendations Home health PT;Supervision - Intermittent    Equipment Recommendations  None recommended by PT    Recommendations for Other Services       Precautions / Restrictions Precautions Precautions: Fall Restrictions Weight Bearing Restrictions: Yes LLE Weight Bearing: Weight bearing as tolerated      Mobility  Bed Mobility Overal bed mobility: Modified Independent             General bed mobility comments: increased time  Transfers Overall transfer level: Needs assistance Equipment used: Rolling walker (2 wheeled) Transfers: Sit to/from Stand Sit to Stand: Min guard         General transfer comment: increased time, cueing for safe hand placement, min guard for safety  Ambulation/Gait Ambulation/Gait assistance: Min guard Ambulation Distance (Feet): 40  Feet Assistive device: Rolling walker (2 wheeled) Gait Pattern/deviations: Step-through pattern;Decreased step length - right;Decreased step length - left;Decreased stride length Gait velocity: decreased Gait velocity interpretation: <1.31 ft/sec, indicative of household ambulator General Gait Details: no instability or LOB, min guard for safety, good technique with RW  Stairs            Wheelchair Mobility    Modified Rankin (Stroke Patients Only)       Balance Overall balance assessment: Needs assistance Sitting-balance support: Feet supported Sitting balance-Leahy Scale: Good     Standing balance support: During functional activity;Bilateral upper extremity supported Standing balance-Leahy Scale: Poor                               Pertinent Vitals/Pain Pain Assessment: No/denies pain    Home Living Family/patient expects to be discharged to:: Private residence Living Arrangements: Alone Available Help at Discharge: Family;Available 24 hours/day;Available PRN/intermittently;Other (Comment)(will have daughter with her for two days) Type of Home: House Home Access: Stairs to enter Entrance Stairs-Rails: Psychiatric nurse of Steps: 2 Home Layout: One level Home Equipment: Walker - 2 wheels;Cane - single point      Prior Function Level of Independence: Independent with assistive device(s)         Comments: pt ambulated with either RW or SPC     Hand Dominance        Extremity/Trunk Assessment   Upper Extremity Assessment Upper Extremity Assessment: Defer to OT evaluation    Lower Extremity Assessment Lower Extremity Assessment:  Generalized weakness;LLE deficits/detail LLE Deficits / Details: pt able to tolerate full WB'ing through L LE; minor decreased strength secondary to post-op       Communication   Communication: No difficulties  Cognition Arousal/Alertness: Awake/alert Behavior During Therapy: WFL for tasks  assessed/performed Overall Cognitive Status: Within Functional Limits for tasks assessed                                        General Comments      Exercises     Assessment/Plan    PT Assessment Patient needs continued PT services  PT Problem List Decreased balance;Decreased mobility;Decreased coordination       PT Treatment Interventions DME instruction;Gait training;Stair training;Functional mobility training;Therapeutic activities;Balance training;Therapeutic exercise;Neuromuscular re-education;Patient/family education    PT Goals (Current goals can be found in the Care Plan section)  Acute Rehab PT Goals Patient Stated Goal: return home PT Goal Formulation: With patient Time For Goal Achievement: 08/16/17 Potential to Achieve Goals: Good    Frequency 7X/week   Barriers to discharge        Co-evaluation               AM-PAC PT "6 Clicks" Daily Activity  Outcome Measure Difficulty turning over in bed (including adjusting bedclothes, sheets and blankets)?: None Difficulty moving from lying on back to sitting on the side of the bed? : None Difficulty sitting down on and standing up from a chair with arms (e.g., wheelchair, bedside commode, etc,.)?: Unable Help needed moving to and from a bed to chair (including a wheelchair)?: None Help needed walking in hospital room?: A Little Help needed climbing 3-5 steps with a railing? : A Little 6 Click Score: 19    End of Session Equipment Utilized During Treatment: Oxygen Activity Tolerance: Patient tolerated treatment well Patient left: in chair;with call bell/phone within reach;Other (comment)(chair alarm pad in place, no box present) Nurse Communication: Mobility status;Other (comment)(chair alarm pad in place, no box present) PT Visit Diagnosis: Other abnormalities of gait and mobility (R26.89)    Time: 9233-0076 PT Time Calculation (min) (ACUTE ONLY): 16 min   Charges:   PT Evaluation $PT  Eval Low Complexity: 1 Low     PT G Codes:        [place], PT, DPT [place] 08/02/2017, 11:08 AM

## 2017-08-02 NOTE — Progress Notes (Signed)
Subjective: 1 Day Post-Op Procedure(s) (LRB): LEFT TOTAL HIP ARTHROPLASTY ANTERIOR APPROACH (Left) Patient reports pain as moderate.  Only slight acute blood loss anemia from surgery; vitals stable.  Objective: Vital signs in last 24 hours: Temp:  [97.6 F (36.4 C)-98.6 F (37 C)] 98.6 F (37 C) (05/01 0518) Pulse Rate:  [64-87] 79 (05/01 0518) Resp:  [16-44] 18 (04/30 1710) BP: (101-177)/(62-105) 128/64 (05/01 0518) SpO2:  [91 %-99 %] 98 % (05/01 0518) Weight:  [111 lb (50.3 kg)-113 lb (51.3 kg)] 111 lb (50.3 kg) (04/30 1218)  Intake/Output from previous day: 04/30 0701 - 05/01 0700 In: 1240 [P.O.:240; I.V.:1000] Out: 200 [Blood:200] Intake/Output this shift: No intake/output data recorded.  Recent Labs    08/02/17 0436  HGB 10.8*   Recent Labs    08/02/17 0436  WBC 6.7  RBC 3.55*  HCT 33.6*  PLT 250   Recent Labs    08/02/17 0436  NA 136  K 5.0  CL 102  CO2 27  BUN 17  CREATININE 0.99  GLUCOSE 159*  CALCIUM 8.8*   Recent Labs    08/01/17 1210  INR 1.06    Sensation intact distally Intact pulses distally Dorsiflexion/Plantar flexion intact Incision: dressing C/D/I  Assessment/Plan: 1 Day Post-Op Procedure(s) (LRB): LEFT TOTAL HIP ARTHROPLASTY ANTERIOR APPROACH (Left) Up with therapy    Mcarthur Rossetti 08/02/2017, 7:14 AM

## 2017-08-02 NOTE — Discharge Instructions (Addendum)
Information on my medicine - ELIQUIS (apixaban)  This medication education was reviewed with me or my healthcare representative as part of my discharge preparation.  The pharmacist that spoke with me during my hospital stay was:  Saundra Shelling, [facility]  Why was Eliquis prescribed for you? Eliquis was prescribed for you to reduce the risk of a blood clot forming that can cause a stroke if you have a medical condition called atrial fibrillation (a type of irregular heartbeat).  What do You need to know about Eliquis ? Take your Eliquis TWICE DAILY - one tablet in the morning and one tablet in the evening with or without food. If you have difficulty swallowing the tablet whole please discuss with your pharmacist how to take the medication safely.  Take Eliquis exactly as prescribed by your doctor and DO NOT stop taking Eliquis without talking to the doctor who prescribed the medication.  Stopping may increase your risk of developing a stroke.  Refill your prescription before you run out.  After discharge, you should have regular check-up appointments with your healthcare provider that is prescribing your Eliquis.  In the future your dose may need to be changed if your kidney function or weight changes by a significant amount or as you get older.  What do you do if you miss a dose? If you miss a dose, take it as soon as you remember on the same day and resume taking twice daily.  Do not take more than one dose of ELIQUIS at the same time to make up a missed dose.  Important Safety Information A possible side effect of Eliquis is bleeding. You should call your healthcare provider right away if you experience any of the following: ? Bleeding from an injury or your nose that does not stop. ? Unusual colored urine (red or dark brown) or unusual colored stools (red or black). ? Unusual bruising for unknown reasons. ? A serious fall or if you hit your head (even if there is no bleeding).  Some  medicines may interact with Eliquis and might increase your risk of bleeding or clotting while on Eliquis. To help avoid this, consult your healthcare provider or pharmacist prior to using any new prescription or non-prescription medications, including herbals, vitamins, non-steroidal anti-inflammatory drugs (NSAIDs) and supplements.  This website has more information on Eliquis (apixaban): http://www.eliquis.com/eliquis/home   INSTRUCTIONS AFTER JOINT REPLACEMENT   o Remove items at home which could result in a fall. This includes throw rugs or furniture in walking pathways o ICE to the affected joint every three hours while awake for 30 minutes at a time, for at least the first 3-5 days, and then as needed for pain and swelling.  Continue to use ice for pain and swelling. You may notice swelling that will progress down to the foot and ankle.  This is normal after surgery.  Elevate your leg when you are not up walking on it.   o Continue to use the breathing machine you got in the hospital (incentive spirometer) which will help keep your temperature down.  It is common for your temperature to cycle up and down following surgery, especially at night when you are not up moving around and exerting yourself.  The breathing machine keeps your lungs expanded and your temperature down.   DIET:  As you were doing prior to hospitalization, we recommend a well-balanced diet.  DRESSING / WOUND CARE / SHOWERING  Keep the surgical dressing until follow up.  The dressing  is water proof, so you can shower without any extra covering.  IF THE DRESSING FALLS OFF or the wound gets wet inside, change the dressing with sterile gauze.  Please use good hand washing techniques before changing the dressing.  Do not use any lotions or creams on the incision until instructed by your surgeon.    ACTIVITY  o Increase activity slowly as tolerated, but follow the weight bearing instructions below.   o No driving for 6  weeks or until further direction given by your physician.  You cannot drive while taking narcotics.  o No lifting or carrying greater than 10 lbs. until further directed by your surgeon. o Avoid periods of inactivity such as sitting longer than an hour when not asleep. This helps prevent blood clots.  o You may return to work once you are authorized by your doctor.     WEIGHT BEARING   Weight bearing as tolerated with assist device (walker, cane, etc) as directed, use it as long as suggested by your surgeon or therapist, typically at least 4-6 weeks.   EXERCISES  Results after joint replacement surgery are often greatly improved when you follow the exercise, range of motion and muscle strengthening exercises prescribed by your doctor. Safety measures are also important to protect the joint from further injury. Any time any of these exercises cause you to have increased pain or swelling, decrease what you are doing until you are comfortable again and then slowly increase them. If you have problems or questions, call your caregiver or physical therapist for advice.   Rehabilitation is important following a joint replacement. After just a few days of immobilization, the muscles of the leg can become weakened and shrink (atrophy).  These exercises are designed to build up the tone and strength of the thigh and leg muscles and to improve motion. Often times heat used for twenty to thirty minutes before working out will loosen up your tissues and help with improving the range of motion but do not use heat for the first two weeks following surgery (sometimes heat can increase post-operative swelling).   These exercises can be done on a training (exercise) mat, on the floor, on a table or on a bed. Use whatever works the best and is most comfortable for you.    Use music or television while you are exercising so that the exercises are a pleasant break in your day. This will make your life better with the  exercises acting as a break in your routine that you can look forward to.   Perform all exercises about fifteen times, three times per day or as directed.  You should exercise both the operative leg and the other leg as well.  Exercises include:    Quad Sets - Tighten up the muscle on the front of the thigh (Quad) and hold for 5-10 seconds.    Straight Leg Raises - With your knee straight (if you were given a brace, keep it on), lift the leg to 60 degrees, hold for 3 seconds, and slowly lower the leg.  Perform this exercise against resistance later as your leg gets stronger.   Leg Slides: Lying on your back, slowly slide your foot toward your buttocks, bending your knee up off the floor (only go as far as is comfortable). Then slowly slide your foot back down until your leg is flat on the floor again.   Angel Wings: Lying on your back spread your legs to the side as far  apart as you can without causing discomfort.   Hamstring Strength:  Lying on your back, push your heel against the floor with your leg straight by tightening up the muscles of your buttocks.  Repeat, but this time bend your knee to a comfortable angle, and push your heel against the floor.  You may put a pillow under the heel to make it more comfortable if necessary.   A rehabilitation program following joint replacement surgery can speed recovery and prevent re-injury in the future due to weakened muscles. Contact your doctor or a physical therapist for more information on knee rehabilitation.    CONSTIPATION  Constipation is defined medically as fewer than three stools per week and severe constipation as less than one stool per week.  Even if you have a regular bowel pattern at home, your normal regimen is likely to be disrupted due to multiple reasons following surgery.  Combination of anesthesia, postoperative narcotics, change in appetite and fluid intake all can affect your bowels.   YOU MUST use at least one of the  following options; they are listed in order of increasing strength to get the job done.  They are all available over the counter, and you may need to use some, POSSIBLY even all of these options:    Drink plenty of fluids (prune juice may be helpful) and high fiber foods Colace 100 mg by mouth twice a day  Senokot for constipation as directed and as needed Dulcolax (bisacodyl), take with full glass of water  Miralax (polyethylene glycol) once or twice a day as needed.  If you have tried all these things and are unable to have a bowel movement in the first 3-4 days after surgery call either your surgeon or your primary doctor.    If you experience loose stools or diarrhea, hold the medications until you stool forms back up.  If your symptoms do not get better within 1 week or if they get worse, check with your doctor.  If you experience "the worst abdominal pain ever" or develop nausea or vomiting, please contact the office immediately for further recommendations for treatment.   ITCHING:  If you experience itching with your medications, try taking only a single pain pill, or even half a pain pill at a time.  You can also use Benadryl over the counter for itching or also to help with sleep.   TED HOSE STOCKINGS:  Use stockings on both legs until for at least 2 weeks or as directed by physician office. They may be removed at night for sleeping.  MEDICATIONS:  See your medication summary on the After Visit Summary that nursing will review with you.  You may have some home medications which will be placed on hold until you complete the course of blood thinner medication.  It is important for you to complete the blood thinner medication as prescribed.  PRECAUTIONS:  If you experience chest pain or shortness of breath - call 911 immediately for transfer to the hospital emergency department.   If you develop a fever greater that 101 F, purulent drainage from wound, increased redness or drainage from  wound, foul odor from the wound/dressing, or calf pain - CONTACT YOUR SURGEON.  FOLLOW-UP APPOINTMENTS:  If you do not already have a post-op appointment, please call the office for an appointment to be seen by your surgeon.  Guidelines for how soon to be seen are listed in your After Visit Summary, but are typically between 1-4 weeks after surgery.  OTHER INSTRUCTIONS:   Knee Replacement:  Do not place pillow under knee, focus on keeping the knee straight while resting. CPM instructions: 0-90 degrees, 2 hours in the morning, 2 hours in the afternoon, and 2 hours in the evening. Place foam block, curve side up under heel at all times except when in CPM or when walking.  DO NOT modify, tear, cut, or change the foam block in any way.  MAKE SURE YOU:   Understand these instructions.   Get help right away if you are not doing well or get worse.    Thank you for letting us be a part of your medical care team.  It is a privilege we respect greatly.  We hope these instructions will help you stay on track for a fast and full recovery!

## 2017-08-02 NOTE — Progress Notes (Signed)
Physical Therapy Treatment Patient Details Name: Jody [name] MRN: [id number] DOB: 03/21/1928 Today's Date: 08/02/2017    History of Present Illness Pt is an 84 y/o female s/p L THA, direct anterior. PMH including but not limited PVD, a-fib and HTN.    PT Comments    Pt making excellent progress with functional mobility and tolerated ambulating a further distance this session. Pt also successfully completed stair training with min guard and cueing for technique. Pt would likely benefit from another session with stair training again tomorrow. Pt would continue to benefit from skilled physical therapy services at this time while admitted and after d/c to address the below listed limitations in order to improve overall safety and independence with functional mobility.    Follow Up Recommendations  Home health PT;Supervision - Intermittent     Equipment Recommendations  None recommended by PT    Recommendations for Other Services       Precautions / Restrictions Precautions Precautions: Fall Restrictions Weight Bearing Restrictions: Yes LLE Weight Bearing: Weight bearing as tolerated    Mobility  Bed Mobility Overal bed mobility: Modified Independent             General bed mobility comments: pt up in recliner upon arrival  Transfers Overall transfer level: Needs assistance Equipment used: Rolling walker (2 wheeled) Transfers: Sit to/from Stand Sit to Stand: Supervision         General transfer comment: increased time, cueing for safe hand placement, supervision for safety  Ambulation/Gait Ambulation/Gait assistance: Min guard Ambulation Distance (Feet): 200 Feet Assistive device: Rolling walker (2 wheeled) Gait Pattern/deviations: Step-through pattern;Decreased step length - right;Decreased step length - left;Decreased stride length;Trunk flexed Gait velocity: decreased Gait velocity interpretation: <1.31 ft/sec, indicative of household ambulator General Gait  Details: pt requiring frequent verbal and tactile cueing to maintain a safe distance from RW; pt steady with ambulation and navigating around obstacles in hallway   Stairs Stairs: Yes Stairs assistance: Min guard Stair Management: Two rails;Step to pattern;Forwards Number of Stairs: 2 General stair comments: cueing for technique, pt ascended with R LE leading and descended with L LE leading   Wheelchair Mobility    Modified Rankin (Stroke Patients Only)       Balance Overall balance assessment: Needs assistance Sitting-balance support: Feet supported Sitting balance-Leahy Scale: Good     Standing balance support: During functional activity;No upper extremity supported Standing balance-Leahy Scale: Fair                              Cognition Arousal/Alertness: Awake/alert Behavior During Therapy: WFL for tasks assessed/performed Overall Cognitive Status: Within Functional Limits for tasks assessed                                        Exercises Total Joint Exercises Heel Slides: AROM;Strengthening;Left;10 reps;Supine Hip ABduction/ADduction: AROM;Strengthening;Left;10 reps;Supine Straight Leg Raises: AROM;Strengthening;Left;10 reps;Supine    General Comments        Pertinent Vitals/Pain Pain Assessment: Faces Pain Score: 3  Faces Pain Scale: Hurts a little bit Pain Location: L hip Pain Descriptors / Indicators: Aching;Sore Pain Intervention(s): Monitored during session;Repositioned    Home Living Family/patient expects to be discharged to:: Private residence Living Arrangements: Alone Available Help at Discharge: Family;Available 24 hours/day;Available PRN/intermittently;Other (Comment) Type of Home: House Home Access: Stairs to enter Entrance Stairs-Rails: Right;Left Home Layout: One  level Home Equipment: Walker - 2 wheels;Cane - single point      Prior Function Level of Independence: Independent with assistive device(s)       Comments: pt ambulated with either RW or SPC   PT Goals (current goals can now be found in the care plan section) Acute Rehab PT Goals Patient Stated Goal: return home PT Goal Formulation: With patient Time For Goal Achievement: 08/16/17 Potential to Achieve Goals: Good Progress towards PT goals: Progressing toward goals    Frequency    7X/week      PT Plan Current plan remains appropriate    Co-evaluation              AM-PAC PT "6 Clicks" Daily Activity  Outcome Measure  Difficulty turning over in bed (including adjusting bedclothes, sheets and blankets)?: None Difficulty moving from lying on back to sitting on the side of the bed? : None Difficulty sitting down on and standing up from a chair with arms (e.g., wheelchair, bedside commode, etc,.)?: Unable Help needed moving to and from a bed to chair (including a wheelchair)?: None Help needed walking in hospital room?: A Little Help needed climbing 3-5 steps with a railing? : A Little 6 Click Score: 19    End of Session Equipment Utilized During Treatment: Gait belt Activity Tolerance: Patient tolerated treatment well Patient left: in bed;with call bell/phone within reach;with SCD's reapplied Nurse Communication: Mobility status PT Visit Diagnosis: Other abnormalities of gait and mobility (R26.89)     Time: 1535-1550 PT Time Calculation (min) (ACUTE ONLY): 15 min  Charges:  $Gait Training: 8-22 mins                    G Codes:       [place], [place], [place] 08/02/2017, 4:44 PM

## 2017-08-02 NOTE — Evaluation (Signed)
Occupational Therapy Evaluation Patient Details Name: Jody [name] MRN: [id number] DOB: 1928/03/18 Today's Date: 08/02/2017    History of Present Illness Pt is an 84 y/o female s/p L THA, direct anterior. PMH including but not limited PVD, a-fib and HTN.   Clinical Impression   Pt with decline in function and safety with ADLs and ADL mobility with decreased balance and endurance. Pt requires min cues for safety during ADL mobility using RW. Pt would benefit from acute OT services to address impairments to maximize level of function and safety    Follow Up Recommendations  No OT follow up;Supervision - Intermittent    Equipment Recommendations  Tub/shower seat;Other (comment)(reacher)    Recommendations for Other Services       Precautions / Restrictions Precautions Precautions: Fall Restrictions Weight Bearing Restrictions: Yes LLE Weight Bearing: Weight bearing as tolerated      Mobility Bed Mobility Overal bed mobility: Modified Independent             General bed mobility comments: pt up in recliner upon arrival  Transfers Overall transfer level: Needs assistance Equipment used: Rolling walker (2 wheeled) Transfers: Sit to/from Stand Sit to Stand: Min guard         General transfer comment: increased time, cueing for safe hand placement, min guard for safety    Balance Overall balance assessment: Needs assistance Sitting-balance support: Feet supported Sitting balance-Leahy Scale: Good     Standing balance support: During functional activity;Bilateral upper extremity supported Standing balance-Leahy Scale: Poor                             ADL either performed or assessed with clinical judgement   ADL Overall ADL's : Needs assistance/impaired Eating/Feeding: Independent;Sitting   Grooming: Wash/dry hands;Wash/dry face;Standing;Min guard   Upper Body Bathing: Set up;Sitting   Lower Body Bathing: Minimal assistance;With caregiver  independent assisting   Upper Body Dressing : Set up;Sitting   Lower Body Dressing: Minimal assistance;With caregiver independent assisting   Toilet Transfer: Min guard;Ambulation;RW;Cueing for safety;With caregiver independent assisting   Toileting- Water quality scientist and Hygiene: Min guard;Sit to/from stand;With caregiver independent assisting   Tub/ Shower Transfer: Min guard;Walk-in shower;Ambulation;Rolling walker;Shower seat;With caregiver independent assisting;Cueing for safety   Functional mobility during ADLs: Min guard;Cueing for safety;Rolling walker       Vision Baseline Vision/History: Wears glasses Wears Glasses: At all times Patient Visual Report: No change from baseline       Perception     Praxis      Pertinent Vitals/Pain Pain Assessment: 0-10 Pain Score: 3  Pain Location: L hip Pain Descriptors / Indicators: Aching;Sore Pain Intervention(s): Monitored during session;Repositioned     Hand Dominance Right   Extremity/Trunk Assessment Upper Extremity Assessment Upper Extremity Assessment: Overall WFL for tasks assessed   Lower Extremity Assessment Lower Extremity Assessment: Defer to PT evaluation LLE Deficits / Details: pt able to tolerate full WB'ing through L LE; minor decreased strength secondary to post-op   Cervical / Trunk Assessment Cervical / Trunk Assessment: Normal   Communication Communication Communication: No difficulties   Cognition Arousal/Alertness: Awake/alert Behavior During Therapy: WFL for tasks assessed/performed Overall Cognitive Status: Within Functional Limits for tasks assessed                                     General Comments       Exercises  Shoulder Instructions      Home Living Family/patient expects to be discharged to:: Private residence Living Arrangements: Alone Available Help at Discharge: Family;Available 24 hours/day;Available PRN/intermittently;Other (Comment) Type of Home:  House Home Access: Stairs to enter [employer] of Steps: 2 Entrance Stairs-Rails: Right;Left Home Layout: One level     Bathroom Shower/Tub: Teacher, early years/pre: Handicapped height     Home Equipment: Environmental consultant - 2 wheels;Cane - single point          Prior Functioning/Environment Level of Independence: Independent with assistive device(s)        Comments: pt ambulated with either RW or SPC        OT Problem List: Decreased activity tolerance;Decreased knowledge of use of DME or AE;Impaired balance (sitting and/or standing);Decreased safety awareness;Pain      OT Treatment/Interventions: Self-care/ADL training;DME and/or AE instruction;Therapeutic activities;Therapeutic exercise;Patient/family education    OT Goals(Current goals can be found in the care plan section) Acute Rehab OT Goals Patient Stated Goal: return home OT Goal Formulation: With patient/family Time For Goal Achievement: 08/16/17 Potential to Achieve Goals: Good ADL Goals Pt Will Perform Grooming: with supervision;with set-up;with caregiver independent in assisting;standing Pt Will Perform Lower Body Bathing: with min guard assist;with caregiver independent in assisting;sit to/from stand Pt Will Perform Lower Body Dressing: with min guard assist;with caregiver independent in assisting;sit to/from stand Pt Will Transfer to Toilet: with supervision;ambulating;regular height toilet Pt Will Perform Toileting - Clothing Manipulation and hygiene: with supervision;with caregiver independent in assisting;sit to/from stand Pt Will Perform Tub/Shower Transfer: with supervision;ambulating;rolling walker;shower seat;3 in 1  OT Frequency: Min 2X/week   Barriers to D/C:    no barriers       Co-evaluation              AM-PAC PT "6 Clicks" Daily Activity     Outcome Measure Help from another person eating meals?: None Help from another person taking care of personal grooming?: A  Little Help from another person toileting, which includes using toliet, bedpan, or urinal?: A Little Help from another person bathing (including washing, rinsing, drying)?: A Little Help from another person to put on and taking off regular upper body clothing?: A Little Help from another person to put on and taking off regular lower body clothing?: A Little 6 Click Score: 19   End of Session Equipment Utilized During Treatment: Gait belt;Rolling walker;Other (comment)(3 in 1)  Activity Tolerance: Patient tolerated treatment well Patient left: in chair;with call bell/phone within reach;with family/visitor present  OT Visit Diagnosis: Other abnormalities of gait and mobility (R26.89);Pain Pain - Right/Left: Left Pain - part of body: Hip                Time: 7824-2353 OT Time Calculation (min): 26 min Charges:  OT General Charges $OT Visit: 1 Visit OT Evaluation $OT Eval Low Complexity: 1 Low OT Treatments $Therapeutic Activity: 8-22 mins G-Codes: OT G-codes **NOT FOR INPATIENT CLASS** Functional Assessment Tool Used: AM-PAC 6 Clicks Daily Activity     Britt Bottom 08/02/2017, 1:20 PM

## 2017-08-02 NOTE — Care Management Note (Addendum)
Case Management Note  Patient Details  Name: Jody [name] MRN: [id number] Date of Birth: February 13, 1928  Subjective/Objective:        s/p L THA             Action/Plan: NCM spoke to pt and offered choice for HH/list provided. Pt agreeable to [facility] for [place]. Will need RW and 3n1 for home. Dtr at home to assist with care.   Will order DME from [facility] on 5/2.   Expected Discharge Date:                  Expected Discharge Plan:  [place]  In-House Referral:  NA  Discharge planning Services  CM Consult  Post Acute Care Choice:  Home Health Choice offered to:  Patient  DME Arranged:  N/A DME Agency:  NA  HH Arranged:  PT [place] Agency:  [facility] (formerly [employer])  Status of Service:  Completed, signed off  If discussed at [employer], dates discussed:    Additional Comments:  Erenest Rasher, RN 08/02/2017, 4:57 PM

## 2017-08-02 NOTE — Progress Notes (Signed)
Orthopedic Tech Progress Note Patient Details:  Jody [name] 15-Jul-1927 [id number]  Patient ID: Jody [name], female   DOB: February 05, 1928, 84 y.o.   MRN: [id number] Pt cant have ohf due to age restrictions  Jody [name] 08/02/2017, 11:09 PM

## 2017-08-03 MED ORDER — TRAMADOL HCL 50 MG PO TABS: 50.0000 mg | ORAL_TABLET | Freq: Four times a day (QID) | ORAL | 0 refills | Status: AC | PRN

## 2017-08-03 NOTE — Progress Notes (Signed)
Physical Therapy Treatment Patient Details Name: Jody [name] MRN: [id number] DOB: [birth year] Today's Date: 08/03/2017    History of Present Illness Pt is an 84 y/o female s/p L THA, direct anterior. PMH including but not limited PVD, a-fib and HTN.    PT Comments    Pt making steady progress and was able to participate in stair training again this session. Plan is for pt to d/c home today and pt is ready for d/c from a PT perspective.    Follow Up Recommendations  Home health PT;Supervision - Intermittent     Equipment Recommendations  None recommended by PT    Recommendations for Other Services       Precautions / Restrictions Precautions Precautions: Fall Restrictions Weight Bearing Restrictions: Yes LLE Weight Bearing: Weight bearing as tolerated    Mobility  Bed Mobility               General bed mobility comments: pt up in recliner upon arrival  Transfers Overall transfer level: Needs assistance Equipment used: Rolling walker (2 wheeled) Transfers: Sit to/from Stand Sit to Stand: Supervision         General transfer comment: increased time, cueing for safe hand placement, supervision for safety  Ambulation/Gait Ambulation/Gait assistance: Min guard Ambulation Distance (Feet): 200 Feet Assistive device: Rolling walker (2 wheeled) Gait Pattern/deviations: Step-through pattern;Decreased step length - right;Decreased step length - left;Decreased stride length;Trunk flexed Gait velocity: decreased Gait velocity interpretation: <1.31 ft/sec, indicative of household ambulator General Gait Details: pt requiring occasional cueing to maintain a safe distance from RW; pt steady with ambulation and navigating around obstacles in hallway   Stairs Stairs: Yes Stairs assistance: Min guard Stair Management: Two rails;Step to pattern;Forwards Number of Stairs: 2 General stair comments: cueing for technique, pt ascended with R LE leading and descended with L LE  leading   Wheelchair Mobility    Modified Rankin (Stroke Patients Only)       Balance Overall balance assessment: Needs assistance Sitting-balance support: Feet supported Sitting balance-Leahy Scale: Good     Standing balance support: During functional activity;No upper extremity supported Standing balance-Leahy Scale: Fair                              Cognition Arousal/Alertness: Awake/alert Behavior During Therapy: WFL for tasks assessed/performed Overall Cognitive Status: Within Functional Limits for tasks assessed                                        Exercises      General Comments        Pertinent Vitals/Pain Pain Assessment: Faces Faces Pain Scale: Hurts little more Pain Location: L hip Pain Descriptors / Indicators: Aching;Sore Pain Intervention(s): Monitored during session;Repositioned    Home Living                      Prior Function            PT Goals (current goals can now be found in the care plan section) Acute Rehab PT Goals PT Goal Formulation: With patient Time For Goal Achievement: 08/16/17 Potential to Achieve Goals: Good Progress towards PT goals: Progressing toward goals    Frequency    7X/week      PT Plan Current plan remains appropriate    Co-evaluation  AM-PAC PT "6 Clicks" Daily Activity  Outcome Measure  Difficulty turning over in bed (including adjusting bedclothes, sheets and blankets)?: None Difficulty moving from lying on back to sitting on the side of the bed? : None Difficulty sitting down on and standing up from a chair with arms (e.g., wheelchair, bedside commode, etc,.)?: Unable Help needed moving to and from a bed to chair (including a wheelchair)?: None Help needed walking in hospital room?: A Little Help needed climbing 3-5 steps with a railing? : A Little 6 Click Score: 19    End of Session Equipment Utilized During Treatment: Gait  belt Activity Tolerance: Patient tolerated treatment well Patient left: in chair;with call bell/phone within reach Nurse Communication: Mobility status PT Visit Diagnosis: Other abnormalities of gait and mobility (R26.89)     Time: 1497-0263 PT Time Calculation (min) (ACUTE ONLY): 14 min  Charges:  $Gait Training: 8-22 mins                    G Codes:       [place], [place], [place] 08/03/2017, 10:34 AM

## 2017-08-03 NOTE — Progress Notes (Signed)
Discharge home. Home discharge instruction given, no question verbalized. HHNeeds addressed by CM.

## 2017-08-03 NOTE — Care Management Note (Signed)
Case Management Note  Patient Details  Name: MALEA SWILLING MRN: [id number] Date of Birth: 06-Mar-1928  Subjective/Objective:   84 yr old female s/p left total hip arthroplasty.                  Action/Plan: Patient was preoperatively setup with [facility], no changes. Will have family support at discharge.    Expected Discharge Date:  08/03/17               Expected Discharge Plan:  [place]  In-House Referral:  NA  Discharge planning Services  CM Consult  Post Acute Care Choice:  Home Health Choice offered to:  Patient  DME Arranged:  N/A DME Agency:  NA  HH Arranged:  PT [place] Agency:  [facility] (formerly [employer])  Status of Service:  Completed, signed off  If discussed at [employer], dates discussed:    Additional Comments:  Ninfa Meeker, RN 08/03/2017, 12:57 PM

## 2017-08-03 NOTE — Progress Notes (Signed)
Subjective: 2 Days Post-Op Procedure(s) (LRB): LEFT TOTAL HIP ARTHROPLASTY ANTERIOR APPROACH (Left) Patient reports pain as mild.  Did very well yesterday with therapy and her mobility.  Objective: Vital signs in last 24 hours: Temp:  [98.3 F (36.8 C)-98.6 F (37 C)] 98.3 F (36.8 C) (05/02 0518) Pulse Rate:  [59-77] 59 (05/02 0518) BP: (119-122)/(48-64) 122/48 (05/02 0518) SpO2:  [93 %-94 %] 93 % (05/02 0518)  Intake/Output from previous day: 05/01 0701 - 05/02 0700 In: 2188.8 [P.O.:360; I.V.:1828.8] Out: -  Intake/Output this shift: No intake/output data recorded.  Recent Labs    08/02/17 0436  HGB 10.8*   Recent Labs    08/02/17 0436  WBC 6.7  RBC 3.55*  HCT 33.6*  PLT 250   Recent Labs    08/02/17 0436  NA 136  K 5.0  CL 102  CO2 27  BUN 17  CREATININE 0.99  GLUCOSE 159*  CALCIUM 8.8*   Recent Labs    08/01/17 1210  INR 1.06    Sensation intact distally Intact pulses distally Dorsiflexion/Plantar flexion intact Incision: dressing C/D/I  Assessment/Plan: 2 Days Post-Op Procedure(s) (LRB): LEFT TOTAL HIP ARTHROPLASTY ANTERIOR APPROACH (Left) Up with therapy Discharge home with home health likely this afternoon.    Mcarthur Rossetti 08/03/2017, 6:56 AM

## 2017-08-03 NOTE — Progress Notes (Signed)
Occupational Therapy Treatment and Discharge Patient Details Name: Jody [name] MRN: [id number] DOB: [birth year] Today's Date: 08/03/2017    History of present illness Pt is an 84 y/o female s/p L THA, direct anterior. PMH including but not limited PVD, a-fib and HTN.   OT comments  This 84 yo female admitted and underwent above presents to acute OT today with tub transfer education completed as well as pt realizing that she needs A for LBB/D. No further OT needs, we will sign off.   Follow Up Recommendations  No OT follow up;Supervision/Assistance - 24 hour(24 hour S/A initially until family feel she is safe for them to leave her alone)    Equipment Recommendations  Tub/shower seat;Other (comment)       Precautions / Restrictions Precautions Precautions: Fall Restrictions Weight Bearing Restrictions: No LLE Weight Bearing: Weight bearing as tolerated       Mobility Bed Mobility               General bed mobility comments: pt up in recliner upon arrival  Transfers Overall transfer level: Needs assistance Equipment used: Rolling walker (2 wheeled) Transfers: Sit to/from Stand Sit to Stand: Supervision                  ADL either performed or assessed with clinical judgement   ADL Overall ADL's : Needs assistance/impaired                     Lower Body Dressing: Minimal assistance;With caregiver independent assisting Lower Body Dressing Details (indicate cue type and reason): dtr will A at home prn Toilet Transfer: Supervision/safety;Ambulation;RW Toilet Transfer Details (indicate cue type and reason): recliner>tub>recliner           General ADL Comments: Pt has tub/shower combo at home with glass doors (but still has shower curtain rod up that she reports she holds onto when stepping into and out of tub). I told her this is not normally safe unless the bar is in studs in the wall. Took pt to where out tub is and showed her a more safe way to  use RW to step into tub (stepping in backwards with good leg first then operated leg)--pt unable to clear good leg up over tub without too much pain in operated leg, pt decided she will just sponge bath for now.      Vision Baseline Vision/History: Wears glasses Wears Glasses: At all times Patient Visual Report: No change from baseline            Cognition Arousal/Alertness: Awake/alert Behavior During Therapy: WFL for tasks assessed/performed Overall Cognitive Status: Within Functional Limits for tasks assessed                                                     Pertinent Vitals/ Pain       Pain Assessment: 0-10 Pain Score: 4  Pain Location: L hip Pain Descriptors / Indicators: Aching;Sore Pain Intervention(s): Monitored during session;Repositioned;Patient requesting pain meds-RN notified         Frequency  Min 2X/week        Progress Toward Goals  OT Goals(current goals can now be found in the care plan section)  Progress towards OT goals: Progressing toward goals     Plan Discharge plan needs to be updated  AM-PAC PT "6 Clicks" Daily Activity     Outcome Measure   Help from another person eating meals?: None Help from another person taking care of personal grooming?: A Little Help from another person toileting, which includes using toliet, bedpan, or urinal?: A Little Help from another person bathing (including washing, rinsing, drying)?: A Little Help from another person to put on and taking off regular upper body clothing?: A Little Help from another person to put on and taking off regular lower body clothing?: A Little 6 Click Score: 19    End of Session Equipment Utilized During Treatment: Rolling walker;Gait belt  OT Visit Diagnosis: Other abnormalities of gait and mobility (R26.89);Pain Pain - Right/Left: Left Pain - part of body: Hip   Activity Tolerance Patient tolerated treatment well   Patient Left in chair;with  call bell/phone within reach;with family/visitor present   Nurse Communication Patient requests pain meds        Time: 1222-1239 OT Time Calculation (min): 17 min  Charges: OT General Charges $OT Visit: 1 Visit OT Treatments $Self Care/Home Management : 8-22 mins  [place], OTR/L [phone] 08/03/2017

## 2017-08-03 NOTE — Discharge Summary (Signed)
Patient ID: Jody [name] MRN: [id number] DOB/AGE: [birth year] 84 y.o.  Admit date: 08/01/2017 Discharge date: 08/03/2017  Admission Diagnoses:  Principal Problem:   Unilateral primary osteoarthritis, left hip Active Problems:   Status post total replacement of left hip   Discharge Diagnoses:  Same  Past Medical History:  Diagnosis Date  . Afib ([place])   . Anemia   . Aortic stenosis    moderate by 07/19/17 echo  . Arthritis   . CAD (coronary artery disease)   . Chronic diastolic heart failure ([place])   . Dyspnea   . HTN (hypertension)   . LBBB (left bundle branch block)   . Mitral regurgitation    Moderate, echo, 07/2012  . Pulmonary hypertension ([place])   . Varicose veins of both lower extremities     Surgeries: Procedure(s): LEFT TOTAL HIP ARTHROPLASTY ANTERIOR APPROACH on 08/01/2017   Consultants:   Discharged Condition: Improved  Hospital Course: Jody [name] is an 84 y.o. female who was admitted 08/01/2017 for operative treatment ofUnilateral primary osteoarthritis, left hip. Patient has severe unremitting pain that affects sleep, daily activities, and work/hobbies. After pre-op clearance the patient was taken to the operating room on 08/01/2017 and underwent  Procedure(s): LEFT TOTAL HIP ARTHROPLASTY ANTERIOR APPROACH.    Patient was given perioperative antibiotics:  Anti-infectives (From admission, onward)   Start     Dose/Rate Route Frequency Ordered Stop   08/01/17 2100  ceFAZolin (ANCEF) IVPB 1 g/50 mL premix     1 g 100 mL/hr over 30 Minutes Intravenous Every 6 hours 08/01/17 1729 08/02/17 0459   08/01/17 1230  ceFAZolin (ANCEF) IVPB 2g/100 mL premix     2 g 200 mL/hr over 30 Minutes Intravenous To [facility]Stay [facility] 07/31/17 1225 08/01/17 1448       Patient was given sequential compression devices, early ambulation, and chemoprophylaxis to prevent DVT.  Patient benefited maximally from hospital stay and there were no complications.    Recent vital  signs:  Patient Vitals for the past 24 hrs:  BP Temp Temp src Pulse SpO2  08/03/17 0518 (!) 122/48 98.3 F (36.8 C) - (!) 59 93 %  08/02/17 1310 119/64 98.6 F (37 C) Oral 77 94 %     Recent laboratory studies:  Recent Labs    08/01/17 1210 08/02/17 0436  WBC  --  6.7  HGB  --  10.8*  HCT  --  33.6*  PLT  --  250  NA  --  136  K  --  5.0  CL  --  102  CO2  --  27  BUN  --  17  CREATININE  --  0.99  GLUCOSE  --  159*  INR 1.06  --   CALCIUM  --  8.8*     Discharge Medications:   Allergies as of 08/03/2017      Reactions   Levaquin [levofloxacin] Other (See Comments)   Patient stated vision became blurry and became weak.      Medication List    TAKE these medications   acetaminophen 500 MG tablet Commonly known as:  TYLENOL Take 500 mg by mouth daily as needed for moderate pain.   apixaban 2.5 MG Tabs tablet Commonly known as:  ELIQUIS Take 1 tablet (2.5 mg total) by mouth 2 (two) times daily.   cyproheptadine 2 MG/5ML syrup Commonly known as:  PERIACTIN Take 5 mLs (2 mg total) by mouth every 8 (eight) hours for 7 days, THEN 10 mLs (4 mg  total) every 8 (eight) hours. Start taking on:  04/10/2017   cyproheptadine 2 MG/5ML syrup Commonly known as:  PERIACTIN   diltiazem 240 MG 24 hr capsule Commonly known as:  CARTIA XT Take 1 capsule (240 mg total) by mouth daily.   ferrous sulfate 325 (65 FE) MG tablet Take 1 tablet (325 mg total) by mouth daily.   furosemide 20 MG tablet Commonly known as:  LASIX Take 1 tablet (20 mg total) by mouth daily as needed for edema.   traMADol 50 MG tablet Commonly known as:  ULTRAM Take 1-2 tablets (50-100 mg total) by mouth every 6 (six) hours as needed. What changed:  when to take this   vitamin B-12 1000 MCG tablet Commonly known as:  CYANOCOBALAMIN Take 1,000 mcg by mouth daily.            Durable Medical Equipment  (From admission, onward)        Start     Ordered   08/01/17 1730  DME 3 n 1  Once      08/01/17 1729   08/01/17 1730  DME Walker rolling  Once    Question:  Patient needs a walker to treat with the following condition  Answer:  Status post total replacement of left hip   08/01/17 1729      Diagnostic Studies: Dg Pelvis Portable  Result Date: 08/01/2017 CLINICAL DATA:  Status post left hip replacement. EXAM: PORTABLE PELVIS 1-2 VIEWS COMPARISON:  C-arm images obtained earlier today. Pelvis and left hip radiographs dated 12/23/2016 FINDINGS: Interval left hip bipolar prosthesis in satisfactory position and alignment. No fracture or dislocation seen. Mild right hip degenerative changes and possible avascular necrosis. IMPRESSION: 1. Satisfactory postoperative appearance of a left hip prosthesis. 2. Mild right hip degenerative changes and possible avascular necrosis. Electronically Signed   By: Claudie Revering M.D.   On: 08/01/2017 18:49   Dg C-arm 1-60 Min  Result Date: 08/01/2017 CLINICAL DATA:  Status post left hip joint prosthesis placement using the anterior approach. Reported fluoro time is 28 seconds. EXAM: OPERATIVE left HIP (WITH PELVIS IF PERFORMED) 3 VIEWS TECHNIQUE: Fluoroscopic spot image(s) were submitted for interpretation post-operatively. COMPARISON:  None. FINDINGS: The patient has undergone anterior approach left hip joint prosthesis placement. Radiographic positioning of the prosthetic components is good. The interface with the native bone is normal. No acute native bone abnormality is observed. IMPRESSION: There is no immediate postprocedure complication following anterior approach left total hip joint prosthesis placement. Electronically Signed   By: David  Martinique M.D.   On: 08/01/2017 16:37   Dg Hip Operative Unilat W Or W/o Pelvis Left  Result Date: 08/01/2017 CLINICAL DATA:  Status post left hip joint prosthesis placement using the anterior approach. Reported fluoro time is 28 seconds. EXAM: OPERATIVE left HIP (WITH PELVIS IF PERFORMED) 3 VIEWS TECHNIQUE:  Fluoroscopic spot image(s) were submitted for interpretation post-operatively. COMPARISON:  None. FINDINGS: The patient has undergone anterior approach left hip joint prosthesis placement. Radiographic positioning of the prosthetic components is good. The interface with the native bone is normal. No acute native bone abnormality is observed. IMPRESSION: There is no immediate postprocedure complication following anterior approach left total hip joint prosthesis placement. Electronically Signed   By: David  Martinique M.D.   On: 08/01/2017 16:37    Disposition: Discharge disposition: 01-Home or [place]    Mcarthur Rossetti, MD Follow up in 2 week(s).   Specialty:  Orthopedic Surgery Contact information: [place] [address] [phone]            Signed: Mcarthur Rossetti 08/03/2017, 6:59 AM

## 2017-08-04 DIAGNOSIS — I2510 Atherosclerotic heart disease of native coronary artery without angina pectoris: Secondary | ICD-10-CM | POA: Diagnosis not present

## 2017-08-04 DIAGNOSIS — I447 Left bundle-branch block, unspecified: Secondary | ICD-10-CM | POA: Diagnosis not present

## 2017-08-04 DIAGNOSIS — I739 Peripheral vascular disease, unspecified: Secondary | ICD-10-CM | POA: Diagnosis not present

## 2017-08-04 DIAGNOSIS — I272 Pulmonary hypertension, unspecified: Secondary | ICD-10-CM | POA: Diagnosis not present

## 2017-08-04 DIAGNOSIS — Z471 Aftercare following joint replacement surgery: Secondary | ICD-10-CM | POA: Diagnosis not present

## 2017-08-04 DIAGNOSIS — M1991 Primary osteoarthritis, unspecified site: Secondary | ICD-10-CM | POA: Diagnosis not present

## 2017-08-04 DIAGNOSIS — I350 Nonrheumatic aortic (valve) stenosis: Secondary | ICD-10-CM | POA: Diagnosis not present

## 2017-08-04 DIAGNOSIS — Z96642 Presence of left artificial hip joint: Secondary | ICD-10-CM | POA: Diagnosis not present

## 2017-08-04 DIAGNOSIS — I4891 Unspecified atrial fibrillation: Secondary | ICD-10-CM | POA: Diagnosis not present

## 2017-08-04 DIAGNOSIS — I110 Hypertensive heart disease with heart failure: Secondary | ICD-10-CM | POA: Diagnosis not present

## 2017-08-04 DIAGNOSIS — Z96653 Presence of artificial knee joint, bilateral: Secondary | ICD-10-CM | POA: Diagnosis not present

## 2017-08-04 DIAGNOSIS — I5032 Chronic diastolic (congestive) heart failure: Secondary | ICD-10-CM | POA: Diagnosis not present

## 2017-08-04 DIAGNOSIS — I051 Rheumatic mitral insufficiency: Secondary | ICD-10-CM | POA: Diagnosis not present

## 2017-08-07 ENCOUNTER — Telehealth (INDEPENDENT_AMBULATORY_CARE_PROVIDER_SITE_OTHER): Payer: Self-pay | Admitting: Orthopaedic Surgery

## 2017-08-07 DIAGNOSIS — I447 Left bundle-branch block, unspecified: Secondary | ICD-10-CM | POA: Diagnosis not present

## 2017-08-07 DIAGNOSIS — I350 Nonrheumatic aortic (valve) stenosis: Secondary | ICD-10-CM | POA: Diagnosis not present

## 2017-08-07 DIAGNOSIS — I5032 Chronic diastolic (congestive) heart failure: Secondary | ICD-10-CM | POA: Diagnosis not present

## 2017-08-07 DIAGNOSIS — Z96642 Presence of left artificial hip joint: Secondary | ICD-10-CM | POA: Diagnosis not present

## 2017-08-07 DIAGNOSIS — Z96653 Presence of artificial knee joint, bilateral: Secondary | ICD-10-CM | POA: Diagnosis not present

## 2017-08-07 DIAGNOSIS — I2510 Atherosclerotic heart disease of native coronary artery without angina pectoris: Secondary | ICD-10-CM | POA: Diagnosis not present

## 2017-08-07 DIAGNOSIS — Z471 Aftercare following joint replacement surgery: Secondary | ICD-10-CM | POA: Diagnosis not present

## 2017-08-07 DIAGNOSIS — I4891 Unspecified atrial fibrillation: Secondary | ICD-10-CM | POA: Diagnosis not present

## 2017-08-07 DIAGNOSIS — M1991 Primary osteoarthritis, unspecified site: Secondary | ICD-10-CM | POA: Diagnosis not present

## 2017-08-07 DIAGNOSIS — I110 Hypertensive heart disease with heart failure: Secondary | ICD-10-CM | POA: Diagnosis not present

## 2017-08-07 DIAGNOSIS — I739 Peripheral vascular disease, unspecified: Secondary | ICD-10-CM | POA: Diagnosis not present

## 2017-08-07 DIAGNOSIS — I051 Rheumatic mitral insufficiency: Secondary | ICD-10-CM | POA: Diagnosis not present

## 2017-08-07 DIAGNOSIS — I272 Pulmonary hypertension, unspecified: Secondary | ICD-10-CM | POA: Diagnosis not present

## 2017-08-07 NOTE — Telephone Encounter (Signed)
Sharyn Lull called asking for verbal orders on home therapy for patient for an additional 3 week 2. CB # [phone]

## 2017-08-07 NOTE — Telephone Encounter (Signed)
Verbal order given  

## 2017-08-09 DIAGNOSIS — M1991 Primary osteoarthritis, unspecified site: Secondary | ICD-10-CM | POA: Diagnosis not present

## 2017-08-09 DIAGNOSIS — Z96653 Presence of artificial knee joint, bilateral: Secondary | ICD-10-CM | POA: Diagnosis not present

## 2017-08-09 DIAGNOSIS — I2510 Atherosclerotic heart disease of native coronary artery without angina pectoris: Secondary | ICD-10-CM | POA: Diagnosis not present

## 2017-08-09 DIAGNOSIS — I110 Hypertensive heart disease with heart failure: Secondary | ICD-10-CM | POA: Diagnosis not present

## 2017-08-09 DIAGNOSIS — I4891 Unspecified atrial fibrillation: Secondary | ICD-10-CM | POA: Diagnosis not present

## 2017-08-09 DIAGNOSIS — I350 Nonrheumatic aortic (valve) stenosis: Secondary | ICD-10-CM | POA: Diagnosis not present

## 2017-08-09 DIAGNOSIS — I051 Rheumatic mitral insufficiency: Secondary | ICD-10-CM | POA: Diagnosis not present

## 2017-08-09 DIAGNOSIS — I5032 Chronic diastolic (congestive) heart failure: Secondary | ICD-10-CM | POA: Diagnosis not present

## 2017-08-09 DIAGNOSIS — I272 Pulmonary hypertension, unspecified: Secondary | ICD-10-CM | POA: Diagnosis not present

## 2017-08-09 DIAGNOSIS — I739 Peripheral vascular disease, unspecified: Secondary | ICD-10-CM | POA: Diagnosis not present

## 2017-08-09 DIAGNOSIS — I447 Left bundle-branch block, unspecified: Secondary | ICD-10-CM | POA: Diagnosis not present

## 2017-08-09 DIAGNOSIS — Z471 Aftercare following joint replacement surgery: Secondary | ICD-10-CM | POA: Diagnosis not present

## 2017-08-09 DIAGNOSIS — Z96642 Presence of left artificial hip joint: Secondary | ICD-10-CM | POA: Diagnosis not present

## 2017-08-11 DIAGNOSIS — Z96653 Presence of artificial knee joint, bilateral: Secondary | ICD-10-CM | POA: Diagnosis not present

## 2017-08-11 DIAGNOSIS — I4891 Unspecified atrial fibrillation: Secondary | ICD-10-CM | POA: Diagnosis not present

## 2017-08-11 DIAGNOSIS — Z471 Aftercare following joint replacement surgery: Secondary | ICD-10-CM | POA: Diagnosis not present

## 2017-08-11 DIAGNOSIS — I051 Rheumatic mitral insufficiency: Secondary | ICD-10-CM | POA: Diagnosis not present

## 2017-08-11 DIAGNOSIS — Z96642 Presence of left artificial hip joint: Secondary | ICD-10-CM | POA: Diagnosis not present

## 2017-08-11 DIAGNOSIS — I447 Left bundle-branch block, unspecified: Secondary | ICD-10-CM | POA: Diagnosis not present

## 2017-08-11 DIAGNOSIS — I350 Nonrheumatic aortic (valve) stenosis: Secondary | ICD-10-CM | POA: Diagnosis not present

## 2017-08-11 DIAGNOSIS — I272 Pulmonary hypertension, unspecified: Secondary | ICD-10-CM | POA: Diagnosis not present

## 2017-08-11 DIAGNOSIS — I5032 Chronic diastolic (congestive) heart failure: Secondary | ICD-10-CM | POA: Diagnosis not present

## 2017-08-11 DIAGNOSIS — I2510 Atherosclerotic heart disease of native coronary artery without angina pectoris: Secondary | ICD-10-CM | POA: Diagnosis not present

## 2017-08-11 DIAGNOSIS — M1991 Primary osteoarthritis, unspecified site: Secondary | ICD-10-CM | POA: Diagnosis not present

## 2017-08-11 DIAGNOSIS — I110 Hypertensive heart disease with heart failure: Secondary | ICD-10-CM | POA: Diagnosis not present

## 2017-08-11 DIAGNOSIS — I739 Peripheral vascular disease, unspecified: Secondary | ICD-10-CM | POA: Diagnosis not present

## 2017-08-14 DIAGNOSIS — Z96653 Presence of artificial knee joint, bilateral: Secondary | ICD-10-CM | POA: Diagnosis not present

## 2017-08-14 DIAGNOSIS — I739 Peripheral vascular disease, unspecified: Secondary | ICD-10-CM | POA: Diagnosis not present

## 2017-08-14 DIAGNOSIS — Z471 Aftercare following joint replacement surgery: Secondary | ICD-10-CM | POA: Diagnosis not present

## 2017-08-14 DIAGNOSIS — I447 Left bundle-branch block, unspecified: Secondary | ICD-10-CM | POA: Diagnosis not present

## 2017-08-14 DIAGNOSIS — M1991 Primary osteoarthritis, unspecified site: Secondary | ICD-10-CM | POA: Diagnosis not present

## 2017-08-14 DIAGNOSIS — Z96642 Presence of left artificial hip joint: Secondary | ICD-10-CM | POA: Diagnosis not present

## 2017-08-14 DIAGNOSIS — I051 Rheumatic mitral insufficiency: Secondary | ICD-10-CM | POA: Diagnosis not present

## 2017-08-14 DIAGNOSIS — I4891 Unspecified atrial fibrillation: Secondary | ICD-10-CM | POA: Diagnosis not present

## 2017-08-14 DIAGNOSIS — I5032 Chronic diastolic (congestive) heart failure: Secondary | ICD-10-CM | POA: Diagnosis not present

## 2017-08-14 DIAGNOSIS — I272 Pulmonary hypertension, unspecified: Secondary | ICD-10-CM | POA: Diagnosis not present

## 2017-08-14 DIAGNOSIS — I110 Hypertensive heart disease with heart failure: Secondary | ICD-10-CM | POA: Diagnosis not present

## 2017-08-14 DIAGNOSIS — I350 Nonrheumatic aortic (valve) stenosis: Secondary | ICD-10-CM | POA: Diagnosis not present

## 2017-08-14 DIAGNOSIS — I2510 Atherosclerotic heart disease of native coronary artery without angina pectoris: Secondary | ICD-10-CM | POA: Diagnosis not present

## 2017-08-15 ENCOUNTER — Encounter (INDEPENDENT_AMBULATORY_CARE_PROVIDER_SITE_OTHER): Payer: Self-pay | Admitting: Orthopaedic Surgery

## 2017-08-15 ENCOUNTER — Ambulatory Visit (INDEPENDENT_AMBULATORY_CARE_PROVIDER_SITE_OTHER): Payer: Medicare Other | Admitting: Orthopaedic Surgery

## 2017-08-15 DIAGNOSIS — Z96642 Presence of left artificial hip joint: Secondary | ICD-10-CM

## 2017-08-15 NOTE — Progress Notes (Signed)
Patient is a very pleasant 84 year old female who is 2 weeks today status post a left total hip arthroplasty.  She is ambulate with a walker.  She says therapy is going very well at home and she has no complaints at all.  On exam her incision looks great.  All the Steri-Strips are in the office there is no significant seroma.  Her leg lengths appear equal.  At this standpoint she continue to increase her activities as comfort allows.  We will see her back in a month to see how things are going overall.  All questions concerns were answered and addressed.

## 2017-08-16 DIAGNOSIS — I051 Rheumatic mitral insufficiency: Secondary | ICD-10-CM | POA: Diagnosis not present

## 2017-08-16 DIAGNOSIS — Z96642 Presence of left artificial hip joint: Secondary | ICD-10-CM | POA: Diagnosis not present

## 2017-08-16 DIAGNOSIS — I4891 Unspecified atrial fibrillation: Secondary | ICD-10-CM | POA: Diagnosis not present

## 2017-08-16 DIAGNOSIS — M1991 Primary osteoarthritis, unspecified site: Secondary | ICD-10-CM | POA: Diagnosis not present

## 2017-08-16 DIAGNOSIS — I350 Nonrheumatic aortic (valve) stenosis: Secondary | ICD-10-CM | POA: Diagnosis not present

## 2017-08-16 DIAGNOSIS — I739 Peripheral vascular disease, unspecified: Secondary | ICD-10-CM | POA: Diagnosis not present

## 2017-08-16 DIAGNOSIS — I5032 Chronic diastolic (congestive) heart failure: Secondary | ICD-10-CM | POA: Diagnosis not present

## 2017-08-16 DIAGNOSIS — I447 Left bundle-branch block, unspecified: Secondary | ICD-10-CM | POA: Diagnosis not present

## 2017-08-16 DIAGNOSIS — I272 Pulmonary hypertension, unspecified: Secondary | ICD-10-CM | POA: Diagnosis not present

## 2017-08-16 DIAGNOSIS — Z471 Aftercare following joint replacement surgery: Secondary | ICD-10-CM | POA: Diagnosis not present

## 2017-08-16 DIAGNOSIS — I110 Hypertensive heart disease with heart failure: Secondary | ICD-10-CM | POA: Diagnosis not present

## 2017-08-16 DIAGNOSIS — I2510 Atherosclerotic heart disease of native coronary artery without angina pectoris: Secondary | ICD-10-CM | POA: Diagnosis not present

## 2017-08-16 DIAGNOSIS — Z96653 Presence of artificial knee joint, bilateral: Secondary | ICD-10-CM | POA: Diagnosis not present

## 2017-08-18 DIAGNOSIS — I5032 Chronic diastolic (congestive) heart failure: Secondary | ICD-10-CM | POA: Diagnosis not present

## 2017-08-18 DIAGNOSIS — Z96653 Presence of artificial knee joint, bilateral: Secondary | ICD-10-CM | POA: Diagnosis not present

## 2017-08-18 DIAGNOSIS — I2510 Atherosclerotic heart disease of native coronary artery without angina pectoris: Secondary | ICD-10-CM | POA: Diagnosis not present

## 2017-08-18 DIAGNOSIS — I350 Nonrheumatic aortic (valve) stenosis: Secondary | ICD-10-CM | POA: Diagnosis not present

## 2017-08-18 DIAGNOSIS — I110 Hypertensive heart disease with heart failure: Secondary | ICD-10-CM | POA: Diagnosis not present

## 2017-08-18 DIAGNOSIS — I4891 Unspecified atrial fibrillation: Secondary | ICD-10-CM | POA: Diagnosis not present

## 2017-08-18 DIAGNOSIS — Z471 Aftercare following joint replacement surgery: Secondary | ICD-10-CM | POA: Diagnosis not present

## 2017-08-18 DIAGNOSIS — I272 Pulmonary hypertension, unspecified: Secondary | ICD-10-CM | POA: Diagnosis not present

## 2017-08-18 DIAGNOSIS — I447 Left bundle-branch block, unspecified: Secondary | ICD-10-CM | POA: Diagnosis not present

## 2017-08-18 DIAGNOSIS — I739 Peripheral vascular disease, unspecified: Secondary | ICD-10-CM | POA: Diagnosis not present

## 2017-08-18 DIAGNOSIS — I051 Rheumatic mitral insufficiency: Secondary | ICD-10-CM | POA: Diagnosis not present

## 2017-08-18 DIAGNOSIS — M1991 Primary osteoarthritis, unspecified site: Secondary | ICD-10-CM | POA: Diagnosis not present

## 2017-08-18 DIAGNOSIS — Z96642 Presence of left artificial hip joint: Secondary | ICD-10-CM | POA: Diagnosis not present

## 2017-08-21 DIAGNOSIS — I272 Pulmonary hypertension, unspecified: Secondary | ICD-10-CM | POA: Diagnosis not present

## 2017-08-21 DIAGNOSIS — I5032 Chronic diastolic (congestive) heart failure: Secondary | ICD-10-CM | POA: Diagnosis not present

## 2017-08-21 DIAGNOSIS — M1991 Primary osteoarthritis, unspecified site: Secondary | ICD-10-CM | POA: Diagnosis not present

## 2017-08-21 DIAGNOSIS — Z96653 Presence of artificial knee joint, bilateral: Secondary | ICD-10-CM | POA: Diagnosis not present

## 2017-08-21 DIAGNOSIS — I739 Peripheral vascular disease, unspecified: Secondary | ICD-10-CM | POA: Diagnosis not present

## 2017-08-21 DIAGNOSIS — I2510 Atherosclerotic heart disease of native coronary artery without angina pectoris: Secondary | ICD-10-CM | POA: Diagnosis not present

## 2017-08-21 DIAGNOSIS — Z471 Aftercare following joint replacement surgery: Secondary | ICD-10-CM | POA: Diagnosis not present

## 2017-08-21 DIAGNOSIS — I4891 Unspecified atrial fibrillation: Secondary | ICD-10-CM | POA: Diagnosis not present

## 2017-08-21 DIAGNOSIS — I350 Nonrheumatic aortic (valve) stenosis: Secondary | ICD-10-CM | POA: Diagnosis not present

## 2017-08-21 DIAGNOSIS — I051 Rheumatic mitral insufficiency: Secondary | ICD-10-CM | POA: Diagnosis not present

## 2017-08-21 DIAGNOSIS — I447 Left bundle-branch block, unspecified: Secondary | ICD-10-CM | POA: Diagnosis not present

## 2017-08-21 DIAGNOSIS — I110 Hypertensive heart disease with heart failure: Secondary | ICD-10-CM | POA: Diagnosis not present

## 2017-08-21 DIAGNOSIS — Z96642 Presence of left artificial hip joint: Secondary | ICD-10-CM | POA: Diagnosis not present

## 2017-08-24 DIAGNOSIS — Z471 Aftercare following joint replacement surgery: Secondary | ICD-10-CM | POA: Diagnosis not present

## 2017-08-24 DIAGNOSIS — I110 Hypertensive heart disease with heart failure: Secondary | ICD-10-CM | POA: Diagnosis not present

## 2017-08-24 DIAGNOSIS — Z96653 Presence of artificial knee joint, bilateral: Secondary | ICD-10-CM | POA: Diagnosis not present

## 2017-08-24 DIAGNOSIS — I272 Pulmonary hypertension, unspecified: Secondary | ICD-10-CM | POA: Diagnosis not present

## 2017-08-24 DIAGNOSIS — I2510 Atherosclerotic heart disease of native coronary artery without angina pectoris: Secondary | ICD-10-CM | POA: Diagnosis not present

## 2017-08-24 DIAGNOSIS — I350 Nonrheumatic aortic (valve) stenosis: Secondary | ICD-10-CM | POA: Diagnosis not present

## 2017-08-24 DIAGNOSIS — I051 Rheumatic mitral insufficiency: Secondary | ICD-10-CM | POA: Diagnosis not present

## 2017-08-24 DIAGNOSIS — I447 Left bundle-branch block, unspecified: Secondary | ICD-10-CM | POA: Diagnosis not present

## 2017-08-24 DIAGNOSIS — I739 Peripheral vascular disease, unspecified: Secondary | ICD-10-CM | POA: Diagnosis not present

## 2017-08-24 DIAGNOSIS — I4891 Unspecified atrial fibrillation: Secondary | ICD-10-CM | POA: Diagnosis not present

## 2017-08-24 DIAGNOSIS — M1991 Primary osteoarthritis, unspecified site: Secondary | ICD-10-CM | POA: Diagnosis not present

## 2017-08-24 DIAGNOSIS — I5032 Chronic diastolic (congestive) heart failure: Secondary | ICD-10-CM | POA: Diagnosis not present

## 2017-08-24 DIAGNOSIS — Z96642 Presence of left artificial hip joint: Secondary | ICD-10-CM | POA: Diagnosis not present

## 2017-08-29 NOTE — Progress Notes (Signed)
[facility] Clinic Note  08/30/2017     CHIEF COMPLAINT Patient presents for Retina Evaluation   HISTORY OF PRESENT ILLNESS: Jody [name] is a 84 y.o. female who presents to the clinic today for:   HPI    Retina Evaluation    In left eye.  This started 2 weeks ago.  Associated Symptoms Distortion.  Negative for Flashes, Pain, Photophobia, Trauma, Jaw Claudication, Fever, Fatigue, Weight Loss, Shoulder/Hip pain, Scalp Tenderness, Glare, Redness, Blind Spot and Floaters.  Context:  distance vision.  Treatments tried include no treatments.  I, the attending physician,  performed the HPI with the patient and updated documentation appropriately.          Comments    Pt presents on the referral of Dr. Milagros Reap for ARMD evaluation; Pt states she sees Dr. Marin Comment for routine care; Pt states she went to "get some new glasses" and went to Dr. Marin Comment due to Story being decreases; Pt states Dr. Marin Comment told her that she had "some different looking blood vessels in my left eye"; Pt states she noted change in OS VA x 2 weeks ago; Pt states OU are comfortable; Pt denies floaters, denies flashes, denies wavy VA; Pt reports she had cataract sx OU x 30+ years ago in [place], [place]; Pt is unsure of surgeon name; Pt denies any other ocular surgeries or ocular dx as a child; Pt denies using gtts, denies taking ocular vits;        Last edited by Bernarda Caffey, MD on 08/30/2017  2:38 PM. (History)      Referring physician: Harlen Labs, MD [address] [place], [place] [address]  HISTORICAL INFORMATION:   Selected notes from the MEDICAL RECORD NUMBER Referred by Dr. Milagros Reap for concern of ARMD OU LEE- 05.21.19 (Y. Le) [BCVA OD: 20/20 OS: 20/60] Ocular Hx-  PMH- A-fib, CAD, HTN    CURRENT MEDICATIONS: No current outpatient medications on file. (Ophthalmic Drugs)   No current facility-administered medications for this visit.  (Ophthalmic Drugs)   Current Outpatient Medications (Other)   Medication Sig  . acetaminophen (TYLENOL) 500 MG tablet Take 500 mg by mouth daily as needed for moderate pain.  Marland Kitchen apixaban (ELIQUIS) 2.5 MG TABS tablet Take 1 tablet (2.5 mg total) by mouth 2 (two) times daily.  . cyproheptadine (PERIACTIN) 2 MG/5ML syrup Take 5 mLs (2 mg total) by mouth every 8 (eight) hours for 7 days, THEN 10 mLs (4 mg total) every 8 (eight) hours. (Patient not taking: Reported on 07/19/2017)  . cyproheptadine (PERIACTIN) 2 MG/5ML syrup   . diltiazem (CARTIA XT) 240 MG 24 hr capsule Take 1 capsule (240 mg total) by mouth daily.  . ferrous sulfate 325 (65 FE) MG tablet Take 1 tablet (325 mg total) by mouth daily.  . furosemide (LASIX) 20 MG tablet Take 1 tablet (20 mg total) by mouth daily as needed for edema.  . traMADol (ULTRAM) 50 MG tablet Take 1-2 tablets (50-100 mg total) by mouth every 6 (six) hours as needed.  . vitamin B-12 (CYANOCOBALAMIN) 1000 MCG tablet Take 1,000 mcg by mouth daily.   No current facility-administered medications for this visit.  (Other)      REVIEW OF SYSTEMS: ROS    Positive for: Cardiovascular, Eyes, Respiratory   Negative for: Constitutional, Gastrointestinal, Neurological, Skin, Genitourinary, Musculoskeletal, HENT, Endocrine, Psychiatric, Allergic/Imm, Heme/Lymph   Last edited by Cherrie Gauze, COA on 08/30/2017  1:48 PM. (History)  ALLERGIES Allergies  Allergen Reactions  . Levaquin [Levofloxacin] Other (See Comments)    Patient stated vision became blurry and became weak.    PAST MEDICAL HISTORY Past Medical History:  Diagnosis Date  . Afib ([place])   . Anemia   . Aortic stenosis    moderate by 07/19/17 echo  . Arthritis   . CAD (coronary artery disease)   . Chronic diastolic heart failure ([place])   . Dyspnea   . HTN (hypertension)   . LBBB (left bundle branch block)   . Mitral regurgitation    Moderate, echo, 07/2012  . Pulmonary hypertension ([place])   . Varicose veins of both lower extremities    Past  Surgical History:  Procedure Laterality Date  . CATARACT EXTRACTION Bilateral   . JOINT REPLACEMENT Right Feb 2015   Dr. Case- [facility] Noss  . TOTAL HIP ARTHROPLASTY Left 08/01/2017   Procedure: LEFT TOTAL HIP ARTHROPLASTY ANTERIOR APPROACH;  Surgeon: Mcarthur Rossetti, MD;  Location: [facility];  Service: Orthopedics;  Laterality: Left;  . TOTAL KNEE ARTHROPLASTY Left   . varicose vein laser ablation Bilateral     FAMILY HISTORY Family History  Problem Relation Age of Onset  . Diabetes Mother   . Amblyopia Neg Hx   . Blindness Neg Hx   . Cataracts Neg Hx   . Glaucoma Neg Hx   . Macular degeneration Neg Hx   . Retinal detachment Neg Hx   . Retinitis pigmentosa Neg Hx     SOCIAL HISTORY Social History   Tobacco Use  . Smoking status: Never Smoker  . Smokeless tobacco: Never Used  Substance Use Topics  . Alcohol use: No  . Drug use: No         OPHTHALMIC EXAM:  Base Eye Exam    Visual Acuity (Snellen - Linear)      Right Left   Dist cc 20/30 20/80   Dist ph cc 20/20 NI   Correction:  Glasses  Questionable effort       Tonometry (Tonopen, 2:02 PM)      Right Left   Pressure 10 09       Pupils      Dark Light Shape React APD   Right 3 2 Round Brisk None   Left 3 2 Round Brisk None       Visual Fields (Counting fingers)      Left Right    Full Full       Extraocular Movement      Right Left    Full, Ortho Full, Ortho       Neuro/Psych    Oriented x3:  Yes   Mood/Affect:  Normal       Dilation    Both eyes:  1.0% Mydriacyl, 2.5% Phenylephrine @ 2:02 PM        Slit Lamp and Fundus Exam    Slit Lamp Exam      Right Left   Lids/Lashes Dermatochalasis - upper lid Dermatochalasis - upper lid   Conjunctiva/Sclera White and quiet White and quiet   Cornea Arcus, crocodile shagreen, dry tear film, decreased TBUT, 2+ Punctate epithelial erosions Arcus, crocodile shagreen, dry tear film, decreased TBUT, 2+ Punctate epithelial erosions   Anterior Chamber  Deep and quiet Deep and quiet   Iris Round and dilated Round and dilated   Lens PC IOL in good position PC IOL in good position   Vitreous Vitreous syneresis, Posterior vitreous detachment Vitreous syneresis, Posterior vitreous detachment  Fundus Exam      Right Left   Disc Pink and Sharp Pink and Sharp   C/D Ratio 0.3 0.3   Macula Good foveal reflex, RPE mottling and clumping, No heme or edema, Drusen Blunted foveal reflex, +central CNVM with sub-retinal hemorrhage, Drusen, RPE mottling and clumping, +edema / SRF inferior to fovea   Vessels Mild Vascular attenuation, mild AV crossing changes Mild Vascular attenuation, mild AV crossing changes   Periphery Attached, Reticular degeneration Attached, Choroidal nevus at 0600 1.5 DA, Reticular degeneration        Refraction    Wearing Rx      Sphere Cylinder Axis Add   Right +1.25 +0.50 006 +2.75   Left +1.50 +0.50 025 +2.75   Age:  62yrs   Type:  PAL       Manifest Refraction      Sphere Cylinder Axis Dist VA   Right +1.00 +0.75 006 20/20   Left +1.75 +0.75 025 20/80 (NI)          IMAGING AND PROCEDURES  Imaging and Procedures for @TODAY @  OCT, Retina - OU - Both Eyes       Right Eye Quality was good. Central Foveal Thickness: 259. Progression has no prior data. Findings include normal foveal contour, no IRF, no SRF, retinal drusen  (Mild Patchy ORA).   Left Eye Quality was good. Central Foveal Thickness: 485. Progression has no prior data. Findings include abnormal foveal contour, subretinal hyper-reflective material, subretinal fluid, intraretinal fluid (Sub-foveal hyper-reflective material pocket of SRF inferior macula).   Notes *Images captured and stored on drive  Diagnosis / Impression:  Non-Exudative ARMD OD Exudative ARMD OS  Clinical management:  See below  Abbreviations: NFP - Normal foveal profile. CME - cystoid macular edema. PED - pigment epithelial detachment. IRF - intraretinal fluid. SRF -  subretinal fluid. EZ - ellipsoid zone. ERM - epiretinal membrane. ORA - outer retinal atrophy. ORT - outer retinal tubulation. SRHM - subretinal hyper-reflective material         Intravitreal Injection, Pharmacologic Agent - OS - Left Eye       Time Out 08/30/2017. 3:32 PM. Confirmed correct patient, procedure, site, and patient consented.   Anesthesia Topical anesthesia was used. Anesthetic medications included Tetracaine 0.5%, Lidocaine 2%.   Procedure Preparation included 5% betadine to ocular surface, eyelid speculum. A supplied needle was used.   Injection: 1.25 mg Bevacizumab 1.25mg /0.21ml   NDC: 09407-680-88    Lot: [id number]    Expiration Date: 10/25/2017   Route: Intravitreal   Site: Left Eye   Waste: 0 mg  Post-op Post injection exam found visual acuity of at least counting fingers. The patient tolerated the procedure well. There were no complications. The patient received written and verbal post procedure care education.                 ASSESSMENT/PLAN:    ICD-10-CM   1. Exudative age-related macular degeneration of left eye with active choroidal neovascularization (HCC) H35.3221 Intravitreal Injection, Pharmacologic Agent - OS - Left Eye  2. Retinal edema H35.81 OCT, Retina - OU - Both Eyes  3. Intermediate stage nonexudative age-related macular degeneration of right eye H35.3112   4. Nevus of choroid of left eye D31.32   5. Posterior vitreous detachment of both eyes H43.813   6. Pseudophakia of both eyes Z96.1     1,2. Exudative age related macular degeneration, OS   - The incidence pathology and anatomy of wet AMD discussed   -  The ANCHOR, MARINA, CATT and VIEW trials discussed with patient.    - discussed treatment options including observation vs intravitreal anti-VEGF agents such as Avastin, Lucentis, Eylea.    - Risks of endophthalmitis and vascular occlusive events and atrophic changes discussed with patient  - OCT with active CNVM and +IRF/SRF  -  BCVA 20/80  - recommend IVA #1 OS today (05.29.19)  - pt wishes to be treated with IVA  - RBA of procedure discussed, questions answered  - informed consent obtained and signed  - see procedure note  - f/u in 4 wks, DFE, OCT, possible injxn   3. Age related macular degeneration, non-exudative, OD  - The incidence, anatomy, and pathology of dry AMD, risk of progression, and the AREDS and AREDS 2 study including smoking risks discussed with patient.  - Recommend amsler grid monitoring  4. Choroidal Nevus OS-  - small flat pigmented choroidal nevus at 0600 - no subretinal fluid, symptoms, orange pigment or drusen - monitor  5.  PVD / vitreous syneresis  Discussed findings and prognosis  No RT or RD on 360 peripheral exam  Reviewed s/s of RT/RD  Strict return precautions for any such RT/RD signs/symptoms  6. Pseudophakia OU  - s/p CE/IOL OU  - doing well  - monitor   Ophthalmic Meds Ordered this visit:  No orders of the defined types were placed in this encounter.      Return in about 1 month (around 09/27/2017) for F/U Exu AMD OS, DFE, OCT.  There are no Patient Instructions on file for this visit.   Explained the diagnoses, plan, and follow up with the patient and they expressed understanding.  Patient expressed understanding of the importance of proper follow up care.   This document serves as a record of services personally performed by Gardiner Sleeper, MD, PhD. It was created on their behalf by Catha Brow, [place], a certified ophthalmic assistant. The creation of this record is the provider's dictation and/or activities during the visit.  Electronically signed by: Catha Brow, [place]  05.28.19 12:38 PM    Gardiner Sleeper, M.D., Ph.D. Diseases & Surgery of the Retina and [place] 05.29.19  I have reviewed the above documentation for accuracy and completeness, and I agree with the above. Gardiner Sleeper, M.D., Ph.D. 08/31/17  12:42 PM     Abbreviations: M myopia (nearsighted); A astigmatism; H hyperopia (farsighted); P presbyopia; Mrx spectacle prescription;  CTL contact lenses; OD right eye; OS left eye; OU both eyes  XT exotropia; ET esotropia; PEK punctate epithelial keratitis; PEE punctate epithelial erosions; DES dry eye syndrome; MGD meibomian gland dysfunction; ATs artificial tears; PFAT's preservative free artificial tears; [place] nuclear sclerotic cataract; PSC posterior subcapsular cataract; ERM epi-retinal membrane; PVD posterior vitreous detachment; RD retinal detachment; DM diabetes mellitus; DR diabetic retinopathy; NPDR non-proliferative diabetic retinopathy; PDR proliferative diabetic retinopathy; CSME clinically significant macular edema; DME diabetic macular edema; dbh dot blot hemorrhages; CWS cotton wool spot; POAG primary open angle glaucoma; C/D cup-to-disc ratio; HVF humphrey visual field; GVF goldmann visual field; OCT optical coherence tomography; IOP intraocular pressure; BRVO Branch retinal vein occlusion; CRVO central retinal vein occlusion; CRAO central retinal artery occlusion; BRAO branch retinal artery occlusion; RT retinal tear; SB scleral buckle; PPV pars plana vitrectomy; VH Vitreous hemorrhage; PRP panretinal laser photocoagulation; IVK intravitreal kenalog; VMT vitreomacular traction; MH Macular hole;  NVD neovascularization of the disc; NVE neovascularization elsewhere; AREDS age related eye disease study; ARMD age  related macular degeneration; POAG primary open angle glaucoma; EBMD epithelial/anterior basement membrane dystrophy; ACIOL anterior chamber intraocular lens; IOL intraocular lens; PCIOL posterior chamber intraocular lens; Phaco/IOL phacoemulsification with intraocular lens placement; [place] photorefractive keratectomy; LASIK laser assisted in situ keratomileusis; HTN hypertension; DM diabetes mellitus; COPD chronic obstructive pulmonary disease

## 2017-08-30 ENCOUNTER — Encounter (INDEPENDENT_AMBULATORY_CARE_PROVIDER_SITE_OTHER): Payer: Self-pay | Admitting: Ophthalmology

## 2017-08-30 ENCOUNTER — Ambulatory Visit (INDEPENDENT_AMBULATORY_CARE_PROVIDER_SITE_OTHER): Payer: Medicare Other | Admitting: Ophthalmology

## 2017-08-30 DIAGNOSIS — H43813 Vitreous degeneration, bilateral: Secondary | ICD-10-CM

## 2017-08-30 DIAGNOSIS — H3581 Retinal edema: Secondary | ICD-10-CM | POA: Diagnosis not present

## 2017-08-30 DIAGNOSIS — D3132 Benign neoplasm of left choroid: Secondary | ICD-10-CM | POA: Diagnosis not present

## 2017-08-30 DIAGNOSIS — H353112 Nonexudative age-related macular degeneration, right eye, intermediate dry stage: Secondary | ICD-10-CM | POA: Diagnosis not present

## 2017-08-30 DIAGNOSIS — H353221 Exudative age-related macular degeneration, left eye, with active choroidal neovascularization: Secondary | ICD-10-CM

## 2017-08-30 DIAGNOSIS — Z961 Presence of intraocular lens: Secondary | ICD-10-CM | POA: Diagnosis not present

## 2017-08-31 ENCOUNTER — Encounter (INDEPENDENT_AMBULATORY_CARE_PROVIDER_SITE_OTHER): Payer: Self-pay | Admitting: Ophthalmology

## 2017-08-31 DIAGNOSIS — H353221 Exudative age-related macular degeneration, left eye, with active choroidal neovascularization: Secondary | ICD-10-CM | POA: Diagnosis not present

## 2017-08-31 DIAGNOSIS — H43813 Vitreous degeneration, bilateral: Secondary | ICD-10-CM | POA: Diagnosis not present

## 2017-08-31 DIAGNOSIS — H3581 Retinal edema: Secondary | ICD-10-CM | POA: Diagnosis not present

## 2017-08-31 MED ORDER — BEVACIZUMAB CHEMO INJECTION 1.25MG/0.05ML SYRINGE FOR KALEIDOSCOPE
1.2500 mg | INTRAVITREAL | Status: AC
  Administered 2017-08-31: 1.25 mg via INTRAVITREAL

## 2017-09-20 ENCOUNTER — Ambulatory Visit (INDEPENDENT_AMBULATORY_CARE_PROVIDER_SITE_OTHER): Payer: Medicare Other | Admitting: Orthopaedic Surgery

## 2017-09-20 ENCOUNTER — Encounter (INDEPENDENT_AMBULATORY_CARE_PROVIDER_SITE_OTHER): Payer: Self-pay | Admitting: Orthopaedic Surgery

## 2017-09-20 DIAGNOSIS — Z96642 Presence of left artificial hip joint: Secondary | ICD-10-CM

## 2017-09-20 NOTE — Progress Notes (Signed)
The patient is a 84 year old who is now about 6 weeks status post a left total hip arthroplasty.  She is ambulate with a cane and denies any significant pain at all.  She wants to be off her cane though.  She says she feels much better than she was before surgery.  She states that the left knee pain she had is gone.  On exam I can easily put her left hip and left knee through range of motion is full without any pain at all.  Her leg lengths are equal.  At this point she will continue increase her activities as comfort allows.  She may still need to the cane for balance and coordination and I want her to use good reasoning and judgment when deciding to go without the cane.  I do not need to see her back for 6 months.  At that visit I like a low AP pelvis and lateral of her left operative hip.

## 2017-09-26 NOTE — Progress Notes (Signed)
[facility] Note  09/27/2017     CHIEF COMPLAINT Patient presents for Retina Follow Up   HISTORY OF PRESENT ILLNESS: Jody [name] is a 84 y.o. female who presents to the clinic today for:   HPI    Retina Follow Up    Patient presents with  Wet AMD.  In left eye.  Since onset it is gradually improving.  I, the attending physician,  performed the HPI with the patient and updated documentation appropriately.          Comments    F/U EXU AMD OS. Patient states she has less blurriness and her vision is gradually getting better,she ready for tx today if indicated.       Last edited by Bernarda Caffey, MD on 10/01/2017  1:52 AM. (History)      Referring physician: Sharion Balloon, [address], [place] [address]  HISTORICAL INFORMATION:   Selected notes from the MEDICAL RECORD NUMBER Referred by Dr. Milagros Reap for concern of ARMD OU LEE- 05.21.19 (Y. Le) [BCVA OD: 20/20 OS: 20/60] Ocular Hx-  PMH- A-fib, CAD, HTN    CURRENT MEDICATIONS: No current outpatient medications on file. (Ophthalmic Drugs)   No current facility-administered medications for this visit.  (Ophthalmic Drugs)   Current Outpatient Medications (Other)  Medication Sig  . acetaminophen (TYLENOL) 500 MG tablet Take 500 mg by mouth daily as needed for moderate pain.  Marland Kitchen apixaban (ELIQUIS) 2.5 MG TABS tablet Take 1 tablet (2.5 mg total) by mouth 2 (two) times daily.  . cyproheptadine (PERIACTIN) 2 MG/5ML syrup   . diltiazem (CARTIA XT) 240 MG 24 hr capsule Take 1 capsule (240 mg total) by mouth daily.  . ferrous sulfate 325 (65 FE) MG tablet Take 1 tablet (325 mg total) by mouth daily.  . furosemide (LASIX) 20 MG tablet Take 1 tablet (20 mg total) by mouth daily as needed for edema.  . traMADol (ULTRAM) 50 MG tablet Take 1-2 tablets (50-100 mg total) by mouth every 6 (six) hours as needed.  . vitamin B-12 (CYANOCOBALAMIN) 1000 MCG tablet Take 1,000 mcg by mouth daily.   . cyproheptadine (PERIACTIN) 2 MG/5ML syrup Take 5 mLs (2 mg total) by mouth every 8 (eight) hours for 7 days, THEN 10 mLs (4 mg total) every 8 (eight) hours. (Patient not taking: Reported on 07/19/2017)   Current Facility-Administered Medications (Other)  Medication Route  . Bevacizumab (AVASTIN) SOLN 1.25 mg Intravitreal  . Bevacizumab (AVASTIN) SOLN 1.25 mg Intravitreal      REVIEW OF SYSTEMS: ROS    Positive for: Eyes   Negative for: Constitutional, Gastrointestinal, Neurological, Skin, Genitourinary, Musculoskeletal, HENT, Endocrine, Cardiovascular, Respiratory, Psychiatric, Allergic/Imm, Heme/Lymph   Last edited by Zenovia Jordan, LPN on 9/67/8938  1:01 AM. (History)       ALLERGIES Allergies  Allergen Reactions  . Levaquin [Levofloxacin] Other (See Comments)    Patient stated vision became blurry and became weak.    PAST MEDICAL HISTORY Past Medical History:  Diagnosis Date  . Afib ([place])   . Anemia   . Aortic stenosis    moderate by 07/19/17 echo  . Arthritis   . CAD (coronary artery disease)   . Chronic diastolic heart failure ([place])   . Dyspnea   . HTN (hypertension)   . LBBB (left bundle branch block)   . Mitral regurgitation    Moderate, echo, 07/2012  . Pulmonary hypertension ([place])   . Varicose veins of both  lower extremities    Past Surgical History:  Procedure Laterality Date  . CATARACT EXTRACTION Bilateral   . JOINT REPLACEMENT Right Feb 2015   Dr. Case- Ledell Noss  . TOTAL HIP ARTHROPLASTY Left 08/01/2017   Procedure: LEFT TOTAL HIP ARTHROPLASTY ANTERIOR APPROACH;  Surgeon: Mcarthur Rossetti, MD;  Location: [facility];  Service: Orthopedics;  Laterality: Left;  . TOTAL KNEE ARTHROPLASTY Left   . varicose vein laser ablation Bilateral     FAMILY HISTORY Family History  Problem Relation Age of Onset  . Diabetes Mother   . Amblyopia Neg Hx   . Blindness Neg Hx   . Cataracts Neg Hx   . Glaucoma Neg Hx   . Macular degeneration Neg Hx   . Retinal  detachment Neg Hx   . Retinitis pigmentosa Neg Hx     SOCIAL HISTORY Social History   Tobacco Use  . Smoking status: Never Smoker  . Smokeless tobacco: Never Used  Substance Use Topics  . Alcohol use: No  . Drug use: No         OPHTHALMIC EXAM:  Base Eye Exam    Visual Acuity (Snellen - Linear)      Right Left   Dist cc 20/30 20/70 +2   Dist ph cc NI 20/70   Correction:  Glasses       Tonometry (Tonopen, 9:59 AM)      Right Left   Pressure 17 18       Pupils      Dark Light Shape React APD   Right 3 2 Round Brisk None   Left 3 2 Round Brisk None       Visual Fields (Counting fingers)      Left Right    Full Full       Extraocular Movement      Right Left    Full, Ortho Full, Ortho       Neuro/Psych    Oriented x3:  Yes   Mood/Affect:  Normal       Dilation    Both eyes:  1.0% Mydriacyl, Paremyd @ 10:00 AM        Slit Lamp and Fundus Exam    Slit Lamp Exam      Right Left   Lids/Lashes Dermatochalasis - upper lid Dermatochalasis - upper lid   Conjunctiva/Sclera White and quiet White and quiet   Cornea Arcus, crocodile shagreen, dry tear film, decreased TBUT, 2+ Punctate epithelial erosions Arcus, crocodile shagreen, dry tear film, decreased TBUT, 2+ Punctate epithelial erosions   Anterior Chamber Deep and quiet Deep and quiet   Iris Round and dilated Round and dilated   Lens PC IOL in good position PC IOL in good position   Vitreous Vitreous syneresis, Posterior vitreous detachment Vitreous syneresis, Posterior vitreous detachment       Fundus Exam      Right Left   Disc Pink and Sharp Pink and Sharp   C/D Ratio 0.3 0.3   Macula Good foveal reflex, RPE mottling and clumping, Drusen, No heme or edema Blunted foveal reflex, +central CNVM with sub-retinal hemorrhage - improving, Drusen, RPE mottling and clumping, +edema / SRF inferior to fovea - improving   Vessels Mild Vascular attenuation, mild AV crossing changes, Tortuous Mild Vascular  attenuation, mild AV crossing changes, Tortuous   Periphery Attached, Reticular degeneration Attached, Choroidal nevus at 0600 0.5 DD, Reticular degeneration          IMAGING AND PROCEDURES  Imaging and Procedures for @  TODAY@  OCT, Retina - OU - Both Eyes       Right Eye Quality was good. Central Foveal Thickness: 261. Progression has been stable. Findings include normal foveal contour, no IRF, no SRF, retinal drusen , outer retinal atrophy (Patchy ORA).   Left Eye Quality was good. Central Foveal Thickness: 317. Progression has improved. Findings include abnormal foveal contour, subretinal hyper-reflective material, subretinal fluid, intraretinal fluid (pocket of SRF improved, IRF improved, improved foveal contour).   Notes *Images captured and stored on drive  Diagnosis / Impression:  Non-Exudative ARMD OD Exudative ARMD OS  Clinical management:  See below  Abbreviations: NFP - Normal foveal profile. CME - cystoid macular edema. PED - pigment epithelial detachment. IRF - intraretinal fluid. SRF - subretinal fluid. EZ - ellipsoid zone. ERM - epiretinal membrane. ORA - outer retinal atrophy. ORT - outer retinal tubulation. SRHM - subretinal hyper-reflective material         Intravitreal Injection, Pharmacologic Agent - OS - Left Eye       Time Out 09/27/2017. 10:58 AM. Confirmed correct patient, procedure, site, and patient consented.   Anesthesia Topical anesthesia was used. Anesthetic medications included Tetracaine 0.5%, Lidocaine 2%.   Procedure Preparation included 5% betadine to ocular surface, eyelid speculum. A supplied needle was used.   Injection: 1.25 mg Bevacizumab 1.25mg /0.18ml   NDC: 70360-001-02    Lot: [id number]     Expiration Date: 11/16/2017   Route: Intravitreal   Site: Left Eye   Waste: 0 mg  Post-op Post injection exam found visual acuity of at least counting fingers. The patient tolerated the procedure well. There were no complications.  The patient received written and verbal post procedure care education.                 ASSESSMENT/PLAN:    ICD-10-CM   1. Exudative age-related macular degeneration of left eye with active choroidal neovascularization (HCC) H35.3221 Intravitreal Injection, Pharmacologic Agent - OS - Left Eye    Bevacizumab (AVASTIN) SOLN 1.25 mg  2. Retinal edema H35.81 OCT, Retina - OU - Both Eyes  3. Intermediate stage nonexudative age-related macular degeneration of right eye H35.3112   4. Nevus of choroid of left eye D31.32   5. Posterior vitreous detachment of both eyes H43.813   6. Pseudophakia of both eyes Z96.1     1,2. Exudative age related macular degeneration, OS   - S/P IVA OS #1 (05.29.19)  - OCT with active CNVM and +IRF/SRF  - BCVA improved to 20/70 from 20/80    - good initial response to avastin  - recommend IVA #2 OS today (06.26.19)  - pt wishes to be treated with IVA  - RBA of procedure discussed, questions answered  - informed consent obtained and signed  - see procedure note  - f/u 4 weeks  3. Age related macular degeneration, non-exudative, OD  - The incidence, anatomy, and pathology of dry AMD, risk of progression, and the AREDS and AREDS 2 study including smoking risks discussed with patient.  - Recommend amsler grid monitoring  4. Choroidal Nevus OS-  - small flat pigmented choroidal nevus at 0600 - no subretinal fluid, symptoms, orange pigment or drusen - monitor  5.  PVD / vitreous syneresis  Discussed findings and prognosis  No RT or RD on 360 peripheral exam  Reviewed s/s of RT/RD  Strict return precautions for any such RT/RD signs/symptoms  6. Pseudophakia OU  - s/p CE/IOL OU  - doing well  - monitor  Ophthalmic Meds Ordered this visit:  Meds ordered this encounter  Medications  . Bevacizumab (AVASTIN) SOLN 1.25 mg       Return in about 1 month (around 10/25/2017) for F/U Exu AMD OS, DFE, OCT.  There are no Patient Instructions on file  for this visit.   Explained the diagnoses, plan, and follow up with the patient and they expressed understanding.  Patient expressed understanding of the importance of proper follow up care.   This document serves as a record of services personally performed by Gardiner Sleeper, MD, PhD. It was created on their behalf by Ernest Mallick, OA, an ophthalmic assistant. The creation of this record is the provider's dictation and/or activities during the visit.    Electronically signed by: Ernest Mallick, OA  06.25.2019 1:53 AM   This document serves as a record of services personally performed by Gardiner Sleeper, MD, PhD. It was created on their behalf by Catha Brow, [place], a certified ophthalmic assistant. The creation of this record is the provider's dictation and/or activities during the visit.  Electronically signed by: Catha Brow, COA  06.25.19 1:53 AM    Gardiner Sleeper, M.D., Ph.D. Diseases & Surgery of the Retina and Vitreous [facility]  I have reviewed the above documentation for accuracy and completeness, and I agree with the above. Gardiner Sleeper, M.D., Ph.D. 10/01/17 1:55 AM    Abbreviations: M myopia (nearsighted); A astigmatism; H hyperopia (farsighted); P presbyopia; Mrx spectacle prescription;  CTL contact lenses; OD right eye; OS left eye; OU both eyes  XT exotropia; ET esotropia; PEK punctate epithelial keratitis; PEE punctate epithelial erosions; DES dry eye syndrome; MGD meibomian gland dysfunction; ATs artificial tears; PFAT's preservative free artificial tears; [place] nuclear sclerotic cataract; PSC posterior subcapsular cataract; ERM epi-retinal membrane; PVD posterior vitreous detachment; RD retinal detachment; DM diabetes mellitus; DR diabetic retinopathy; NPDR non-proliferative diabetic retinopathy; PDR proliferative diabetic retinopathy; CSME clinically significant macular edema; DME diabetic macular edema; dbh dot blot hemorrhages; CWS cotton  wool spot; POAG primary open angle glaucoma; C/D cup-to-disc ratio; HVF humphrey visual field; GVF goldmann visual field; OCT optical coherence tomography; IOP intraocular pressure; BRVO Branch retinal vein occlusion; CRVO central retinal vein occlusion; CRAO central retinal artery occlusion; BRAO branch retinal artery occlusion; RT retinal tear; SB scleral buckle; PPV pars plana vitrectomy; VH Vitreous hemorrhage; PRP panretinal laser photocoagulation; IVK intravitreal kenalog; VMT vitreomacular traction; MH Macular hole;  NVD neovascularization of the disc; NVE neovascularization elsewhere; AREDS age related eye disease study; ARMD age related macular degeneration; POAG primary open angle glaucoma; EBMD epithelial/anterior basement membrane dystrophy; ACIOL anterior chamber intraocular lens; IOL intraocular lens; PCIOL posterior chamber intraocular lens; Phaco/IOL phacoemulsification with intraocular lens placement; [place] photorefractive keratectomy; LASIK laser assisted in situ keratomileusis; HTN hypertension; DM diabetes mellitus; COPD chronic obstructive pulmonary disease

## 2017-09-27 ENCOUNTER — Ambulatory Visit (INDEPENDENT_AMBULATORY_CARE_PROVIDER_SITE_OTHER): Payer: Medicare Other | Admitting: Ophthalmology

## 2017-09-27 ENCOUNTER — Encounter (INDEPENDENT_AMBULATORY_CARE_PROVIDER_SITE_OTHER): Payer: Self-pay | Admitting: Ophthalmology

## 2017-09-27 DIAGNOSIS — H353112 Nonexudative age-related macular degeneration, right eye, intermediate dry stage: Secondary | ICD-10-CM

## 2017-09-27 DIAGNOSIS — H353221 Exudative age-related macular degeneration, left eye, with active choroidal neovascularization: Secondary | ICD-10-CM | POA: Diagnosis not present

## 2017-09-27 DIAGNOSIS — D3132 Benign neoplasm of left choroid: Secondary | ICD-10-CM

## 2017-09-27 DIAGNOSIS — H3581 Retinal edema: Secondary | ICD-10-CM

## 2017-09-27 DIAGNOSIS — Z961 Presence of intraocular lens: Secondary | ICD-10-CM

## 2017-09-27 DIAGNOSIS — H43813 Vitreous degeneration, bilateral: Secondary | ICD-10-CM

## 2017-10-01 ENCOUNTER — Encounter (INDEPENDENT_AMBULATORY_CARE_PROVIDER_SITE_OTHER): Payer: Self-pay | Admitting: Ophthalmology

## 2017-10-01 DIAGNOSIS — H353221 Exudative age-related macular degeneration, left eye, with active choroidal neovascularization: Secondary | ICD-10-CM | POA: Diagnosis not present

## 2017-10-01 DIAGNOSIS — H3581 Retinal edema: Secondary | ICD-10-CM | POA: Diagnosis not present

## 2017-10-01 MED ORDER — BEVACIZUMAB CHEMO INJECTION 1.25MG/0.05ML SYRINGE FOR KALEIDOSCOPE
1.2500 mg | INTRAVITREAL | Status: AC
  Administered 2017-10-01: 1.25 mg via INTRAVITREAL

## 2017-10-24 NOTE — Progress Notes (Signed)
[facility] Note  10/25/2017     CHIEF COMPLAINT Patient presents for Retina Follow Up   HISTORY OF PRESENT ILLNESS: Jody [name] is a 84 y.o. female who presents to the clinic today for:   HPI    Retina Follow Up    Patient presents with  Dry AMD.  In left eye.  This started 2 months ago.  Severity is mild.  Since onset it is stable.  I, the attending physician,  performed the HPI with the patient and updated documentation appropriately.          Comments    F/U EXU AMD OS. Patient states her vision is about the same, no new visual onsets. Pt ready for tx today if indicated.       Last edited by Bernarda Caffey, MD on 10/25/2017 12:54 PM. (History)      Referring physician: Sharion Balloon, [address] [place], [place] [address]  HISTORICAL INFORMATION:   Selected notes from the MEDICAL RECORD NUMBER Referred by Dr. Milagros Reap for concern of ARMD OU LEE- 05.21.19 (Y. Le) [BCVA OD: 20/20 OS: 20/60] Ocular Hx-  PMH- A-fib, CAD, HTN    CURRENT MEDICATIONS: No current outpatient medications on file. (Ophthalmic Drugs)   No current facility-administered medications for this visit.  (Ophthalmic Drugs)   Current Outpatient Medications (Other)  Medication Sig  . acetaminophen (TYLENOL) 500 MG tablet Take 500 mg by mouth daily as needed for moderate pain.  Marland Kitchen apixaban (ELIQUIS) 2.5 MG TABS tablet Take 1 tablet (2.5 mg total) by mouth 2 (two) times daily.  . cyproheptadine (PERIACTIN) 2 MG/5ML syrup   . diltiazem (CARTIA XT) 240 MG 24 hr capsule Take 1 capsule (240 mg total) by mouth daily.  . ferrous sulfate 325 (65 FE) MG tablet Take 1 tablet (325 mg total) by mouth daily.  . furosemide (LASIX) 20 MG tablet Take 1 tablet (20 mg total) by mouth daily as needed for edema.  . traMADol (ULTRAM) 50 MG tablet Take 1-2 tablets (50-100 mg total) by mouth every 6 (six) hours as needed.  . vitamin B-12 (CYANOCOBALAMIN) 1000 MCG tablet Take 1,000  mcg by mouth daily.  . cyproheptadine (PERIACTIN) 2 MG/5ML syrup Take 5 mLs (2 mg total) by mouth every 8 (eight) hours for 7 days, THEN 10 mLs (4 mg total) every 8 (eight) hours. (Patient not taking: Reported on 07/19/2017)   Current Facility-Administered Medications (Other)  Medication Route  . Bevacizumab (AVASTIN) SOLN 1.25 mg Intravitreal  . Bevacizumab (AVASTIN) SOLN 1.25 mg Intravitreal  . Bevacizumab (AVASTIN) SOLN 1.25 mg Intravitreal      REVIEW OF SYSTEMS: ROS    Positive for: Eyes   Negative for: Constitutional, Gastrointestinal, Neurological, Skin, Genitourinary, Musculoskeletal, HENT, Endocrine, Cardiovascular, Respiratory, Psychiatric, Allergic/Imm, Heme/Lymph   Last edited by Zenovia Jordan, LPN on 1/88/4166  0:63 AM. (History)       ALLERGIES Allergies  Allergen Reactions  . Levaquin [Levofloxacin] Other (See Comments)    Patient stated vision became blurry and became weak.    PAST MEDICAL HISTORY Past Medical History:  Diagnosis Date  . Afib ([place])   . Anemia   . Aortic stenosis    moderate by 07/19/17 echo  . Arthritis   . CAD (coronary artery disease)   . Chronic diastolic heart failure ([place])   . Dyspnea   . HTN (hypertension)   . LBBB (left bundle branch block)   . Mitral regurgitation  Moderate, echo, 07/2012  . Pulmonary hypertension ([place])   . Varicose veins of both lower extremities    Past Surgical History:  Procedure Laterality Date  . CATARACT EXTRACTION Bilateral   . JOINT REPLACEMENT Right Feb 2015   Dr. Case- Ledell Noss  . TOTAL HIP ARTHROPLASTY Left 08/01/2017   Procedure: LEFT TOTAL HIP ARTHROPLASTY ANTERIOR APPROACH;  Surgeon: Mcarthur Rossetti, MD;  Location: [facility];  Service: Orthopedics;  Laterality: Left;  . TOTAL KNEE ARTHROPLASTY Left   . varicose vein laser ablation Bilateral     FAMILY HISTORY Family History  Problem Relation Age of Onset  . Diabetes Mother   . Amblyopia Neg Hx   . Blindness Neg Hx   . Cataracts Neg  Hx   . Glaucoma Neg Hx   . Macular degeneration Neg Hx   . Retinal detachment Neg Hx   . Retinitis pigmentosa Neg Hx     SOCIAL HISTORY Social History   Tobacco Use  . Smoking status: Never Smoker  . Smokeless tobacco: Never Used  Substance Use Topics  . Alcohol use: No  . Drug use: No         OPHTHALMIC EXAM:  Base Eye Exam    Visual Acuity (Snellen - Linear)      Right Left   Dist cc 20/25 20/60   Dist ph cc NI 20/50   Correction:  Glasses       Tonometry (Tonopen, 9:52 AM)      Right Left   Pressure 16 17       Pupils      Dark Light Shape React APD   Right 4 3 Round Brisk None   Left 4 3 Round Brisk None       Visual Fields (Counting fingers)      Left Right    Full Full       Extraocular Movement      Right Left    Full, Ortho Full, Ortho       Neuro/Psych    Oriented x3:  Yes   Mood/Affect:  Normal       Dilation    Both eyes:  1.0% Mydriacyl, 2.5% Phenylephrine @ 9:53 AM        Slit Lamp and Fundus Exam    Slit Lamp Exam      Right Left   Lids/Lashes Dermatochalasis - upper lid Dermatochalasis - upper lid   Conjunctiva/Sclera White and quiet White and quiet   Cornea Arcus, crocodile shagreen, dry tear film, decreased TBUT, 2+ Punctate epithelial erosions Arcus, crocodile shagreen, dry tear film, decreased TBUT, 2+ Punctate epithelial erosions   Anterior Chamber Deep and quiet Deep and quiet   Iris Round and dilated Round and dilated   Lens PC IOL in good position PC IOL in good position   Vitreous Vitreous syneresis, Posterior vitreous detachment Vitreous syneresis, Posterior vitreous detachment       Fundus Exam      Right Left   Disc Pink and Sharp Pink and Sharp   C/D Ratio 0.3 0.3   Macula Good foveal reflex, RPE mottling and clumping, Drusen, pigment clumping ST to fovea, few MAs, No heme or edema Blunted foveal reflex, +central CNVM with sub-retinal hemorrhage - improving, Drusen, RPE mottling and clumping, +edema / SRF  inferior to fovea - improving   Vessels Mild Vascular attenuation, mild AV crossing changes, Tortuous Mild Vascular attenuation, mild AV crossing changes, Tortuous   Periphery Attached, Reticular degeneration Attached, Choroidal nevus at  0600 0.5 DD, Reticular degeneration          IMAGING AND PROCEDURES  Imaging and Procedures for @TODAY @  OCT, Retina - OU - Both Eyes       Right Eye Quality was good. Central Foveal Thickness: 254. Progression has been stable. Findings include normal foveal contour, no IRF, no SRF, retinal drusen , outer retinal atrophy (Patchy ORA, trace cystic changes).   Left Eye Quality was good. Central Foveal Thickness: 279. Progression has improved. Findings include abnormal foveal contour, subretinal hyper-reflective material, subretinal fluid, no IRF, pigment epithelial detachment (Interval improvement in SRF and foveal contour).   Notes *Images captured and stored on drive  Diagnosis / Impression:  Non-Exudative ARMD OD Exudative ARMD OS -- interval improvement in SRF and foveal contour  Clinical management:  See below  Abbreviations: NFP - Normal foveal profile. CME - cystoid macular edema. PED - pigment epithelial detachment. IRF - intraretinal fluid. SRF - subretinal fluid. EZ - ellipsoid zone. ERM - epiretinal membrane. ORA - outer retinal atrophy. ORT - outer retinal tubulation. SRHM - subretinal hyper-reflective material         Intravitreal Injection, Pharmacologic Agent - OS - Left Eye       Time Out 10/25/2017. 10:54 AM. Confirmed correct patient, procedure, site, and patient consented.   Anesthesia Topical anesthesia was used. Anesthetic medications included Tetracaine 0.5%, Lidocaine 2%.   Procedure Preparation included 5% betadine to ocular surface, eyelid speculum. A 30 gauge needle was used.   Injection: 1.25 mg Bevacizumab 1.25mg /0.28ml   NDC: 82956-213-08    Lot: [id number]     Expiration Date: 01/04/2018   Route:  Intravitreal   Site: Left Eye   Waste: 0 mg  Post-op Post injection exam found visual acuity of at least counting fingers. The patient tolerated the procedure well. There were no complications. The patient received written and verbal post procedure care education.                 ASSESSMENT/PLAN:    ICD-10-CM   1. Exudative age-related macular degeneration of left eye with active choroidal neovascularization (HCC) H35.3221 Intravitreal Injection, Pharmacologic Agent - OS - Left Eye    Bevacizumab (AVASTIN) SOLN 1.25 mg  2. Retinal edema H35.81 OCT, Retina - OU - Both Eyes  3. Intermediate stage nonexudative age-related macular degeneration of right eye H35.3112   4. Nevus of choroid of left eye D31.32   5. Posterior vitreous detachment of both eyes H43.813   6. Pseudophakia of both eyes Z96.1     1,2. Exudative age related macular degeneration, OS   - S/P IVA OS #1 (05.29.19), #2 (06.26.19)  - OCT with active CNVM and +IRF/SRF  - BCVA improved to 20/70 from 20/80    - good response to avastin  - recommend IVA #3 OS today (07.24.19)  - pt wishes to be treated with IVA  - RBA of procedure discussed, questions answered  - informed consent obtained and signed  - see procedure note  - f/u 4 weeks, DFE, OCT  3. Age related macular degeneration, non-exudative, OD  - The incidence, anatomy, and pathology of dry AMD, risk of progression, and the AREDS and AREDS 2 study including smoking risks discussed with patient.  - Recommend amsler grid monitoring  4. Choroidal Nevus OS-  - small flat pigmented choroidal nevus at 0600 - no subretinal fluid, symptoms, orange pigment or drusen - monitor  5.  PVD / vitreous syneresis  Discussed findings and prognosis  No  RT or RD on 360 peripheral exam  Reviewed s/s of RT/RD  Strict return precautions for any such RT/RD signs/symptoms  6. Pseudophakia OU  - s/p CE/IOL OU  - doing well  - monitor   Ophthalmic Meds Ordered this  visit:  Meds ordered this encounter  Medications  . Bevacizumab (AVASTIN) SOLN 1.25 mg       Return in about 1 month (around 11/22/2017) for F/U Exu AMD OS, DFE, OCT.  There are no Patient Instructions on file for this visit.   Explained the diagnoses, plan, and follow up with the patient and they expressed understanding.  Patient expressed understanding of the importance of proper follow up care.   This document serves as a record of services personally performed by Gardiner Sleeper, MD, PhD. It was created on their behalf by Ernest Mallick, OA, an ophthalmic assistant. The creation of this record is the provider's dictation and/or activities during the visit.    Electronically signed by: Ernest Mallick, OA  10/24/2017 12:55 PM   This document serves as a record of services personally performed by Gardiner Sleeper, MD, PhD. It was created on their behalf by Catha Brow, [place], a certified ophthalmic assistant. The creation of this record is the provider's dictation and/or activities during the visit.  Electronically signed by: Catha Brow, [place]  07.24.19 12:55 PM    Gardiner Sleeper, M.D., Ph.D. Diseases & Surgery of the Retina and Vitreous [facility]  I have reviewed the above documentation for accuracy and completeness, and I agree with the above. Gardiner Sleeper, M.D., Ph.D. 10/25/17 12:56 PM   Abbreviations: M myopia (nearsighted); A astigmatism; H hyperopia (farsighted); P presbyopia; Mrx spectacle prescription;  CTL contact lenses; OD right eye; OS left eye; OU both eyes  XT exotropia; ET esotropia; PEK punctate epithelial keratitis; PEE punctate epithelial erosions; DES dry eye syndrome; MGD meibomian gland dysfunction; ATs artificial tears; PFAT's preservative free artificial tears; [place] nuclear sclerotic cataract; PSC posterior subcapsular cataract; ERM epi-retinal membrane; PVD posterior vitreous detachment; RD retinal detachment; DM diabetes mellitus;  DR diabetic retinopathy; NPDR non-proliferative diabetic retinopathy; PDR proliferative diabetic retinopathy; CSME clinically significant macular edema; DME diabetic macular edema; dbh dot blot hemorrhages; CWS cotton wool spot; POAG primary open angle glaucoma; C/D cup-to-disc ratio; HVF humphrey visual field; GVF goldmann visual field; OCT optical coherence tomography; IOP intraocular pressure; BRVO Branch retinal vein occlusion; CRVO central retinal vein occlusion; CRAO central retinal artery occlusion; BRAO branch retinal artery occlusion; RT retinal tear; SB scleral buckle; PPV pars plana vitrectomy; VH Vitreous hemorrhage; PRP panretinal laser photocoagulation; IVK intravitreal kenalog; VMT vitreomacular traction; MH Macular hole;  NVD neovascularization of the disc; NVE neovascularization elsewhere; AREDS age related eye disease study; ARMD age related macular degeneration; POAG primary open angle glaucoma; EBMD epithelial/anterior basement membrane dystrophy; ACIOL anterior chamber intraocular lens; IOL intraocular lens; PCIOL posterior chamber intraocular lens; Phaco/IOL phacoemulsification with intraocular lens placement; [place] photorefractive keratectomy; LASIK laser assisted in situ keratomileusis; HTN hypertension; DM diabetes mellitus; COPD chronic obstructive pulmonary disease

## 2017-10-25 ENCOUNTER — Ambulatory Visit (INDEPENDENT_AMBULATORY_CARE_PROVIDER_SITE_OTHER): Payer: Medicare Other | Admitting: Ophthalmology

## 2017-10-25 ENCOUNTER — Encounter (INDEPENDENT_AMBULATORY_CARE_PROVIDER_SITE_OTHER): Payer: Self-pay | Admitting: Ophthalmology

## 2017-10-25 DIAGNOSIS — Z961 Presence of intraocular lens: Secondary | ICD-10-CM

## 2017-10-25 DIAGNOSIS — H3581 Retinal edema: Secondary | ICD-10-CM | POA: Diagnosis not present

## 2017-10-25 DIAGNOSIS — H353112 Nonexudative age-related macular degeneration, right eye, intermediate dry stage: Secondary | ICD-10-CM

## 2017-10-25 DIAGNOSIS — D3132 Benign neoplasm of left choroid: Secondary | ICD-10-CM

## 2017-10-25 DIAGNOSIS — H353221 Exudative age-related macular degeneration, left eye, with active choroidal neovascularization: Secondary | ICD-10-CM

## 2017-10-25 DIAGNOSIS — H43813 Vitreous degeneration, bilateral: Secondary | ICD-10-CM

## 2017-10-25 MED ORDER — BEVACIZUMAB CHEMO INJECTION 1.25MG/0.05ML SYRINGE FOR KALEIDOSCOPE
1.2500 mg | INTRAVITREAL | Status: AC
  Administered 2017-10-25: 1.25 mg via INTRAVITREAL

## 2017-11-21 NOTE — Progress Notes (Signed)
[facility] Note  11/22/2017     CHIEF COMPLAINT Patient presents for Retina Follow Up   HISTORY OF PRESENT ILLNESS: Jody [name] is a 84 y.o. female who presents to the clinic today for:   HPI    Retina Follow Up    Patient presents with  Wet AMD.  In left eye.  This started 2 months ago.  Severity is mild.  Since onset it is stable.  I, the attending physician,  performed the HPI with the patient and updated documentation appropriately.          Comments    F/U EXU AMD OS. Patient states her vision is " about the same without glasses", " I need new glasses". Denies new visual issues. Denies gtt's. Pt is ready for tx today if indicted.       Last edited by Bernarda Caffey, MD on 11/22/2017 10:58 AM. (History)    Pt states she feels OS VA is improving;   Referring physician: Sharion Balloon, [place], [place] [address]  HISTORICAL INFORMATION:   Selected notes from the MEDICAL RECORD NUMBER Referred by Dr. Milagros Reap for concern of ARMD OU LEE- 05.21.19 (Y. Le) [BCVA OD: 20/20 OS: 20/60] Ocular Hx-  PMH- A-fib, CAD, HTN    CURRENT MEDICATIONS: No current outpatient medications on file. (Ophthalmic Drugs)   No current facility-administered medications for this visit.  (Ophthalmic Drugs)   Current Outpatient Medications (Other)  Medication Sig  . acetaminophen (TYLENOL) 500 MG tablet Take 500 mg by mouth daily as needed for moderate pain.  Marland Kitchen apixaban (ELIQUIS) 2.5 MG TABS tablet Take 1 tablet (2.5 mg total) by mouth 2 (two) times daily.  . cyproheptadine (PERIACTIN) 2 MG/5ML syrup   . diltiazem (CARTIA XT) 240 MG 24 hr capsule Take 1 capsule (240 mg total) by mouth daily.  . ferrous sulfate 325 (65 FE) MG tablet Take 1 tablet (325 mg total) by mouth daily.  . furosemide (LASIX) 20 MG tablet Take 1 tablet (20 mg total) by mouth daily as needed for edema.  . traMADol (ULTRAM) 50 MG tablet Take 1-2 tablets (50-100 mg total)  by mouth every 6 (six) hours as needed.  . vitamin B-12 (CYANOCOBALAMIN) 1000 MCG tablet Take 1,000 mcg by mouth daily.  . cyproheptadine (PERIACTIN) 2 MG/5ML syrup Take 5 mLs (2 mg total) by mouth every 8 (eight) hours for 7 days, THEN 10 mLs (4 mg total) every 8 (eight) hours. (Patient not taking: Reported on 07/19/2017)   Current Facility-Administered Medications (Other)  Medication Route  . Bevacizumab (AVASTIN) SOLN 1.25 mg Intravitreal  . Bevacizumab (AVASTIN) SOLN 1.25 mg Intravitreal  . Bevacizumab (AVASTIN) SOLN 1.25 mg Intravitreal  . Bevacizumab (AVASTIN) SOLN 1.25 mg Intravitreal      REVIEW OF SYSTEMS: ROS    Positive for: Eyes   Negative for: Constitutional, Gastrointestinal, Neurological, Skin, Genitourinary, Musculoskeletal, HENT, Endocrine, Cardiovascular, Respiratory, Psychiatric, Allergic/Imm, Heme/Lymph   Last edited by Zenovia Jordan, LPN on 3/55/7322  0:25 AM. (History)       ALLERGIES Allergies  Allergen Reactions  . Levaquin [Levofloxacin] Other (See Comments)    Patient stated vision became blurry and became weak.    PAST MEDICAL HISTORY Past Medical History:  Diagnosis Date  . Afib ([place])   . Anemia   . Aortic stenosis    moderate by 07/19/17 echo  . Arthritis   . CAD (coronary artery disease)   . Chronic diastolic  heart failure ([place])   . Dyspnea   . HTN (hypertension)   . LBBB (left bundle branch block)   . Mitral regurgitation    Moderate, echo, 07/2012  . Pulmonary hypertension ([place])   . Varicose veins of both lower extremities    Past Surgical History:  Procedure Laterality Date  . CATARACT EXTRACTION Bilateral   . JOINT REPLACEMENT Right Feb 2015   Dr. Case- Ledell Noss  . TOTAL HIP ARTHROPLASTY Left 08/01/2017   Procedure: LEFT TOTAL HIP ARTHROPLASTY ANTERIOR APPROACH;  Surgeon: Mcarthur Rossetti, MD;  Location: [facility];  Service: Orthopedics;  Laterality: Left;  . TOTAL KNEE ARTHROPLASTY Left   . varicose vein laser ablation Bilateral      FAMILY HISTORY Family History  Problem Relation Age of Onset  . Diabetes Mother   . Amblyopia Neg Hx   . Blindness Neg Hx   . Cataracts Neg Hx   . Glaucoma Neg Hx   . Macular degeneration Neg Hx   . Retinal detachment Neg Hx   . Retinitis pigmentosa Neg Hx     SOCIAL HISTORY Social History   Tobacco Use  . Smoking status: Never Smoker  . Smokeless tobacco: Never Used  Substance Use Topics  . Alcohol use: No  . Drug use: No         OPHTHALMIC EXAM:  Base Eye Exam    Visual Acuity (Snellen - Linear)      Right Left   Dist cc 20/25 20/50 -1   Dist ph cc NI 20/40 -1   Correction:  Glasses       Tonometry (Tonopen, 9:51 AM)      Right Left   Pressure 16 18       Pupils      Dark Light Shape React APD   Right 4 3 Round Brisk None   Left 4 3 Round Brisk None       Visual Fields (Counting fingers)      Left Right    Full Full       Extraocular Movement      Right Left    Full, Ortho Full, Ortho       Neuro/Psych    Oriented x3:  Yes   Mood/Affect:  Normal       Dilation    Both eyes:  1.0% Mydriacyl, 2.5% Phenylephrine @ 9:49 AM        Slit Lamp and Fundus Exam    Slit Lamp Exam      Right Left   Lids/Lashes Dermatochalasis - upper lid Dermatochalasis - upper lid   Conjunctiva/Sclera White and quiet White and quiet   Cornea Arcus, crocodile shagreen, dry tear film, decreased TBUT, 2+ Punctate epithelial erosions Arcus, crocodile shagreen, dry tear film, decreased TBUT, 2+ Punctate epithelial erosions   Anterior Chamber Deep and quiet Deep and quiet   Iris Round and dilated Round and dilated   Lens PC IOL in good position PC IOL in good position   Vitreous Vitreous syneresis, Posterior vitreous detachment Vitreous syneresis, Posterior vitreous detachment       Fundus Exam      Right Left   Disc Pink and Sharp Pink and Sharp   C/D Ratio 0.3 0.3   Macula Good foveal reflex, RPE mottling and clumping, Drusen, pigment clumping ST to fovea,  few MAs, No heme or edema Blunted foveal reflex, +central CNVM with sub-retinal hemorrhage - improving, Drusen, RPE mottling and clumping, +edema / SRF inferior to fovea -  improving   Vessels Mild Vascular attenuation, mild AV crossing changes, Tortuous Mild Vascular attenuation, mild AV crossing changes, Tortuous   Periphery Attached, Reticular degeneration Attached, Choroidal nevus at 0600 0.5 DD, Reticular degeneration          IMAGING AND PROCEDURES  Imaging and Procedures for @TODAY @  OCT, Retina - OU - Both Eyes       Right Eye Quality was good. Central Foveal Thickness: 255. Progression has been stable. Findings include normal foveal contour, no IRF, no SRF, retinal drusen , outer retinal atrophy (Patchy ORA, trace cystic changes).   Left Eye Quality was good. Central Foveal Thickness: 276. Progression has improved. Findings include abnormal foveal contour, subretinal hyper-reflective material, subretinal fluid, no IRF, pigment epithelial detachment (Interval improvement in SRF superior to fovea, stable PED).   Notes *Images captured and stored on drive  Diagnosis / Impression:  Non-Exudative ARMD OD Exudative ARMD OS -- interval improvement in SRF and foveal contour  Clinical management:  See below  Abbreviations: NFP - Normal foveal profile. CME - cystoid macular edema. PED - pigment epithelial detachment. IRF - intraretinal fluid. SRF - subretinal fluid. EZ - ellipsoid zone. ERM - epiretinal membrane. ORA - outer retinal atrophy. ORT - outer retinal tubulation. SRHM - subretinal hyper-reflective material         Intravitreal Injection, Pharmacologic Agent - OS - Left Eye       Time Out 11/22/2017. 11:10 AM. Confirmed correct patient, procedure, site, and patient consented.   Anesthesia Topical anesthesia was used. Anesthetic medications included Tetracaine 0.5%, Lidocaine 2%.   Procedure Preparation included 5% betadine to ocular surface, eyelid speculum. A  supplied needle was used.   Injection:  1.25 mg Bevacizumab 1.25mg /0.21ml   NDC: 10626-948-54, Lot: [id number] , Expiration date: 12/06/2017   Route: Intravitreal, Site: Left Eye, Waste: 0 mg  Post-op Post injection exam found visual acuity of at least counting fingers. The patient tolerated the procedure well. There were no complications. The patient received written and verbal post procedure care education.                 ASSESSMENT/PLAN:    ICD-10-CM   1. Exudative age-related macular degeneration of left eye with active choroidal neovascularization (HCC) H35.3221 OCT, Retina - OU - Both Eyes    Intravitreal Injection, Pharmacologic Agent - OS - Left Eye    Bevacizumab (AVASTIN) SOLN 1.25 mg  2. Retinal edema H35.81 OCT, Retina - OU - Both Eyes  3. Intermediate stage nonexudative age-related macular degeneration of right eye H35.3112   4. Nevus of choroid of left eye D31.32   5. Posterior vitreous detachment of both eyes H43.813   6. Pseudophakia of both eyes Z96.1     1,2. Exudative age related macular degeneration, OS   - S/P IVA OS #1 (05.29.19), #2 (06.26.19), #3 (07.24.19)  - good response to avastin  - OCT with continued improvement in SRF and foveal contour  - BCVA improved to 20/40 from 20/80    - recommend IVA #4 OS today (08.21.19)  - RBA of procedure discussed, questions answered  - informed consent obtained and signed  - see procedure note  - f/u 4-5 weeks, DFE, OCT -- will try to treat/extend if SRF remains minimal  3. Age related macular degeneration, non-exudative, OD  - The incidence, anatomy, and pathology of dry AMD, risk of progression, and the AREDS and AREDS 2 study including smoking risks discussed with patient.  - Recommend amsler grid monitoring  4.  Choroidal Nevus OS-  - small flat pigmented choroidal nevus at 0600 - no subretinal fluid, symptoms, orange pigment or drusen - monitor  5.  PVD / vitreous syneresis  Discussed findings and  prognosis  No RT or RD on 360 peripheral exam  Reviewed s/s of RT/RD  Strict return precautions for any such RT/RD signs/symptoms  6. Pseudophakia OU  - s/p CE/IOL OU  - doing well  - monitor   Ophthalmic Meds Ordered this visit:  Meds ordered this encounter  Medications  . Bevacizumab (AVASTIN) SOLN 1.25 mg       Return in about 5 weeks (around 12/27/2017) for F/U Exu AMD OS, DFE, OCT.  There are no Patient Instructions on file for this visit.   Explained the diagnoses, plan, and follow up with the patient and they expressed understanding.  Patient expressed understanding of the importance of proper follow up care.   This document serves as a record of services personally performed by Gardiner Sleeper, MD, PhD. It was created on their behalf by Ernest Mallick, OA, an ophthalmic assistant. The creation of this record is the provider's dictation and/or activities during the visit.    Electronically signed by: Ernest Mallick, OA  08.20.2019 12:19 PM     Gardiner Sleeper, M.D., Ph.D. Diseases & Surgery of the Retina and Vitreous [facility]  I have reviewed the above documentation for accuracy and completeness, and I agree with the above. Gardiner Sleeper, M.D., Ph.D. 11/22/17 12:19 PM    Abbreviations: M myopia (nearsighted); A astigmatism; H hyperopia (farsighted); P presbyopia; Mrx spectacle prescription;  CTL contact lenses; OD right eye; OS left eye; OU both eyes  XT exotropia; ET esotropia; PEK punctate epithelial keratitis; PEE punctate epithelial erosions; DES dry eye syndrome; MGD meibomian gland dysfunction; ATs artificial tears; PFAT's preservative free artificial tears; King nuclear sclerotic cataract; PSC posterior subcapsular cataract; ERM epi-retinal membrane; PVD posterior vitreous detachment; RD retinal detachment; DM diabetes mellitus; DR diabetic retinopathy; NPDR non-proliferative diabetic retinopathy; PDR proliferative diabetic retinopathy;  CSME clinically significant macular edema; DME diabetic macular edema; dbh dot blot hemorrhages; CWS cotton wool spot; POAG primary open angle glaucoma; C/D cup-to-disc ratio; HVF humphrey visual field; GVF goldmann visual field; OCT optical coherence tomography; IOP intraocular pressure; BRVO Branch retinal vein occlusion; CRVO central retinal vein occlusion; CRAO central retinal artery occlusion; BRAO branch retinal artery occlusion; RT retinal tear; SB scleral buckle; PPV pars plana vitrectomy; VH Vitreous hemorrhage; PRP panretinal laser photocoagulation; IVK intravitreal kenalog; VMT vitreomacular traction; MH Macular hole;  NVD neovascularization of the disc; NVE neovascularization elsewhere; AREDS age related eye disease study; ARMD age related macular degeneration; POAG primary open angle glaucoma; EBMD epithelial/anterior basement membrane dystrophy; ACIOL anterior chamber intraocular lens; IOL intraocular lens; PCIOL posterior chamber intraocular lens; Phaco/IOL phacoemulsification with intraocular lens placement; [place] photorefractive keratectomy; LASIK laser assisted in situ keratomileusis; HTN hypertension; DM diabetes mellitus; COPD chronic obstructive pulmonary disease

## 2017-11-22 ENCOUNTER — Encounter (INDEPENDENT_AMBULATORY_CARE_PROVIDER_SITE_OTHER): Payer: Self-pay | Admitting: Ophthalmology

## 2017-11-22 ENCOUNTER — Ambulatory Visit (INDEPENDENT_AMBULATORY_CARE_PROVIDER_SITE_OTHER): Payer: Medicare Other | Admitting: Ophthalmology

## 2017-11-22 DIAGNOSIS — H43813 Vitreous degeneration, bilateral: Secondary | ICD-10-CM

## 2017-11-22 DIAGNOSIS — Z961 Presence of intraocular lens: Secondary | ICD-10-CM

## 2017-11-22 DIAGNOSIS — H353221 Exudative age-related macular degeneration, left eye, with active choroidal neovascularization: Secondary | ICD-10-CM | POA: Diagnosis not present

## 2017-11-22 DIAGNOSIS — H3581 Retinal edema: Secondary | ICD-10-CM | POA: Diagnosis not present

## 2017-11-22 DIAGNOSIS — D3132 Benign neoplasm of left choroid: Secondary | ICD-10-CM | POA: Diagnosis not present

## 2017-11-22 DIAGNOSIS — H353112 Nonexudative age-related macular degeneration, right eye, intermediate dry stage: Secondary | ICD-10-CM | POA: Diagnosis not present

## 2017-11-22 MED ORDER — BEVACIZUMAB CHEMO INJECTION 1.25MG/0.05ML SYRINGE FOR KALEIDOSCOPE
1.2500 mg | INTRAVITREAL | Status: AC
  Administered 2017-11-22: 1.25 mg via INTRAVITREAL

## 2017-11-27 ENCOUNTER — Telehealth: Payer: Self-pay | Admitting: Internal Medicine

## 2017-11-27 MED ORDER — APIXABAN 2.5 MG PO TABS: 2.5000 mg | ORAL_TABLET | Freq: Two times a day (BID) | ORAL | 1 refills | Status: AC

## 2017-11-27 NOTE — Telephone Encounter (Signed)
New Message:     *STAT* If patient is at the pharmacy, call can be transferred to refill team.   1. Which medications need to be refilled? (please list name of each medication and dose if known) apixaban (ELIQUIS) 2.5 MG TABS tablet  2. Which pharmacy/location (including street and city if local pharmacy) is medication to be sent to?  [place], [address]  3. Do they need a 30 day or 90 day supply? 90 Days

## 2017-11-27 NOTE — Telephone Encounter (Signed)
Pt last saw Dr Caryl Comes 06/23/17, last labs 08/02/17 Creat 0.99, age 84, weight 51.3kg, based on specified criteria pt is on appropriate dosage of Eliquis 2.5mg  BID.  Will refill rx.

## 2017-12-05 IMAGING — MR MR [PERSON_NAME] LOW W/O CM*R*
3 of 9 series · 9 of 40 positions shown · non-contrast
Comparison: None.

CLINICAL DATA: Nonhealing ulcer involving the right ankle.

EXAM:
MRI OF LOWER RIGHT EXTREMITY WITHOUT CONTRAST
TECHNIQUE: Multiplanar, multisequence MR imaging of the right lower extremity
was performed. No intravenous contrast was administered.

[Series 7: T1 · sagittal · 4.0mm · 0.43mm/px · 3 of 30 slices shown (1 of 3)]
[im 1/30]
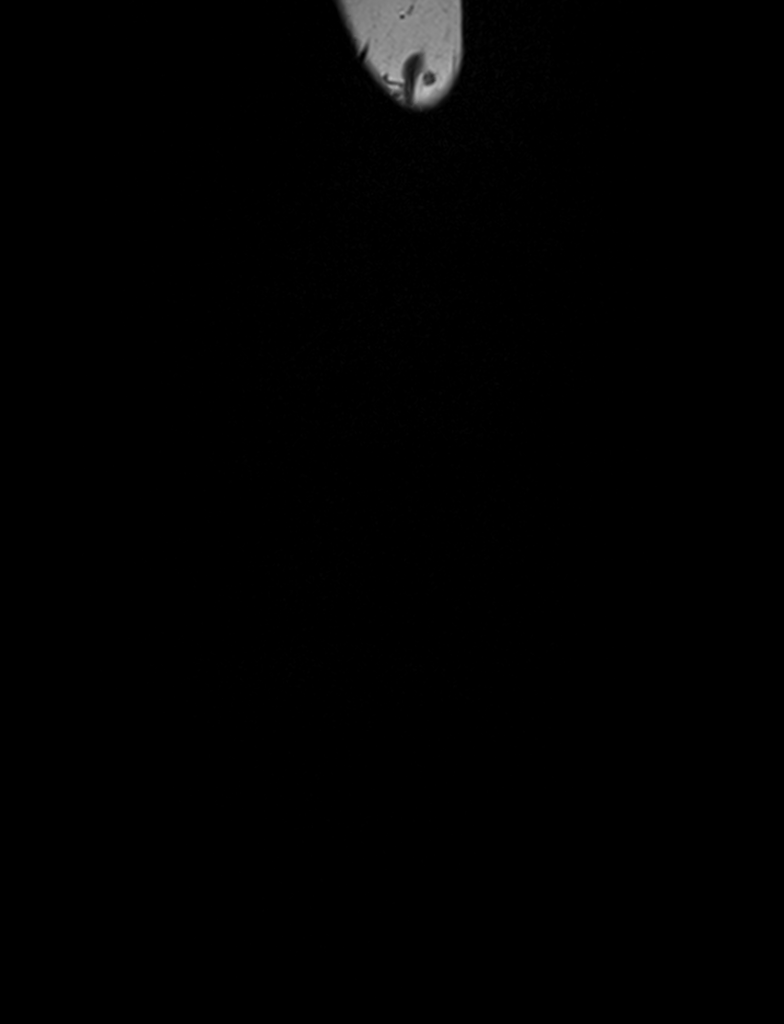
[im 20/30]
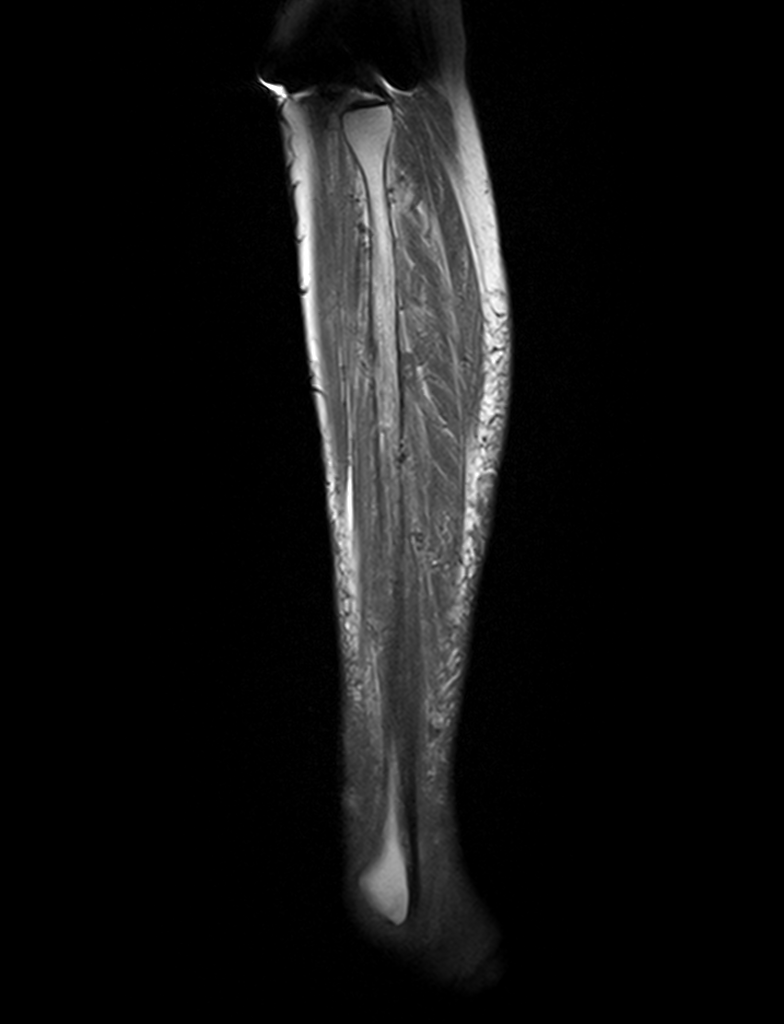
[im 30/30]
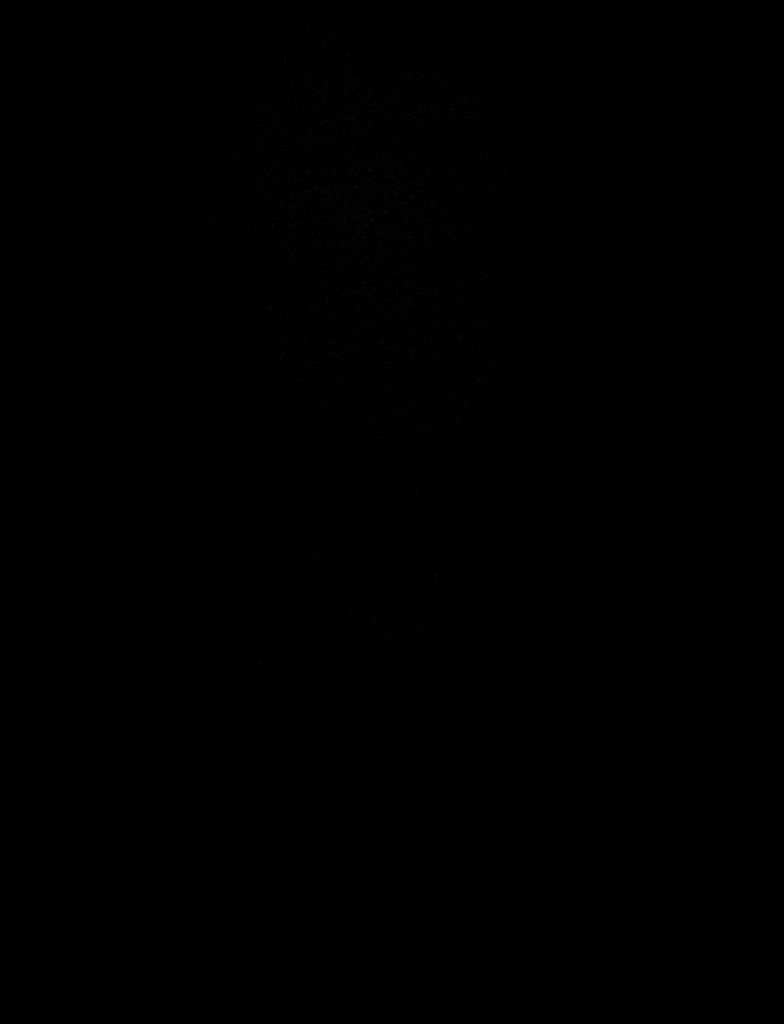

[Series 8: T1 · axial · 4.0mm · 0.46mm/px · z∈[-238,-26]mm · 3 of 44 slices shown (2 of 3)]
[im 1/44]
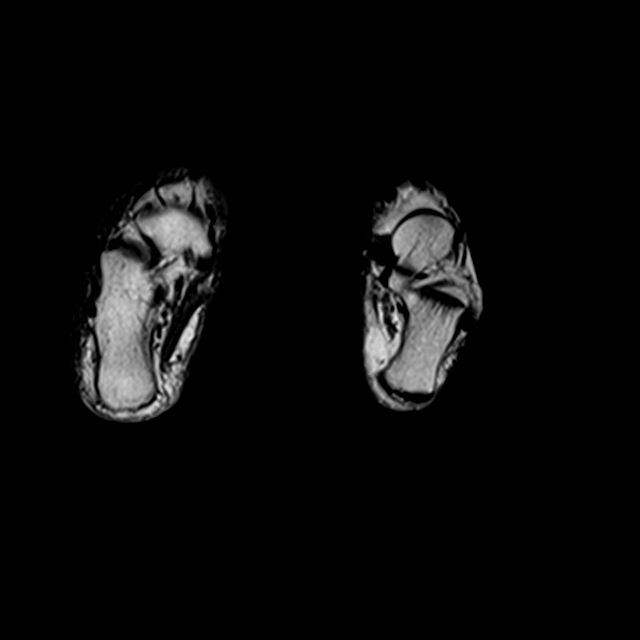
[im 22/44]
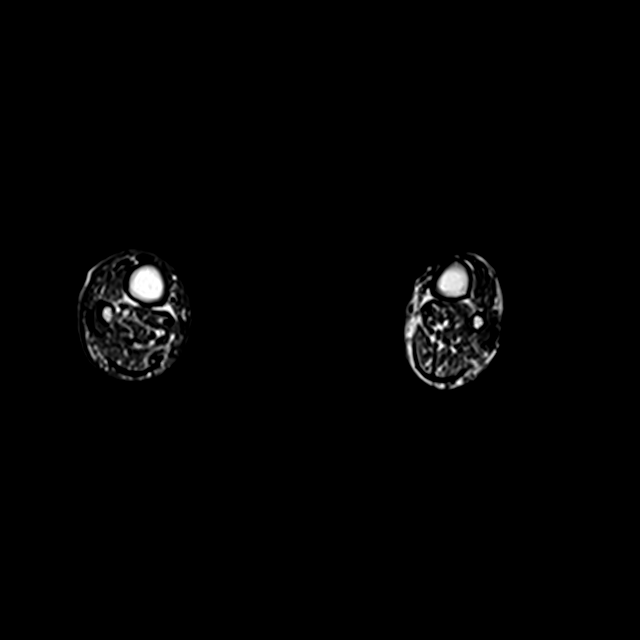
[im 44/44]
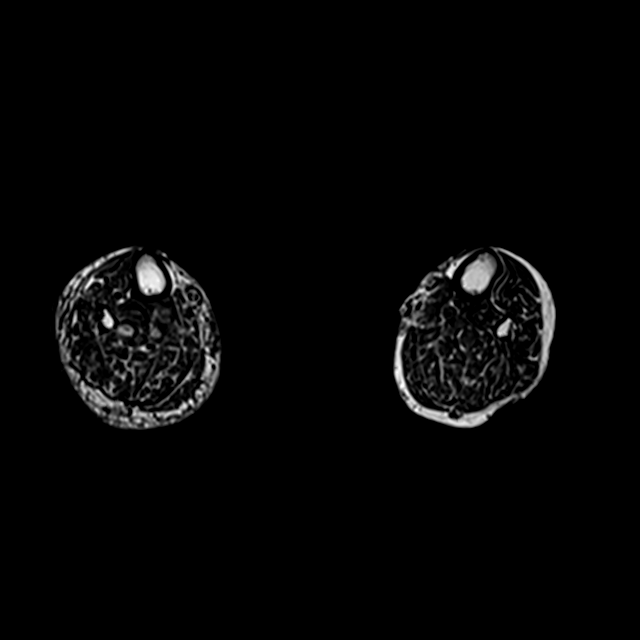

[Series 11: T1 · axial · 4.0mm · 0.46mm/px · z∈[-21,+192]mm · 3 of 41 slices shown (3 of 3)]
[im 1/41]
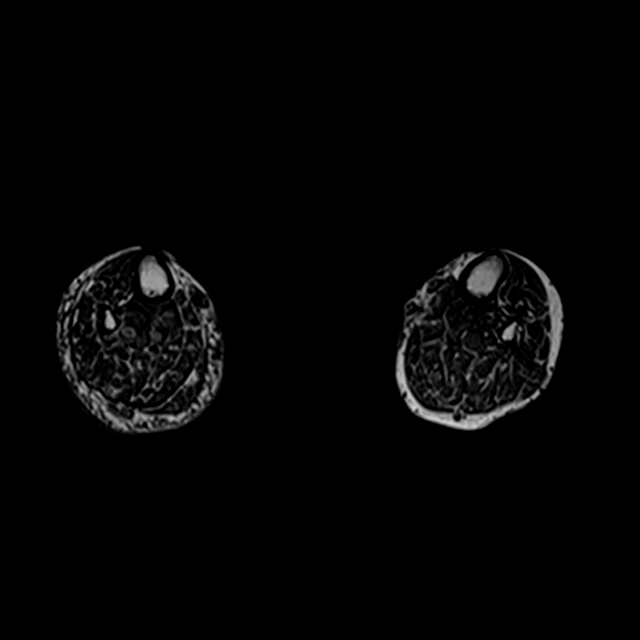
[im 21/41]
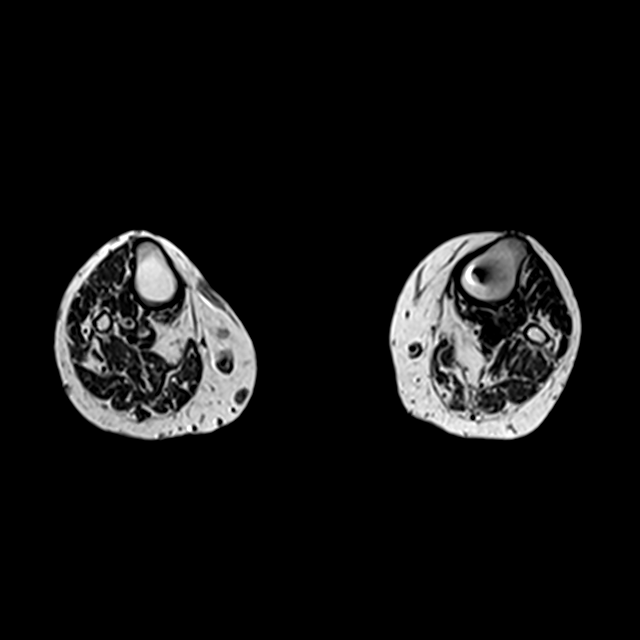
[im 41/41]
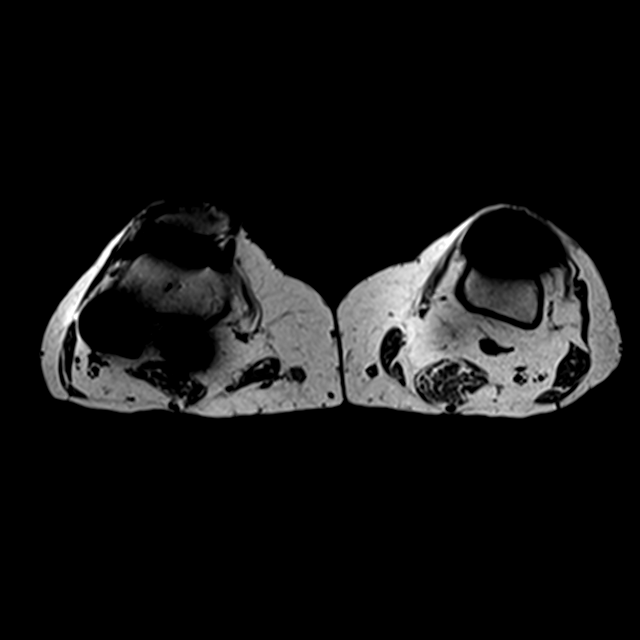

[9 of 40 positions shown; findings below may reference images not displayed]

FINDINGS: There is diffuse subcutaneous soft tissue swelling/edema/fluid
involving the right lower extremity consistent with cellulitis. No
findings to suggest myofasciitis or pyomyositis. No evidence of
septic arthritis or osteomyelitis. Bilateral knee prostheses are
noted.
IMPRESSION: MR findings suggest cellulitis but no discrete drainable soft tissue
abscess, myofasciitis, pyomyositis or osteomyelitis.

## 2017-12-26 NOTE — Progress Notes (Signed)
[facility] Note  12/27/2017     CHIEF COMPLAINT Patient presents for Retina Follow Up   HISTORY OF PRESENT ILLNESS: Jody [name] is a 84 y.o. female who presents to the clinic today for:   HPI    Retina Follow Up    Patient presents with  Wet AMD.  In left eye.  This started months ago.  Severity is moderate.  Duration of months.  Since onset it is gradually improving.  I, the attending physician,  performed the HPI with the patient and updated documentation appropriately.          Comments    84 y/o female pt here for 5 wk f/u for wet ARMD OS w/active choroidal neovascularization.  No change in [place] OU.  Denies pain, flashes, floaters.  No gtts.       Last edited by Bernarda Caffey, MD on 12/27/2017 10:51 AM. (History)      Referring physician: Sharion Balloon, [address] [place], [place] [address]  HISTORICAL INFORMATION:   Selected notes from the MEDICAL RECORD NUMBER Referred by Dr. Milagros Reap for concern of ARMD OU LEE- 05.21.19 (Y. Le) [BCVA OD: 20/20 OS: 20/60] Ocular Hx-  PMH- A-fib, CAD, HTN    CURRENT MEDICATIONS: No current outpatient medications on file. (Ophthalmic Drugs)   No current facility-administered medications for this visit.  (Ophthalmic Drugs)   Current Outpatient Medications (Other)  Medication Sig  . acetaminophen (TYLENOL) 500 MG tablet Take 500 mg by mouth daily as needed for moderate pain.  Marland Kitchen apixaban (ELIQUIS) 2.5 MG TABS tablet Take 1 tablet (2.5 mg total) by mouth 2 (two) times daily.  . cyproheptadine (PERIACTIN) 2 MG/5ML syrup Take 5 mLs (2 mg total) by mouth every 8 (eight) hours for 7 days, THEN 10 mLs (4 mg total) every 8 (eight) hours. (Patient not taking: Reported on 07/19/2017)  . cyproheptadine (PERIACTIN) 2 MG/5ML syrup   . diltiazem (CARTIA XT) 240 MG 24 hr capsule Take 1 capsule (240 mg total) by mouth daily.  . ferrous sulfate 325 (65 FE) MG tablet Take 1 tablet (325 mg total) by mouth  daily.  . furosemide (LASIX) 20 MG tablet Take 1 tablet (20 mg total) by mouth daily as needed for edema.  . traMADol (ULTRAM) 50 MG tablet Take 1-2 tablets (50-100 mg total) by mouth every 6 (six) hours as needed.  . vitamin B-12 (CYANOCOBALAMIN) 1000 MCG tablet Take 1,000 mcg by mouth daily.   Current Facility-Administered Medications (Other)  Medication Route  . Bevacizumab (AVASTIN) SOLN 1.25 mg Intravitreal  . Bevacizumab (AVASTIN) SOLN 1.25 mg Intravitreal  . Bevacizumab (AVASTIN) SOLN 1.25 mg Intravitreal  . Bevacizumab (AVASTIN) SOLN 1.25 mg Intravitreal  . Bevacizumab (AVASTIN) SOLN 1.25 mg Intravitreal      REVIEW OF SYSTEMS: ROS    Positive for: Eyes   Negative for: Constitutional, Gastrointestinal, Neurological, Skin, Genitourinary, Musculoskeletal, HENT, Endocrine, Cardiovascular, Respiratory, Psychiatric, Allergic/Imm, Heme/Lymph   Last edited by Matthew Folks, COA on 12/27/2017  9:38 AM. (History)       ALLERGIES Allergies  Allergen Reactions  . Levaquin [Levofloxacin] Other (See Comments)    Patient stated vision became blurry and became weak.    PAST MEDICAL HISTORY Past Medical History:  Diagnosis Date  . Afib ([place])   . Anemia   . Aortic stenosis    moderate by 07/19/17 echo  . Arthritis   . CAD (coronary artery disease)   . Chronic  diastolic heart failure ([place])   . Dyspnea   . HTN (hypertension)   . LBBB (left bundle branch block)   . Mitral regurgitation    Moderate, echo, 07/2012  . Pulmonary hypertension ([place])   . Varicose veins of both lower extremities    Past Surgical History:  Procedure Laterality Date  . CATARACT EXTRACTION Bilateral   . JOINT REPLACEMENT Right Feb 2015   Dr. Case- Ledell Noss  . TOTAL HIP ARTHROPLASTY Left 08/01/2017   Procedure: LEFT TOTAL HIP ARTHROPLASTY ANTERIOR APPROACH;  Surgeon: Mcarthur Rossetti, MD;  Location: [facility];  Service: Orthopedics;  Laterality: Left;  . TOTAL KNEE ARTHROPLASTY Left   . varicose vein  laser ablation Bilateral     FAMILY HISTORY Family History  Problem Relation Age of Onset  . Diabetes Mother   . Amblyopia Neg Hx   . Blindness Neg Hx   . Cataracts Neg Hx   . Glaucoma Neg Hx   . Macular degeneration Neg Hx   . Retinal detachment Neg Hx   . Retinitis pigmentosa Neg Hx     SOCIAL HISTORY Social History   Tobacco Use  . Smoking status: Never Smoker  . Smokeless tobacco: Never Used  Substance Use Topics  . Alcohol use: No  . Drug use: No         OPHTHALMIC EXAM:  Base Eye Exam    Visual Acuity (Snellen - Linear)      Right Left   Dist cc 20/25 + 20/40   Dist ph cc NI 20/30 -2   Correction:  Glasses       Tonometry (Tonopen, 9:47 AM)      Right Left   Pressure 11 12       Pupils      Dark Light Shape React APD   Right 4 3 Round Brisk None   Left 4 3 Round Brisk None       Visual Fields (Counting fingers)      Left Right    Full Full       Extraocular Movement      Right Left    Full, Ortho Full, Ortho       Neuro/Psych    Oriented x3:  Yes   Mood/Affect:  Normal       Dilation    Both eyes:  1.0% Mydriacyl, 2.5% Phenylephrine @ 9:47 AM        Slit Lamp and Fundus Exam    Slit Lamp Exam      Right Left   Lids/Lashes Dermatochalasis - upper lid Dermatochalasis - upper lid   Conjunctiva/Sclera White and quiet White and quiet   Cornea Arcus, crocodile shagreen, dry tear film, decreased TBUT, 2+ Punctate epithelial erosions Arcus, crocodile shagreen, dry tear film, decreased TBUT, 2+ Punctate epithelial erosions   Anterior Chamber Deep and quiet Deep and quiet   Iris Round and dilated Round and dilated   Lens PC IOL in good position PC IOL in good position   Vitreous Vitreous syneresis, Posterior vitreous detachment Vitreous syneresis, Posterior vitreous detachment       Fundus Exam      Right Left   Disc Pink and Sharp Pink and Sharp, Compact   C/D Ratio 0.3 0.2   Macula Blunted foveal reflex, RPE mottling and clumping,  Drusen, pigment clumping ST to fovea, few MAs, No heme or edema Blunted foveal reflex, +central CNVM with sub-retinal hemorrhage - improving, Drusen, RPE mottling and clumping, +edema / SRF inferior to  fovea - essentially resolved    Vessels Mild Vascular attenuation, mild AV crossing changes, Tortuous Mild Vascular attenuation, mild AV crossing changes, Tortuous   Periphery Attached, Reticular degeneration Attached, Choroidal nevus at 0600 0.5 DD, Reticular degeneration          IMAGING AND PROCEDURES  Imaging and Procedures for @TODAY @  OCT, Retina - OU - Both Eyes       Right Eye Quality was good. Central Foveal Thickness: 251. Progression has been stable. Findings include normal foveal contour, no IRF, no SRF, retinal drusen , outer retinal atrophy (Patchy ORA, trace cystic changes).   Left Eye Quality was good. Central Foveal Thickness: 271. Progression has been stable. Findings include subretinal hyper-reflective material, subretinal fluid, no IRF, pigment epithelial detachment, normal foveal contour (trace SRF superior to fovea stable from prior, stable PED).   Notes *Images captured and stored on drive  Diagnosis / Impression:  Non-Exudative ARMD OD Exudative ARMD OS -- trace SRF and improved foveal contour -- stable from prior  Clinical management:  See below  Abbreviations: NFP - Normal foveal profile. CME - cystoid macular edema. PED - pigment epithelial detachment. IRF - intraretinal fluid. SRF - subretinal fluid. EZ - ellipsoid zone. ERM - epiretinal membrane. ORA - outer retinal atrophy. ORT - outer retinal tubulation. SRHM - subretinal hyper-reflective material         Intravitreal Injection, Pharmacologic Agent - OS - Left Eye       Time Out 12/27/2017. 11:00 AM. Confirmed correct patient, procedure, site, and patient consented.   Anesthesia Topical anesthesia was used. Anesthetic medications included Lidocaine 2%, Proparacaine 0.5%.    Procedure Preparation included 5% betadine to ocular surface, eyelid speculum. A 30 gauge needle was used.   Injection:  1.25 mg Bevacizumab 1.25mg /0.44ml   NDC: 50242-060-01, Lot: [id number] , Expiration date: 02/03/2018   Route: Intravitreal, Site: Left Eye, Waste: 0 mg  Post-op Post injection exam found visual acuity of at least counting fingers. The patient tolerated the procedure well. There were no complications. The patient received written and verbal post procedure care education.                 ASSESSMENT/PLAN:    ICD-10-CM   1. Exudative age-related macular degeneration of left eye with active choroidal neovascularization (HCC) H35.3221 OCT, Retina - OU - Both Eyes    Intravitreal Injection, Pharmacologic Agent - OS - Left Eye    Bevacizumab (AVASTIN) SOLN 1.25 mg  2. Retinal edema H35.81 OCT, Retina - OU - Both Eyes  3. Intermediate stage nonexudative age-related macular degeneration of right eye H35.3112   4. Nevus of choroid of left eye D31.32   5. Posterior vitreous detachment of both eyes H43.813   6. Pseudophakia of both eyes Z96.1     1,2. Exudative age related macular degeneration, OS   - S/P IVA OS #1 (05.29.19), #2 (06.26.19), #3 (07.24.19), #4 (08.21.19)  - good response to avastin  - OCT with stable improvement in SRF and foveal contour with 5 wk interval  - BCVA improved to 20/40 from 20/80    - recommend IVA #5 OS today (09.25.19)  - RBA of procedure discussed, questions answered  - informed consent obtained and signed  - see procedure note  - f/u 6 weeks, DFE, OCT -- will continue to treat/extend if SRF remains minimal  3. Age related macular degeneration, non-exudative, OD  - The incidence, anatomy, and pathology of dry AMD, risk of progression, and the AREDS and AREDS  2 study including smoking risks discussed with patient.  - Recommend amsler grid monitoring  4. Choroidal Nevus OS-  - small flat pigmented choroidal nevus at 0600 - no  subretinal fluid, symptoms, orange pigment or drusen - monitor  5.  PVD / vitreous syneresis  Discussed findings and prognosis  No RT or RD on 360 peripheral exam  Reviewed s/s of RT/RD  Strict return precautions for any such RT/RD signs/symptoms  6. Pseudophakia OU  - s/p CE/IOL OU  - doing well  - monitor   Ophthalmic Meds Ordered this visit:  Meds ordered this encounter  Medications  . Bevacizumab (AVASTIN) SOLN 1.25 mg       Return in about 6 weeks (around 02/07/2018) for F/U Exu AMD OS, DFE, OCT.  There are no Patient Instructions on file for this visit.   Explained the diagnoses, plan, and follow up with the patient and they expressed understanding.  Patient expressed understanding of the importance of proper follow up care.   This document serves as a record of services personally performed by Gardiner Sleeper, MD, PhD. It was created on their behalf by Ernest Mallick, OA, an ophthalmic assistant. The creation of this record is the provider's dictation and/or activities during the visit.    Electronically signed by: Ernest Mallick, OA  09.24.19 12:16 PM    Gardiner Sleeper, M.D., Ph.D. Diseases & Surgery of the Retina and Vitreous [facility]  I have reviewed the above documentation for accuracy and completeness, and I agree with the above. Gardiner Sleeper, M.D., Ph.D. 12/27/17 12:17 PM   Abbreviations: M myopia (nearsighted); A astigmatism; H hyperopia (farsighted); P presbyopia; Mrx spectacle prescription;  CTL contact lenses; OD right eye; OS left eye; OU both eyes  XT exotropia; ET esotropia; PEK punctate epithelial keratitis; PEE punctate epithelial erosions; DES dry eye syndrome; MGD meibomian gland dysfunction; ATs artificial tears; PFAT's preservative free artificial tears; [place] nuclear sclerotic cataract; PSC posterior subcapsular cataract; ERM epi-retinal membrane; PVD posterior vitreous detachment; RD retinal detachment; DM diabetes  mellitus; DR diabetic retinopathy; NPDR non-proliferative diabetic retinopathy; PDR proliferative diabetic retinopathy; CSME clinically significant macular edema; DME diabetic macular edema; dbh dot blot hemorrhages; CWS cotton wool spot; POAG primary open angle glaucoma; C/D cup-to-disc ratio; HVF humphrey visual field; GVF goldmann visual field; OCT optical coherence tomography; IOP intraocular pressure; BRVO Branch retinal vein occlusion; CRVO central retinal vein occlusion; CRAO central retinal artery occlusion; BRAO branch retinal artery occlusion; RT retinal tear; SB scleral buckle; PPV pars plana vitrectomy; VH Vitreous hemorrhage; PRP panretinal laser photocoagulation; IVK intravitreal kenalog; VMT vitreomacular traction; MH Macular hole;  NVD neovascularization of the disc; NVE neovascularization elsewhere; AREDS age related eye disease study; ARMD age related macular degeneration; POAG primary open angle glaucoma; EBMD epithelial/anterior basement membrane dystrophy; ACIOL anterior chamber intraocular lens; IOL intraocular lens; PCIOL posterior chamber intraocular lens; Phaco/IOL phacoemulsification with intraocular lens placement; [place] photorefractive keratectomy; LASIK laser assisted in situ keratomileusis; HTN hypertension; DM diabetes mellitus; COPD chronic obstructive pulmonary disease

## 2017-12-27 ENCOUNTER — Ambulatory Visit (INDEPENDENT_AMBULATORY_CARE_PROVIDER_SITE_OTHER): Payer: Medicare Other | Admitting: Ophthalmology

## 2017-12-27 ENCOUNTER — Encounter (INDEPENDENT_AMBULATORY_CARE_PROVIDER_SITE_OTHER): Payer: Self-pay | Admitting: Ophthalmology

## 2017-12-27 DIAGNOSIS — D3132 Benign neoplasm of left choroid: Secondary | ICD-10-CM | POA: Diagnosis not present

## 2017-12-27 DIAGNOSIS — H3581 Retinal edema: Secondary | ICD-10-CM | POA: Diagnosis not present

## 2017-12-27 DIAGNOSIS — H353221 Exudative age-related macular degeneration, left eye, with active choroidal neovascularization: Secondary | ICD-10-CM | POA: Diagnosis not present

## 2017-12-27 DIAGNOSIS — H43813 Vitreous degeneration, bilateral: Secondary | ICD-10-CM | POA: Diagnosis not present

## 2017-12-27 DIAGNOSIS — H353112 Nonexudative age-related macular degeneration, right eye, intermediate dry stage: Secondary | ICD-10-CM

## 2017-12-27 DIAGNOSIS — Z961 Presence of intraocular lens: Secondary | ICD-10-CM

## 2017-12-27 MED ORDER — BEVACIZUMAB CHEMO INJECTION 1.25MG/0.05ML SYRINGE FOR KALEIDOSCOPE
1.2500 mg | INTRAVITREAL | Status: AC
  Administered 2017-12-27: 1.25 mg via INTRAVITREAL

## 2018-02-05 NOTE — Progress Notes (Signed)
[facility] Note  02/07/2018     CHIEF COMPLAINT Patient presents for Retina Follow Up   HISTORY OF PRESENT ILLNESS: Jody [name] is a 84 y.o. female who presents to the clinic today for:   HPI    Retina Follow Up    Patient presents with  Wet AMD.  In left eye.  This started months ago.  Severity is moderate.  Duration of months.  Since onset it is stable.  I, the attending physician,  performed the HPI with the patient and updated documentation appropriately.          Comments    84 y/o female pt here for 6 wk f/u for wet amd w/active choroidal neovascularization OS.  VA OS has improved since last visit, and is now "as good as the vision in her right eye."  Denies pain, flashes, floaters.  No gtts.       Last edited by Bernarda Caffey, MD on 02/07/2018 10:41 AM. (History)      Referring physician: Sharion Balloon, [address] [place], [place] [address]  HISTORICAL INFORMATION:   Selected notes from the MEDICAL RECORD NUMBER Referred by Dr. Milagros Reap for concern of ARMD OU LEE- 05.21.19 (Y. Le) [BCVA OD: 20/20 OS: 20/60] Ocular Hx-  PMH- A-fib, CAD, HTN    CURRENT MEDICATIONS: No current outpatient medications on file. (Ophthalmic Drugs)   No current facility-administered medications for this visit.  (Ophthalmic Drugs)   Current Outpatient Medications (Other)  Medication Sig  . acetaminophen (TYLENOL) 500 MG tablet Take 500 mg by mouth daily as needed for moderate pain.  Marland Kitchen apixaban (ELIQUIS) 2.5 MG TABS tablet Take 1 tablet (2.5 mg total) by mouth 2 (two) times daily.  . cyproheptadine (PERIACTIN) 2 MG/5ML syrup Take 5 mLs (2 mg total) by mouth every 8 (eight) hours for 7 days, THEN 10 mLs (4 mg total) every 8 (eight) hours. (Patient not taking: Reported on 07/19/2017)  . cyproheptadine (PERIACTIN) 2 MG/5ML syrup   . diltiazem (CARTIA XT) 240 MG 24 hr capsule Take 1 capsule (240 mg total) by mouth daily.  . ferrous sulfate 325 (65  FE) MG tablet Take 1 tablet (325 mg total) by mouth daily.  . furosemide (LASIX) 20 MG tablet Take 1 tablet (20 mg total) by mouth daily as needed for edema.  . traMADol (ULTRAM) 50 MG tablet Take 1-2 tablets (50-100 mg total) by mouth every 6 (six) hours as needed.  . vitamin B-12 (CYANOCOBALAMIN) 1000 MCG tablet Take 1,000 mcg by mouth daily.   Current Facility-Administered Medications (Other)  Medication Route  . Bevacizumab (AVASTIN) SOLN 1.25 mg Intravitreal  . Bevacizumab (AVASTIN) SOLN 1.25 mg Intravitreal  . Bevacizumab (AVASTIN) SOLN 1.25 mg Intravitreal  . Bevacizumab (AVASTIN) SOLN 1.25 mg Intravitreal  . Bevacizumab (AVASTIN) SOLN 1.25 mg Intravitreal  . Bevacizumab (AVASTIN) SOLN 1.25 mg Intravitreal      REVIEW OF SYSTEMS: ROS    Positive for: Eyes   Negative for: Constitutional, Gastrointestinal, Neurological, Skin, Genitourinary, Musculoskeletal, HENT, Endocrine, Cardiovascular, Respiratory, Psychiatric, Allergic/Imm, Heme/Lymph   Last edited by Matthew Folks, COA on 02/07/2018  9:39 AM. (History)       ALLERGIES Allergies  Allergen Reactions  . Levaquin [Levofloxacin] Other (See Comments)    Patient stated vision became blurry and became weak.    PAST MEDICAL HISTORY Past Medical History:  Diagnosis Date  . Afib ([place])   . Anemia   . Aortic stenosis  moderate by 07/19/17 echo  . Arthritis   . CAD (coronary artery disease)   . Chronic diastolic heart failure ([place])   . Dyspnea   . HTN (hypertension)   . LBBB (left bundle branch block)   . Mitral regurgitation    Moderate, echo, 07/2012  . Pulmonary hypertension ([place])   . Varicose veins of both lower extremities    Past Surgical History:  Procedure Laterality Date  . CATARACT EXTRACTION Bilateral   . JOINT REPLACEMENT Right Feb 2015   Dr. Case- Ledell Noss  . TOTAL HIP ARTHROPLASTY Left 08/01/2017   Procedure: LEFT TOTAL HIP ARTHROPLASTY ANTERIOR APPROACH;  Surgeon: Mcarthur Rossetti, MD;   Location: [facility];  Service: Orthopedics;  Laterality: Left;  . TOTAL KNEE ARTHROPLASTY Left   . varicose vein laser ablation Bilateral     FAMILY HISTORY Family History  Problem Relation Age of Onset  . Diabetes Mother   . Amblyopia Neg Hx   . Blindness Neg Hx   . Cataracts Neg Hx   . Glaucoma Neg Hx   . Macular degeneration Neg Hx   . Retinal detachment Neg Hx   . Retinitis pigmentosa Neg Hx     SOCIAL HISTORY Social History   Tobacco Use  . Smoking status: Never Smoker  . Smokeless tobacco: Never Used  Substance Use Topics  . Alcohol use: No  . Drug use: No         OPHTHALMIC EXAM:  Base Eye Exam    Visual Acuity (Snellen - Linear)      Right Left   Dist cc 20/25 -2 20/30 -2   Dist ph cc NI NI   Correction:  Glasses       Tonometry (Tonopen, 9:45 AM)      Right Left   Pressure 12 14       Pupils      Dark Light Shape React APD   Right 4 3 Round Brisk None   Left 4 3 Round Brisk None       Visual Fields (Counting fingers)      Left Right    Full Full       Extraocular Movement      Right Left    Full, Ortho Full, Ortho       Neuro/Psych    Oriented x3:  Yes   Mood/Affect:  Normal       Dilation    Both eyes:  1.0% Mydriacyl, 2.5% Phenylephrine @ 9:45 AM        Slit Lamp and Fundus Exam    Slit Lamp Exam      Right Left   Lids/Lashes Dermatochalasis - upper lid Dermatochalasis - upper lid   Conjunctiva/Sclera White and quiet White and quiet   Cornea Arcus, crocodile shagreen, dry tear film, decreased TBUT, 2+ Punctate epithelial erosions Arcus, crocodile shagreen, dry tear film, decreased TBUT, 2+ Punctate epithelial erosions   Anterior Chamber Deep and quiet Deep and quiet   Iris Round and dilated Round and dilated   Lens PC IOL in good position PC IOL in good position   Vitreous Vitreous syneresis, Posterior vitreous detachment Vitreous syneresis, Posterior vitreous detachment       Fundus Exam      Right Left   Disc Sharp, mild  pallor Pink and Sharp, Compact   C/D Ratio 0.3 0.2   Macula Blunted foveal reflex, RPE mottling and clumping, Drusen, pigment clumping ST to fovea, few MAs, No edema Blunted foveal reflex, +central  CNVM with sub-retinal hemorrhage - improved, Drusen, RPE mottling and clumping, +edema - improved   Vessels Mild Vascular attenuation, mild AV crossing changes, Tortuous Mild Vascular attenuation, mild AV crossing changes, Tortuous   Periphery Attached, Reticular degeneration Attached, Choroidal nevus at 0600 0.5 DD, Reticular degeneration          IMAGING AND PROCEDURES  Imaging and Procedures for @TODAY @  OCT, Retina - OU - Both Eyes       Right Eye Quality was good. Central Foveal Thickness: 312. Progression has been stable. Findings include normal foveal contour, no IRF, no SRF, retinal drusen , outer retinal atrophy (Patchy ORA, trace cystic changes).   Left Eye Quality was good. Central Foveal Thickness: 289. Progression has been stable. Findings include subretinal hyper-reflective material, subretinal fluid, no IRF, pigment epithelial detachment, normal foveal contour, epiretinal membrane (resolved SRF superior to fovea stable from prior, stable PED).   Notes *Images captured and stored on drive  Diagnosis / Impression:  Non-Exudative ARMD OD Exudative ARMD OS -- resolved SRF and improved foveal contour -- stable from prior  Clinical management:  See below  Abbreviations: NFP - Normal foveal profile. CME - cystoid macular edema. PED - pigment epithelial detachment. IRF - intraretinal fluid. SRF - subretinal fluid. EZ - ellipsoid zone. ERM - epiretinal membrane. ORA - outer retinal atrophy. ORT - outer retinal tubulation. SRHM - subretinal hyper-reflective material         Intravitreal Injection, Pharmacologic Agent - OS - Left Eye       Time Out 02/07/2018. 10:00 AM. Confirmed correct patient, procedure, site, and patient consented.   Anesthesia Topical anesthesia was  used. Anesthetic medications included Lidocaine 2%, Proparacaine 0.5%.   Procedure Preparation included 5% betadine to ocular surface, eyelid speculum. A 30 gauge needle was used.   Injection:  1.25 mg Bevacizumab 1.25mg /0.19ml   NDC: 50242-060-01, Lot: [id number] , Expiration date: 03/15/2018   Route: Intravitreal, Site: Left Eye, Waste: 0 mL  Post-op Post injection exam found visual acuity of at least counting fingers. The patient tolerated the procedure well. There were no complications. The patient received written and verbal post procedure care education.                 ASSESSMENT/PLAN:    ICD-10-CM   1. Exudative age-related macular degeneration of left eye with active choroidal neovascularization (HCC) H35.3221 Intravitreal Injection, Pharmacologic Agent - OS - Left Eye    Bevacizumab (AVASTIN) SOLN 1.25 mg  2. Retinal edema H35.81 OCT, Retina - OU - Both Eyes  3. Intermediate stage nonexudative age-related macular degeneration of right eye H35.3112   4. Nevus of choroid of left eye D31.32   5. Posterior vitreous detachment of both eyes H43.813   6. Pseudophakia of both eyes Z96.1     1,2. Exudative age related macular degeneration, OS   - S/P IVA OS #1 (05.29.19), #2 (06.26.19), #3 (07.24.19), #4 (08.21.19), #5 (09.25.19)  - good response to avastin  - OCT with stable improvement in SRF and foveal contour with 6 wk interval  - BCVA improved to 20/30 from 20/80    - recommend IVA #6 OS today (11.05.19)  - RBA of procedure discussed, questions answered  - informed consent obtained and signed  - see procedure note  - f/u 7-8 weeks, DFE, OCT -- will continue to treat/extend if SRF remains minimal  3. Age related macular degeneration, non-exudative, OD  - The incidence, anatomy, and pathology of dry AMD, risk of progression, and the  AREDS and AREDS 2 study including smoking risks discussed with patient.  - Recommend amsler grid monitoring  4. Choroidal Nevus OS-   - small flat pigmented choroidal nevus at 0600 - no subretinal fluid, symptoms, orange pigment or drusen - monitor  5.  PVD / vitreous syneresis  Discussed findings and prognosis  No RT or RD on 360 peripheral exam  Reviewed s/s of RT/RD  Strict return precautions for any such RT/RD signs/symptoms  6. Pseudophakia OU  - s/p CE/IOL OU  - doing well  - monitor   Ophthalmic Meds Ordered this visit:  Meds ordered this encounter  Medications  . Bevacizumab (AVASTIN) SOLN 1.25 mg       Return in about 8 weeks (around 04/02/2018) for F/U Exu ARMD OS DFE, OCT.  There are no Patient Instructions on file for this visit.   Explained the diagnoses, plan, and follow up with the patient and they expressed understanding.  Patient expressed understanding of the importance of proper follow up care.   This document serves as a record of services personally performed by Gardiner Sleeper, MD, PhD. It was created on their behalf by Ernest Mallick, OA, an ophthalmic assistant. The creation of this record is the provider's dictation and/or activities during the visit.    Electronically signed by: Ernest Mallick, OA  11.04.19 2:39 PM     Gardiner Sleeper, M.D., Ph.D. Diseases & Surgery of the Retina and Vitreous [facility]  I have reviewed the above documentation for accuracy and completeness, and I agree with the above. Gardiner Sleeper, M.D., Ph.D. 02/07/18 2:41 PM    Abbreviations: M myopia (nearsighted); A astigmatism; H hyperopia (farsighted); P presbyopia; Mrx spectacle prescription;  CTL contact lenses; OD right eye; OS left eye; OU both eyes  XT exotropia; ET esotropia; PEK punctate epithelial keratitis; PEE punctate epithelial erosions; DES dry eye syndrome; MGD meibomian gland dysfunction; ATs artificial tears; PFAT's preservative free artificial tears; [place] nuclear sclerotic cataract; PSC posterior subcapsular cataract; ERM epi-retinal membrane; PVD posterior  vitreous detachment; RD retinal detachment; DM diabetes mellitus; DR diabetic retinopathy; NPDR non-proliferative diabetic retinopathy; PDR proliferative diabetic retinopathy; CSME clinically significant macular edema; DME diabetic macular edema; dbh dot blot hemorrhages; CWS cotton wool spot; POAG primary open angle glaucoma; C/D cup-to-disc ratio; HVF humphrey visual field; GVF goldmann visual field; OCT optical coherence tomography; IOP intraocular pressure; BRVO Branch retinal vein occlusion; CRVO central retinal vein occlusion; CRAO central retinal artery occlusion; BRAO branch retinal artery occlusion; RT retinal tear; SB scleral buckle; PPV pars plana vitrectomy; VH Vitreous hemorrhage; PRP panretinal laser photocoagulation; IVK intravitreal kenalog; VMT vitreomacular traction; MH Macular hole;  NVD neovascularization of the disc; NVE neovascularization elsewhere; AREDS age related eye disease study; ARMD age related macular degeneration; POAG primary open angle glaucoma; EBMD epithelial/anterior basement membrane dystrophy; ACIOL anterior chamber intraocular lens; IOL intraocular lens; PCIOL posterior chamber intraocular lens; Phaco/IOL phacoemulsification with intraocular lens placement; [place] photorefractive keratectomy; LASIK laser assisted in situ keratomileusis; HTN hypertension; DM diabetes mellitus; COPD chronic obstructive pulmonary disease

## 2018-02-07 ENCOUNTER — Encounter (INDEPENDENT_AMBULATORY_CARE_PROVIDER_SITE_OTHER): Payer: Self-pay | Admitting: Ophthalmology

## 2018-02-07 ENCOUNTER — Ambulatory Visit (INDEPENDENT_AMBULATORY_CARE_PROVIDER_SITE_OTHER): Payer: Medicare Other | Admitting: Ophthalmology

## 2018-02-07 DIAGNOSIS — H353221 Exudative age-related macular degeneration, left eye, with active choroidal neovascularization: Secondary | ICD-10-CM | POA: Diagnosis not present

## 2018-02-07 DIAGNOSIS — H353112 Nonexudative age-related macular degeneration, right eye, intermediate dry stage: Secondary | ICD-10-CM

## 2018-02-07 DIAGNOSIS — H3581 Retinal edema: Secondary | ICD-10-CM

## 2018-02-07 DIAGNOSIS — D3132 Benign neoplasm of left choroid: Secondary | ICD-10-CM

## 2018-02-07 DIAGNOSIS — Z961 Presence of intraocular lens: Secondary | ICD-10-CM

## 2018-02-07 DIAGNOSIS — H43813 Vitreous degeneration, bilateral: Secondary | ICD-10-CM

## 2018-02-07 MED ORDER — BEVACIZUMAB CHEMO INJECTION 1.25MG/0.05ML SYRINGE FOR KALEIDOSCOPE
1.2500 mg | INTRAVITREAL | Status: AC
  Administered 2018-02-07: 1.25 mg via INTRAVITREAL

## 2018-02-08 ENCOUNTER — Encounter (INDEPENDENT_AMBULATORY_CARE_PROVIDER_SITE_OTHER): Payer: Self-pay | Admitting: Ophthalmology

## 2018-02-10 DIAGNOSIS — I447 Left bundle-branch block, unspecified: Secondary | ICD-10-CM | POA: Diagnosis not present

## 2018-02-10 DIAGNOSIS — Z7901 Long term (current) use of anticoagulants: Secondary | ICD-10-CM | POA: Diagnosis not present

## 2018-02-10 DIAGNOSIS — R110 Nausea: Secondary | ICD-10-CM | POA: Diagnosis not present

## 2018-02-10 DIAGNOSIS — R42 Dizziness and giddiness: Secondary | ICD-10-CM | POA: Diagnosis not present

## 2018-02-10 DIAGNOSIS — I700 Atherosclerosis of aorta: Secondary | ICD-10-CM | POA: Diagnosis not present

## 2018-02-10 DIAGNOSIS — Z79899 Other long term (current) drug therapy: Secondary | ICD-10-CM | POA: Diagnosis not present

## 2018-02-10 DIAGNOSIS — I4891 Unspecified atrial fibrillation: Secondary | ICD-10-CM | POA: Diagnosis not present

## 2018-02-10 DIAGNOSIS — R531 Weakness: Secondary | ICD-10-CM | POA: Diagnosis not present

## 2018-02-10 DIAGNOSIS — R112 Nausea with vomiting, unspecified: Secondary | ICD-10-CM | POA: Diagnosis not present

## 2018-02-10 DIAGNOSIS — R079 Chest pain, unspecified: Secondary | ICD-10-CM | POA: Diagnosis not present

## 2018-02-10 DIAGNOSIS — I10 Essential (primary) hypertension: Secondary | ICD-10-CM | POA: Diagnosis not present

## 2018-02-10 DIAGNOSIS — R7989 Other specified abnormal findings of blood chemistry: Secondary | ICD-10-CM | POA: Diagnosis not present

## 2018-02-10 DIAGNOSIS — I252 Old myocardial infarction: Secondary | ICD-10-CM | POA: Diagnosis not present

## 2018-02-10 DIAGNOSIS — N3001 Acute cystitis with hematuria: Secondary | ICD-10-CM | POA: Diagnosis not present

## 2018-02-10 DIAGNOSIS — R05 Cough: Secondary | ICD-10-CM | POA: Diagnosis not present

## 2018-02-10 DIAGNOSIS — R0689 Other abnormalities of breathing: Secondary | ICD-10-CM | POA: Diagnosis not present

## 2018-02-15 ENCOUNTER — Inpatient Hospital Stay (HOSPITAL_COMMUNITY): Payer: Medicare Other

## 2018-02-15 DIAGNOSIS — I272 Pulmonary hypertension, unspecified: Secondary | ICD-10-CM | POA: Diagnosis present

## 2018-02-15 DIAGNOSIS — Z7901 Long term (current) use of anticoagulants: Secondary | ICD-10-CM | POA: Diagnosis not present

## 2018-02-15 DIAGNOSIS — I8393 Asymptomatic varicose veins of bilateral lower extremities: Secondary | ICD-10-CM | POA: Diagnosis present

## 2018-02-15 DIAGNOSIS — I4821 Permanent atrial fibrillation: Secondary | ICD-10-CM | POA: Diagnosis not present

## 2018-02-15 DIAGNOSIS — J810 Acute pulmonary edema: Secondary | ICD-10-CM | POA: Diagnosis not present

## 2018-02-15 DIAGNOSIS — I214 Non-ST elevation (NSTEMI) myocardial infarction: Secondary | ICD-10-CM | POA: Diagnosis not present

## 2018-02-15 DIAGNOSIS — I2510 Atherosclerotic heart disease of native coronary artery without angina pectoris: Secondary | ICD-10-CM | POA: Diagnosis not present

## 2018-02-15 DIAGNOSIS — Z452 Encounter for adjustment and management of vascular access device: Secondary | ICD-10-CM

## 2018-02-15 DIAGNOSIS — Z4682 Encounter for fitting and adjustment of non-vascular catheter: Secondary | ICD-10-CM | POA: Diagnosis not present

## 2018-02-15 DIAGNOSIS — R64 Cachexia: Secondary | ICD-10-CM | POA: Diagnosis not present

## 2018-02-15 DIAGNOSIS — I130 Hypertensive heart and chronic kidney disease with heart failure and stage 1 through stage 4 chronic kidney disease, or unspecified chronic kidney disease: Secondary | ICD-10-CM | POA: Diagnosis not present

## 2018-02-15 DIAGNOSIS — I480 Paroxysmal atrial fibrillation: Secondary | ICD-10-CM | POA: Diagnosis not present

## 2018-02-15 DIAGNOSIS — I340 Nonrheumatic mitral (valve) insufficiency: Secondary | ICD-10-CM | POA: Diagnosis not present

## 2018-02-15 DIAGNOSIS — I5030 Unspecified diastolic (congestive) heart failure: Secondary | ICD-10-CM | POA: Diagnosis present

## 2018-02-15 DIAGNOSIS — Z515 Encounter for palliative care: Secondary | ICD-10-CM | POA: Diagnosis not present

## 2018-02-15 DIAGNOSIS — Z9911 Dependence on respirator [ventilator] status: Secondary | ICD-10-CM | POA: Diagnosis not present

## 2018-02-15 DIAGNOSIS — Z87440 Personal history of urinary (tract) infections: Secondary | ICD-10-CM

## 2018-02-15 DIAGNOSIS — Z66 Do not resuscitate: Secondary | ICD-10-CM | POA: Diagnosis not present

## 2018-02-15 DIAGNOSIS — Z96642 Presence of left artificial hip joint: Secondary | ICD-10-CM | POA: Diagnosis present

## 2018-02-15 DIAGNOSIS — J9601 Acute respiratory failure with hypoxia: Secondary | ICD-10-CM | POA: Diagnosis present

## 2018-02-15 DIAGNOSIS — I739 Peripheral vascular disease, unspecified: Secondary | ICD-10-CM | POA: Diagnosis present

## 2018-02-15 DIAGNOSIS — R0902 Hypoxemia: Secondary | ICD-10-CM | POA: Diagnosis not present

## 2018-02-15 DIAGNOSIS — R34 Anuria and oliguria: Secondary | ICD-10-CM | POA: Diagnosis present

## 2018-02-15 DIAGNOSIS — Z9841 Cataract extraction status, right eye: Secondary | ICD-10-CM

## 2018-02-15 DIAGNOSIS — I255 Ischemic cardiomyopathy: Secondary | ICD-10-CM

## 2018-02-15 DIAGNOSIS — Z96652 Presence of left artificial knee joint: Secondary | ICD-10-CM | POA: Diagnosis not present

## 2018-02-15 DIAGNOSIS — Z833 Family history of diabetes mellitus: Secondary | ICD-10-CM

## 2018-02-15 DIAGNOSIS — R579 Shock, unspecified: Secondary | ICD-10-CM | POA: Diagnosis present

## 2018-02-15 DIAGNOSIS — N170 Acute kidney failure with tubular necrosis: Secondary | ICD-10-CM | POA: Diagnosis not present

## 2018-02-15 DIAGNOSIS — G9340 Encephalopathy, unspecified: Secondary | ICD-10-CM | POA: Diagnosis present

## 2018-02-15 DIAGNOSIS — I447 Left bundle-branch block, unspecified: Secondary | ICD-10-CM | POA: Diagnosis present

## 2018-02-15 DIAGNOSIS — M1990 Unspecified osteoarthritis, unspecified site: Secondary | ICD-10-CM | POA: Diagnosis present

## 2018-02-15 DIAGNOSIS — I509 Heart failure, unspecified: Secondary | ICD-10-CM | POA: Diagnosis not present

## 2018-02-15 DIAGNOSIS — I700 Atherosclerosis of aorta: Secondary | ICD-10-CM | POA: Diagnosis not present

## 2018-02-15 DIAGNOSIS — Z79899 Other long term (current) drug therapy: Secondary | ICD-10-CM | POA: Diagnosis not present

## 2018-02-15 DIAGNOSIS — I4891 Unspecified atrial fibrillation: Secondary | ICD-10-CM | POA: Diagnosis not present

## 2018-02-15 DIAGNOSIS — R570 Cardiogenic shock: Secondary | ICD-10-CM | POA: Diagnosis not present

## 2018-02-15 DIAGNOSIS — I5032 Chronic diastolic (congestive) heart failure: Secondary | ICD-10-CM | POA: Diagnosis not present

## 2018-02-15 DIAGNOSIS — R918 Other nonspecific abnormal finding of lung field: Secondary | ICD-10-CM | POA: Diagnosis not present

## 2018-02-15 DIAGNOSIS — E872 Acidosis: Secondary | ICD-10-CM | POA: Diagnosis not present

## 2018-02-15 DIAGNOSIS — Z9842 Cataract extraction status, left eye: Secondary | ICD-10-CM

## 2018-02-15 DIAGNOSIS — I10 Essential (primary) hypertension: Secondary | ICD-10-CM | POA: Diagnosis present

## 2018-02-15 DIAGNOSIS — N179 Acute kidney failure, unspecified: Secondary | ICD-10-CM | POA: Diagnosis not present

## 2018-02-15 DIAGNOSIS — Z881 Allergy status to other antibiotic agents status: Secondary | ICD-10-CM

## 2018-02-15 DIAGNOSIS — R0602 Shortness of breath: Secondary | ICD-10-CM | POA: Diagnosis not present

## 2018-02-15 DIAGNOSIS — N390 Urinary tract infection, site not specified: Secondary | ICD-10-CM | POA: Diagnosis present

## 2018-02-15 DIAGNOSIS — R0989 Other specified symptoms and signs involving the circulatory and respiratory systems: Secondary | ICD-10-CM | POA: Diagnosis not present

## 2018-02-15 DIAGNOSIS — Z7902 Long term (current) use of antithrombotics/antiplatelets: Secondary | ICD-10-CM | POA: Diagnosis not present

## 2018-02-15 DIAGNOSIS — J90 Pleural effusion, not elsewhere classified: Secondary | ICD-10-CM | POA: Diagnosis not present

## 2018-02-15 DIAGNOSIS — E875 Hyperkalemia: Secondary | ICD-10-CM | POA: Diagnosis present

## 2018-02-15 DIAGNOSIS — I110 Hypertensive heart disease with heart failure: Secondary | ICD-10-CM | POA: Diagnosis not present

## 2018-02-15 DIAGNOSIS — I959 Hypotension, unspecified: Secondary | ICD-10-CM | POA: Diagnosis not present

## 2018-02-15 DIAGNOSIS — Z6820 Body mass index (BMI) 20.0-20.9, adult: Secondary | ICD-10-CM

## 2018-02-15 LAB — POCT I-STAT 3, ART BLOOD GAS (G3+)
Acid-base deficit: 15 mmol/L — ABNORMAL HIGH (ref 0.0–2.0)
Acid-base deficit: 15 mmol/L — ABNORMAL HIGH (ref 0.0–2.0)
Acid-base deficit: 15 mmol/L — ABNORMAL HIGH (ref 0.0–2.0)
Acid-base deficit: 16 mmol/L — ABNORMAL HIGH (ref 0.0–2.0)
BICARBONATE: 12.1 mmol/L — AB (ref 20.0–28.0)
Bicarbonate: 14.8 mmol/L — ABNORMAL LOW (ref 20.0–28.0)
Bicarbonate: 13.1 mmol/L — ABNORMAL LOW (ref 20.0–28.0)
Bicarbonate: 11.5 mmol/L — ABNORMAL LOW (ref 20.0–28.0)
O2 SAT: 98 %
O2 SAT: 96 %
O2 Saturation: 98 %
O2 Saturation: 97 %
PCO2 ART: 31.7 mmHg — AB (ref 32.0–48.0)
PH ART: 7.104 — AB (ref 7.350–7.450)
PH ART: 7.14 — AB (ref 7.350–7.450)
PO2 ART: 102 mmHg (ref 83.0–108.0)
Patient temperature: 36.3
Patient temperature: 36.3
TCO2: 16 mmol/L — ABNORMAL LOW (ref 22–32)
TCO2: 14 mmol/L — ABNORMAL LOW (ref 22–32)
TCO2: 13 mmol/L — AB (ref 22–32)
TCO2: 12 mmol/L — ABNORMAL LOW (ref 22–32)
pCO2 arterial: 46.9 mmHg (ref 32.0–48.0)
pCO2 arterial: 38.1 mmHg (ref 32.0–48.0)
pCO2 arterial: 32.1 mmHg (ref 32.0–48.0)
pH, Arterial: 7.181 — CL (ref 7.350–7.450)
pH, Arterial: 7.164 — CL (ref 7.350–7.450)
pO2, Arterial: 131 mmHg — ABNORMAL HIGH (ref 83.0–108.0)
pO2, Arterial: 128 mmHg — ABNORMAL HIGH (ref 83.0–108.0)
pO2, Arterial: 118 mmHg — ABNORMAL HIGH (ref 83.0–108.0)

## 2018-02-15 LAB — URINALYSIS, ROUTINE W REFLEX MICROSCOPIC
Glucose, UA: NEGATIVE mg/dL
HGB URINE DIPSTICK: NEGATIVE
Leukocytes, UA: NEGATIVE
Nitrite: NEGATIVE
PROTEIN: 30 mg/dL — AB
Specific Gravity, Urine: >1.03 — ABNORMAL HIGH (ref 1.005–1.030)
pH: 6 (ref 5.0–8.0)

## 2018-02-15 LAB — COMPREHENSIVE METABOLIC PANEL
ALK PHOS: 111 U/L (ref 38–126)
ALT: 885 U/L — AB (ref 0–44)
AST: 1085 U/L — AB (ref 15–41)
Albumin: 3.5 g/dL (ref 3.5–5.0)
Anion gap: 18 — ABNORMAL HIGH (ref 5–15)
BUN: 31 mg/dL — AB (ref 8–23)
CHLORIDE: 107 mmol/L (ref 98–111)
CO2: 12 mmol/L — AB (ref 22–32)
CREATININE: 2.39 mg/dL — AB (ref 0.44–1.00)
Calcium: 8.5 mg/dL — ABNORMAL LOW (ref 8.9–10.3)
GFR calc Af Amer: 20 mL/min — ABNORMAL LOW (ref >60)
GFR calc non Af Amer: 17 mL/min — ABNORMAL LOW (ref >60)
GLUCOSE: 207 mg/dL — AB (ref 70–99)
Potassium: 4.9 mmol/L (ref 3.5–5.1)
SODIUM: 137 mmol/L (ref 135–145)
Total Bilirubin: 1 mg/dL (ref 0.3–1.2)
Total Protein: 6.6 g/dL (ref 6.5–8.1)

## 2018-02-15 LAB — APTT: aPTT: 35 seconds (ref 24–36)

## 2018-02-15 LAB — LACTIC ACID, PLASMA: Lactic Acid, Venous: 6.6 mmol/L (ref 0.5–1.9)

## 2018-02-15 LAB — MRSA PCR SCREENING: MRSA BY PCR: NEGATIVE

## 2018-02-15 LAB — TROPONIN I: Troponin I: 3.09 ng/mL (ref <0.03)

## 2018-02-15 LAB — CORTISOL: CORTISOL PLASMA: 52.3 ug/dL

## 2018-02-15 LAB — PROCALCITONIN: PROCALCITONIN: 0.27 ng/mL

## 2018-02-15 LAB — PHOSPHORUS: Phosphorus: 8 mg/dL — ABNORMAL HIGH (ref 2.5–4.6)

## 2018-02-15 LAB — GLUCOSE, CAPILLARY
GLUCOSE-CAPILLARY: 177 mg/dL — AB (ref 70–99)
Glucose-Capillary: 201 mg/dL — ABNORMAL HIGH (ref 70–99)

## 2018-02-15 LAB — COOXEMETRY PANEL
Carboxyhemoglobin: 0.5 % (ref 0.5–1.5)
Methemoglobin: 1.1 % (ref 0.0–1.5)
O2 Saturation: 59.3 %
Total hemoglobin: 14.9 g/dL (ref 12.0–16.0)

## 2018-02-15 LAB — MAGNESIUM: Magnesium: 2.3 mg/dL (ref 1.7–2.4)

## 2018-02-15 MED ORDER — NOREPINEPHRINE 4 MG/250ML-% IV SOLN
0.0000 ug/min | INTRAVENOUS | Status: DC
  Filled 2018-02-15: qty 250

## 2018-02-15 MED ORDER — CHLORHEXIDINE GLUCONATE 0.12% ORAL RINSE (MEDLINE KIT): 15.0000 mL | Freq: Two times a day (BID) | OROMUCOSAL | Status: DC

## 2018-02-15 MED ORDER — ORAL CARE MOUTH RINSE
15.0000 mL | OROMUCOSAL | Status: DC
  Administered 2018-02-15 – 2018-02-16 (×7): 15 mL via OROMUCOSAL

## 2018-02-15 MED ORDER — IPRATROPIUM-ALBUTEROL 0.5-2.5 (3) MG/3ML IN SOLN: 3.0000 mL | Freq: Four times a day (QID) | RESPIRATORY_TRACT | Status: DC | PRN

## 2018-02-15 MED ORDER — PIPERACILLIN-TAZOBACTAM 3.375 G IVPB
3.3750 g | Freq: Two times a day (BID) | INTRAVENOUS | Status: DC
  Administered 2018-02-15 – 2018-02-16 (×2): 3.375 g via INTRAVENOUS
  Filled 2018-02-15 (×2): qty 50

## 2018-02-15 MED ORDER — PANTOPRAZOLE SODIUM 40 MG PO PACK
40.0000 mg | PACK | Freq: Every day | ORAL | Status: DC
  Filled 2018-02-15: qty 20

## 2018-02-15 MED ORDER — INSULIN ASPART 100 UNIT/ML ~~LOC~~ SOLN
0.0000 [IU] | SUBCUTANEOUS | Status: DC
  Administered 2018-02-15 – 2018-02-16 (×2): 3 [IU] via SUBCUTANEOUS
  Administered 2018-02-16: 1 [IU] via SUBCUTANEOUS
  Administered 2018-02-16: 3 [IU] via SUBCUTANEOUS
  Administered 2018-02-16: 2 [IU] via SUBCUTANEOUS

## 2018-02-15 MED ORDER — DOCUSATE SODIUM 50 MG/5ML PO LIQD: 100.0000 mg | Freq: Two times a day (BID) | ORAL | Status: DC | PRN

## 2018-02-15 MED ORDER — FENTANYL CITRATE (PF) 100 MCG/2ML IJ SOLN
50.0000 ug | INTRAMUSCULAR | Status: AC | PRN
  Administered 2018-02-15 (×3): 50 ug via INTRAVENOUS
  Filled 2018-02-15 (×3): qty 2

## 2018-02-15 MED ORDER — SODIUM BICARBONATE 8.4 % IV SOLN
100.0000 meq | Freq: Once | INTRAVENOUS | Status: AC
  Administered 2018-02-16: 100 meq via INTRAVENOUS
  Filled 2018-02-15: qty 100

## 2018-02-15 MED ORDER — CHLORHEXIDINE GLUCONATE 0.12% ORAL RINSE (MEDLINE KIT)
15.0000 mL | Freq: Two times a day (BID) | OROMUCOSAL | Status: DC
  Administered 2018-02-15 – 2018-02-16 (×2): 15 mL via OROMUCOSAL

## 2018-02-15 MED ORDER — SODIUM BICARBONATE 8.4 % IV SOLN
INTRAVENOUS | Status: DC
  Administered 2018-02-15 – 2018-02-16 (×2): via INTRAVENOUS
  Filled 2018-02-15 (×5): qty 150

## 2018-02-15 MED ORDER — MILRINONE LACTATE IN DEXTROSE 20-5 MG/100ML-% IV SOLN
0.2500 ug/kg/min | INTRAVENOUS | Status: DC
  Administered 2018-02-15: 0.25 ug/kg/min via INTRAVENOUS
  Filled 2018-02-15: qty 100

## 2018-02-15 MED ORDER — FUROSEMIDE 10 MG/ML IJ SOLN
10.0000 mg/h | INTRAVENOUS | Status: DC
  Administered 2018-02-15: 10 mg/h via INTRAVENOUS
  Filled 2018-02-15: qty 5

## 2018-02-15 MED ORDER — BISACODYL 10 MG RE SUPP: 10.0000 mg | Freq: Every day | RECTAL | Status: DC | PRN

## 2018-02-15 MED ORDER — FENTANYL CITRATE (PF) 100 MCG/2ML IJ SOLN
50.0000 ug | INTRAMUSCULAR | Status: DC | PRN
  Administered 2018-02-16 (×2): 50 ug via INTRAVENOUS
  Filled 2018-02-15 (×2): qty 2

## 2018-02-15 MED ORDER — FUROSEMIDE 10 MG/ML IJ SOLN
100.0000 mg | Freq: Once | INTRAVENOUS | Status: AC
  Administered 2018-02-15: 100 mg via INTRAVENOUS
  Filled 2018-02-15: qty 10

## 2018-02-15 MED ORDER — NOREPINEPHRINE 16 MG/250ML-% IV SOLN
0.0000 ug/min | INTRAVENOUS | Status: DC
  Administered 2018-02-15: 26 ug/min via INTRAVENOUS
  Administered 2018-02-16: 40 ug/min via INTRAVENOUS
  Administered 2018-02-16: 45 ug/min via INTRAVENOUS
  Filled 2018-02-15 (×3): qty 250

## 2018-02-15 MED ORDER — ORAL CARE MOUTH RINSE: 15.0000 mL | OROMUCOSAL | Status: DC

## 2018-02-15 NOTE — Progress Notes (Addendum)
Pharmacy Antibiotic Note  Jody [name] is a 84 y.o. female admitted on 03/02/2018 with sepsis.  Pharmacy has been consulted for zosyn dosing.  Transfer from [facility] - no antibiotics received there per chart review. Scr 2.39 (CrCl 12.9 mL/min). PCT 0.27. LA 6.6. Was recent hospitalized for UTI- was treated with Bactrim.   Plan: Zosyn 3.375 g IV every 12 hours Monitor renal fx, cx results, clinical pic, opportunities for de-escalation    Temp (24hrs), Avg:97.9 F (36.6 C), Min:97.9 F (36.6 C), Max:97.9 F (36.6 C)  Recent Labs  Lab 03/02/2018 1650  LATICACIDVEN 6.6*    CrCl cannot be calculated (Patient's most recent lab result is older than the maximum 21 days allowed.).    Allergies  Allergen Reactions  . Levaquin [Levofloxacin] Other (See Comments)    Patient stated vision became blurry and became weak.    Antimicrobials this admission: Zosyn 11/14 >>   Dose adjustments this admission: N/A  Microbiology results: 11/14 BCx: sent 11/14 UCx: sent  11/14 Sputum: ordered  11/14 MRSA PCR: sent  Thank you for allowing pharmacy to be a part of this patient's care.  Antonietta Jewel, PharmD Clinical Pharmacist  Pager: [phone] Phone: [phone] 02/17/2018 5:52 PM

## 2018-02-15 NOTE — Progress Notes (Signed)
ETT pulled back from 24 cm at lip to 20 cm at lip per XRAY and MD order.

## 2018-02-15 NOTE — H&P (Signed)
NAME:  Jody [name], MRN:  [id number], DOB:  1927/06/29, LOS: 0 ADMISSION DATE:  02/02/2018, CONSULTATION DATE:  02/05/2018 REFERRING MD:  [facility], CHIEF COMPLAINT:  Cardiogenic shock  Brief History   83 year old female presented to [facility] with chest pain and SOB, found to be hypoxic and hypotensive.  Mild elevation in troponin with nonspecific EKG changes.  Failed BiPAP and intubated.  On levophed for BP support.     History of present illness   84 year old female with PMH significant for Afib on Eliquis, CAD, arthritis and frequent UTI.  Presented to [facility]. Patient has been feeling unwell for the past week. She was seen in the ED on Saturday and treated for a urinary tract infection. Over the past few days she has had increasing SOB. He daughter brought her to the ED today. She has had progressive SOB and was placed on BIPAP. Due to the shock state and cool extremities there was concern for cardiogenic shock. The patient was transferred here for further evaluation.  At her baseline she lives independently. Widowed. One daughter and one grand daughter.   Past Medical History  Afib on Eliquis, CAD, UTI, arthritis, neuropathy  Significant Hospital Events   02/12/2018 - Intubation   Consults:  02/05/2018: PCCM   Procedures:  02/05/2018: RIJ CVC   Significant Diagnostic Tests:  CXR: pulmonary edema   Micro Data:  Blood Cx: pending  Urine Cx: pending  Trach asp: pending   Antimicrobials:  Pip/tazo   Interim history/subjective:  Intubated, sedated, on MV  Objective   Temperature 97.9 F (36.6 C).    Vent Mode: PRVC FiO2 (%):  [100 %] 100 % Set Rate:  [14 bmp] 14 bmp Vt Set:  [500 mL] 500 mL PEEP:  [5 cmH20] 5 cmH20 Plateau Pressure:  [21 cmH20] 21 cmH20  No intake or output data in the 24 hours ending 02/03/2018 1637 There were no vitals filed for this visit.  Examination: General:  Critically ill, intubated, sedated, on MV  HEENT: MM cyanotic/ dry, some  JVD, pupils 3/reactive  Neuro: unresponsive, no gag/cough CV: IRIR, weak/ cold peripheral pulses  PULM: even/non-labored on full MV, lungs bilaterally rales/ rhonchi GI: soft, hypoBS  Extremities: cold/cyanotic, no LE edema   Skin: cold, BL LE   Bedside echo: Poor LV function, diminished EPSS   Resolved Hospital Problem list    Assessment & Plan:   Shock- cardiogenic is the working diagnosis, vs less likely septic (normal WBC, afebrile, recent hospitalization for UTI) - BNP 9271, mild troponin leak at [facility] 0.25 -0.37 w EKG showing lateral STD with rate 130's which may be rate related - prior TTE 07/2013 w/normal EF, mild MR, PAP 41 mmHg - unable to perform CTA due to elevated sCr P:  Will insert central line and arterial line Continue levophed for goal MAP > 65 Cardiology consulted Assess and trend lactate, troponin, EKG Assess CVP and co-ox after CVL placement  Assess TTE, hopefully when rate improved  Acute hypoxic respiratory failure P:  Full MV support- PRVC 8 cc/kg, rate 14 CXR and ABG now Wean PEEP / FiO2 for goal > 92%  Afib with RVR on eliquis  P: Pending EKG now  Will use amio gtt for rate control given pressor requirements Hold Eliquis Once Head CT complete can likely start heparin  Acute encephalopathy secondary to above  -was following commands at [facility], received fentanyl and vecuronium prior to transport, could be residual  P:  If no  improvement will check CTH Frequent neuro checks  Head CT pending   AKI  P:  Repeat BMET / mag/ phos now Insert foley  UTI this past week, treated per patients daughter  P: Will check cultures  Start empiric pip/tazo   Best practice:  Diet: NPO Pain/Anxiety/Delirium protocol (if indicated): prn fentanyl, need to assess mental status VAP protocol (if indicated): yes DVT prophylaxis: eliquis prior to, will need heparin gtt  GI prophylaxis: protonix per tube Glucose control: SSI / sensitive  Mobility: BR Code Status:  Full on arrival  Family Communication: Dr. Valeta Harms updated family. Disposition: ICU  Labs    Labs from OSH:  Troponin 0.25 BNP 9K   WBC 8.1, Hgb 12, PLT 164  Na 138 K 4.9 Scr 1.8  INR 1.1   __________________________  CBC: No results for input(s): WBC, NEUTROABS, HGB, HCT, MCV, PLT in the last 168 hours.  Basic Metabolic Panel: No results for input(s): NA, K, CL, CO2, GLUCOSE, BUN, CREATININE, CALCIUM, MG, PHOS in the last 168 hours. GFR: CrCl cannot be calculated (Patient's most recent lab result is older than the maximum 21 days allowed.). No results for input(s): PROCALCITON, WBC, LATICACIDVEN in the last 168 hours.  Liver Function Tests: No results for input(s): AST, ALT, ALKPHOS, BILITOT, PROT, ALBUMIN in the last 168 hours. No results for input(s): LIPASE, AMYLASE in the last 168 hours. No results for input(s): AMMONIA in the last 168 hours.  ABG    Component Value Date/Time   TCO2 27 12/21/2009 1742     Coagulation Profile: No results for input(s): INR, PROTIME in the last 168 hours.  Cardiac Enzymes: No results for input(s): CKTOTAL, CKMB, CKMBINDEX, TROPONINI in the last 168 hours.  HbA1C: No results found for: HGBA1C  CBG: No results for input(s): GLUCAP in the last 168 hours.  Review of Systems:   Unable to be obtained secondary to critical illness. Intubated and mechanically ventilated   Past Medical History  She,  has a past medical history of Afib ([place]), Anemia, Aortic stenosis, Arthritis, CAD (coronary artery disease), Chronic diastolic heart failure ([place]), Dyspnea, HTN (hypertension), LBBB (left bundle branch block), Mitral regurgitation, Pulmonary hypertension ([place]), and Varicose veins of both lower extremities.   Surgical History    Past Surgical History:  Procedure Laterality Date  . CATARACT EXTRACTION Bilateral   . JOINT REPLACEMENT Right Feb 2015   Dr. Case- Ledell Noss  . TOTAL HIP ARTHROPLASTY Left 08/01/2017   Procedure: LEFT TOTAL HIP  ARTHROPLASTY ANTERIOR APPROACH;  Surgeon: Mcarthur Rossetti, MD;  Location: [facility];  Service: Orthopedics;  Laterality: Left;  . TOTAL KNEE ARTHROPLASTY Left   . varicose vein laser ablation Bilateral      Social History   reports that she has never smoked. She has never used smokeless tobacco. She reports that she does not drink alcohol or use drugs.   Family History   Her family history includes Diabetes in her mother. There is no history of Amblyopia, Blindness, Cataracts, Glaucoma, Macular degeneration, Retinal detachment, or Retinitis pigmentosa.   Allergies Allergies  Allergen Reactions  . Levaquin [Levofloxacin] Other (See Comments)    Patient stated vision became blurry and became weak.     Home Medications  Prior to Admission medications   Medication Sig Start Date End Date Taking? Authorizing Provider  acetaminophen (TYLENOL) 500 MG tablet Take 500 mg by mouth daily as needed for moderate pain.    [provider]  apixaban (ELIQUIS) 2.5 MG TABS tablet Take  1 tablet (2.5 mg total) by mouth 2 (two) times daily. 11/27/17   Deboraha Sprang, MD  cyproheptadine (PERIACTIN) 2 MG/5ML syrup Take 5 mLs (2 mg total) by mouth every 8 (eight) hours for 7 days, THEN 10 mLs (4 mg total) every 8 (eight) hours. Patient not taking: Reported on 07/19/2017 04/10/17 05/17/17  Sharion Balloon, FNP  cyproheptadine (PERIACTIN) 2 MG/5ML syrup  06/26/17   [provider]  diltiazem (CARTIA XT) 240 MG 24 hr capsule Take 1 capsule (240 mg total) by mouth daily. 07/20/17   Evelina Dun A, FNP  ferrous sulfate 325 (65 FE) MG tablet Take 1 tablet (325 mg total) by mouth daily. 07/20/17   Sharion Balloon, FNP  furosemide (LASIX) 20 MG tablet Take 1 tablet (20 mg total) by mouth daily as needed for edema. 07/20/17   Sharion Balloon, FNP  traMADol (ULTRAM) 50 MG tablet Take 1-2 tablets (50-100 mg total) by mouth every 6 (six) hours as needed. 08/03/17   Mcarthur Rossetti, MD  vitamin  B-12 (CYANOCOBALAMIN) 1000 MCG tablet Take 1,000 mcg by mouth daily.    [provider]   This patient is critically ill with multiple organ system failure; which, requires frequent high complexity decision making, assessment, support, evaluation, and titration of therapies. This was completed through the application of advanced monitoring technologies and extensive interpretation of multiple databases. During this encounter critical care time was devoted to patient care services described in this note for 42 minutes.    Garner Nash, DO [facility] 02/03/2018 5:56 PM  Personal pager: [phone] If unanswered, please page CCM On-call: [phone]

## 2018-02-15 NOTE — Progress Notes (Signed)
ANTICOAGULATION CONSULT NOTE - Initial Consult  Pharmacy Consult for heparin Indication: chest pain/ACS  Allergies  Allergen Reactions  . Levaquin [Levofloxacin] Other (See Comments)    Patient stated vision became blurry and became weak.    Patient Measurements: Height: '5\' 2"'$  (157.5 cm) Weight: 119 lb 0.8 oz (54 kg) IBW/kg (Calculated) : 50.1 Heparin Dosing Weight: 54 kg  Vital Signs: Temp: 97 F (36.1 C) (11/14 1845) BP: 103/83 (11/14 1745) Pulse Rate: 118 (11/14 1845)  Labs: Recent Labs    02/05/2018 1650  CREATININE 2.39*  TROPONINI 3.09*    Estimated Creatinine Clearance: 12.9 mL/min (A) (by C-G formula based on SCr of 2.39 mg/dL (H)).   Medical History: Past Medical History:  Diagnosis Date  . Afib ([place])   . Anemia   . Aortic stenosis    moderate by 07/19/17 echo  . Arthritis   . CAD (coronary artery disease)   . Chronic diastolic heart failure ([place])   . Dyspnea   . HTN (hypertension)   . LBBB (left bundle branch block)   . Mitral regurgitation    Moderate, echo, 07/2012  . Pulmonary hypertension ([place])   . Varicose veins of both lower extremities     Medications:  Scheduled:  . chlorhexidine gluconate (MEDLINE KIT)  15 mL Mouth Rinse BID  . chlorhexidine gluconate (MEDLINE KIT)  15 mL Mouth Rinse BID  . insulin aspart  0-9 Units Subcutaneous Q4H  . mouth rinse  15 mL Mouth Rinse 10 times per day  . mouth rinse  15 mL Mouth Rinse 10 times per day  . pantoprazole sodium  40 mg Per Tube Daily    Assessment: Jody [name] with history of Afib (on apixaban PTA - last dose AM of 11/14 per daughter) - transfer from [facility] with CP/SOB. Trop increased to 3.09 here.   Plan to transition from apixaban to heparin pending head CT - still awaiting ability to get scan tonight. Will monitor both aPTT + heparin level given recent apixaban until correlate. Baseline aPTT 35. No s/sx of bleeding.   Goal of Therapy:  Heparin level 0.3-0.7 units/ml aPTT 66-102  seconds Monitor platelets by anticoagulation protocol: Yes   Plan:  Follow up on head CT results If clear, start heparin infusion Monitor daily HL, CBC, and for s/sx of bleeding once on heparin infusion  Antonietta Jewel, PharmD Clinical Pharmacist  Pager: [phone] Phone: [phone] 02/13/2018,7:05 PM

## 2018-02-15 NOTE — Procedures (Signed)
Arterial Catheter Insertion Procedure Note HEDAYA LATENDRESSE [id number] April 18, 1927  Procedure: Insertion of Arterial Catheter  Indications: Blood pressure monitoring and Frequent blood sampling  Procedure Details Consent: Unable to obtain consent because of emergent medical necessity. Time Out: Verified patient identification, verified procedure, site/side was marked, verified correct patient position, special equipment/implants available, medications/allergies/relevent history reviewed, required imaging and test results available.  Performed  Maximum sterile technique was used including antiseptics, cap, gloves, gown, hand hygiene and mask. Skin prep: Chlorhexidine; local anesthetic administered 20 gauge catheter was inserted into right radial artery using the Seldinger technique. ULTRASOUND GUIDANCE USED: NO Evaluation Blood flow good; BP tracing good. Complications: No apparent complications.   Mcneil Sober 02/14/2018

## 2018-02-15 NOTE — Progress Notes (Signed)
eLink Physician-Brief Progress Note Patient Name: Jody [name] DOB: 1928-03-01 MRN: [id number]   Date of Service  02/08/2018  HPI/Events of Note  Patient remains hypotensive on maximum norepinephrine, acidotic and without response to furosemide.  eICU Interventions  Will start milrinone in an attempt to improve cardiac output and hopefully renal perfusion but may be needing renal replacement soon.  Discuss with bedside team.     Intervention Category Major Interventions: Hypotension - evaluation and management Intermediate Interventions: Electrolyte abnormality - evaluation and management  Judd Lien 02/17/2018, 11:42 PM

## 2018-02-15 NOTE — Procedures (Signed)
Central Venous Catheter Insertion Procedure Note Jody [name] [id number] 1927/04/20  Procedure: Insertion of Central Venous Catheter Indications: Assessment of intravascular volume, Drug and/or fluid administration and Frequent blood sampling  Procedure Details Consent: Risks of procedure as well as the alternatives and risks of each were explained to the (patient/caregiver).  Consent for procedure obtained. Time Out: Verified patient identification, verified procedure, site/side was marked, verified correct patient position, special equipment/implants available, medications/allergies/relevent history reviewed, required imaging and test results available.  Performed  Maximum sterile technique was used including antiseptics, cap, gloves, gown, hand hygiene, mask and sheet. Skin prep: Chlorhexidine; local anesthetic administered A antimicrobial bonded/coated triple lumen catheter was placed in the right internal jugular vein using the Seldinger technique to 18 cm.  Line sutured.  Biopatch patch placed and sterile dressing applied.   Evaluation Blood flow good Complications: No apparent complications Patient did tolerate procedure well. Chest X-ray ordered to verify placement.  CXR: pending.  Procedure performed with ultrasound guidance for real time vessel cannulation.     Kennieth Rad, AGACNP-BC [facility] Pgr: [phone] or if no answer [phone] 02/19/2018, 5:29 PM

## 2018-02-15 NOTE — Consult Note (Addendum)
Cardiology Consultation:   Patient ID: Jody [name] MRN: [id number]; DOB: 10/29/1927  Admit date: 02/08/2018 Date of Consult: 02/23/2018  Primary Care Provider: Sharion Balloon, FNP Primary Cardiologist: Dr. Caryl Comes   Patient Profile:   Jody [name] is a 84 y.o. female with a hx of permament atrial fibrillation, moderate AS, s/p left great saphenous vein ablation on 07/13/2017, LBBB, HTN, and chronic diastolic CHF who is being seen today for the evaluation of Elevated troponin at the request of Dr. Valeta Harms.   Long standing hx of afib. She has history of GI bleed while on Pradaxa requiring transfusions complicated by NSTEM 05/5850. Lexi scan Cardiolite 07/2012 no evidence of scar or ischemia EF 60%. She was kept off Pradaxa and placed on apixaban.  Last seen by Dr. Caryl Comes 06/23/17 for pre-op clearance>  Echo 07/19/17 Study Conclusions  - Left ventricle: The cavity size was normal. Wall thickness was   increased in a pattern of mild LVH. Systolic function was normal.   The estimated ejection fraction was in the range of 55% to 60%.   Septal motion consistent with left bundle branch block. Wall   motion was normal; there were no regional wall motion   abnormalities. The study was not technically sufficient to allow   evaluation of LV diastolic dysfunction due to atrial   fibrillation. - Aortic valve: Moderately calcified annulus. Trileaflet; mildly   calcified leaflets. There was overall moderate stenosis with   relatively low gradients. There was trivial regurgitation. Mean   gradient (S): 14 mm Hg. Peak gradient (S): 27 mm Hg. VTI ratio of   LVOT to aortic valve: 0.44. Valve area (VTI): 1.24 cm^2. - Mitral valve: Moderately calcified annulus. Mildly thickened   leaflets . There was moderate regurgitation. - Left atrium: The atrium was severely dilated. - Right atrium: The atrium was mildly dilated. Central venous   pressure (est): 3 mm Hg. - Atrial septum: No defect or patent  foramen ovale was identified. - Tricuspid valve: There was moderate-severe regurgitation. - Pulmonary arteries: PA peak pressure: 55 mm Hg (S). - Pericardium, extracardiac: There was no pericardial effusion.  History of Present Illness:   [name] presented to [facility] with chest pain and SOB. Noted hypoxic and hypotension. Failed BiPAP and intubated. Transferred here. On levophed.  On Eliquis for anticoagulation. No family at bedside. Scr of 1.8. Troponin minimally elevated. Pending labs here. Hx obtained from review of chart.   Apparently she was in ER at [facility] for chest pain ruled out and sent home.   Past Medical History:  Diagnosis Date  . Afib ([place])   . Anemia   . Aortic stenosis    moderate by 07/19/17 echo  . Arthritis   . CAD (coronary artery disease)   . Chronic diastolic heart failure ([place])   . Dyspnea   . HTN (hypertension)   . LBBB (left bundle branch block)   . Mitral regurgitation    Moderate, echo, 07/2012  . Pulmonary hypertension ([place])   . Varicose veins of both lower extremities     Past Surgical History:  Procedure Laterality Date  . CATARACT EXTRACTION Bilateral   . JOINT REPLACEMENT Right Feb 2015   Dr. Case- Ledell Noss  . TOTAL HIP ARTHROPLASTY Left 08/01/2017   Procedure: LEFT TOTAL HIP ARTHROPLASTY ANTERIOR APPROACH;  Surgeon: Mcarthur Rossetti, MD;  Location: [facility];  Service: Orthopedics;  Laterality: Left;  . TOTAL KNEE ARTHROPLASTY Left   . varicose vein laser ablation Bilateral  Inpatient Medications: Scheduled Meds: . chlorhexidine gluconate (MEDLINE KIT)  15 mL Mouth Rinse BID  . insulin aspart  0-9 Units Subcutaneous Q4H  . mouth rinse  15 mL Mouth Rinse 10 times per day  . pantoprazole sodium  40 mg Per Tube Daily   Continuous Infusions: . norepinephrine (LEVOPHED) Adult infusion     PRN Meds: bisacodyl, docusate, fentaNYL (SUBLIMAZE) injection, fentaNYL (SUBLIMAZE) injection, ipratropium-albuterol  Allergies:    Allergies    Allergen Reactions  . Levaquin [Levofloxacin] Other (See Comments)    Patient stated vision became blurry and became weak.    Social History:   Social History   Socioeconomic History  . Marital status: Widowed    Spouse name: Not on file  . Number of children: Not on file  . Years of education: Not on file  . Highest education level: Not on file  Occupational History  . Not on file  Social Needs  . Financial resource strain: Not on file  . Food insecurity:    Worry: Not on file    Inability: Not on file  . Transportation needs:    Medical: Not on file    Non-medical: Not on file  Tobacco Use  . Smoking status: Never Smoker  . Smokeless tobacco: Never Used  Substance and Sexual Activity  . Alcohol use: No  . Drug use: No  . Sexual activity: Not on file  Lifestyle  . Physical activity:    Days per week: Not on file    Minutes per session: Not on file  . Stress: Not on file  Relationships  . Social connections:    Talks on phone: Not on file    Gets together: Not on file    Attends religious service: Not on file    Active member of club or organization: Not on file    Attends meetings of clubs or organizations: Not on file    Relationship status: Not on file  . Intimate partner violence:    Fear of current or ex partner: Not on file    Emotionally abused: Not on file    Physically abused: Not on file    Forced sexual activity: Not on file  Other Topics Concern  . Not on file  Social History Narrative  . Not on file    Family History:   Family History  Problem Relation Age of Onset  . Diabetes Mother   . Amblyopia Neg Hx   . Blindness Neg Hx   . Cataracts Neg Hx   . Glaucoma Neg Hx   . Macular degeneration Neg Hx   . Retinal detachment Neg Hx   . Retinitis pigmentosa Neg Hx      ROS:  Please see the history of present illness.  All other ROS reviewed and negative.     Physical Exam/Data:   Vitals:   02/06/2018 1656  Temp: 97.9 F (36.6 C)   No  intake or output data in the 24 hours ending 02/07/2018 1726 There were no vitals filed for this visit. There is no height or weight on file to calculate BMI.   General:  Elderly female intubated, critically ill appearing HEENT: normal Lymph: no adenopathy Neck: no JVD Endocrine:  No thryomegaly Vascular: No carotid bruits; FA pulses 2+ bilaterally without bruits  Cardiac:  normal S1, S2; Ir IR tachycardic ; no murmur  Lungs:  clear to auscultation bilaterally, no wheezing, rhonchi or rales  Abd: soft, nontender, no hepatomegaly  Ext: pale cyanotic  legs, no edema  Musculoskeletal:  No deformities, BUE and BLE strength normal and equal Skin: warm and dry  Neuro:  Unresponsive Psych:  Intubated   EKG:  The EKG was personally reviewed and demonstrates:  Atrial fibrillation at rate of 133 bpm Telemetry:  Telemetry was personally reviewed and demonstrates:  afib at 120s  Relevant CV Studies: As above  Laboratory Data:  Pending labs  Radiology/Studies:  Dg Chest Port 1 View  Result Date: 02/14/2018 CLINICAL DATA:  ETT placement. Hx of afib, aortic stenosis, CAD, chronic diastolic heart failure, hypertension, left bundle branch block, mitral regurgitation, pulmonary hypertension. Nonsmoker. EXAM: PORTABLE CHEST 1 VIEW COMPARISON:  02/14/2018 1433 hours FINDINGS: Endotracheal tube is only 7 mm above the carina. Wall not overtly malposition, I do recommend retraction of 2.0 cm for safety purposes. Another wire projects over the upper chest and could be a esophageal temperature probe. Suspected nasogastric tube extending into the stomach where it may be coiling. Moderate enlargement of the cardiopericardial silhouette with interstitial pulmonary edema and some faint perihilar airspace edema, improved from the earlier exam. IMPRESSION: 1. Improved edema.  Moderate cardiomegaly. 2. Endotracheal tube tip is only 7 mm above the carina. Although not overtly malpositioned, I do recommend retraction of  2 cm for safety purposes. These results will be called to the ordering clinician or representative by the Radiologist Assistant, and communication documented in the PACS or zVision Dashboard. Electronically Signed   By: Van Clines M.D.   On: 02/25/2018 17:02    Assessment and Plan:   1. Atrial fibrillation at rapid ventricular rate - Hx of permanent afib. On low dose eliquis for anticoagulation given age,weight and renal function. CHADSVACs score of 5. Start amio for rate control. Start heparin for anticoagulation. Plan for CT of head first. Repeat echocardiogram.   2. Elevated troponin/Chest pain  - BNP 9271, mild troponin leak at [facility] 0.25 -0.37. Cycle troponin. EKG with lateral ST depression. Ischemic eval once recovers.   3. Acute hypoxic respiratory failure - Intubated. Per primary team   4. AKI - Pending labs.   For questions or updates, please contact [place] Please consult [email or web address] for contact info under   Jarrett Soho, [place]  03/02/2018 5:26 PM   The patient was seen, examined and discussed with Bhagat,Bhavinkumar PA-C and I agree with the above.   84 y.o. female with a hx of permament atrial fibrillation on chronic anticoagulation with Eliquis, moderate AS, s/p left great saphenous vein ablation on 07/13/2017, LBBB, HTN, and chronic diastolic CHF, patient of Dr. Caryl Comes last seen in March 2019 for preoperative clearance when her LVEF was 55 to 60%.  The patient is fully independent living alone, she went to the ER at [facility] on 02/10/2018 with chest pain was rule out for MI and discharged home.  She developed acute onset chest pain radiating to her neck and associated with a headache this morning, she went back to the ER where she was found to be in cardiogenic shock, hypotensive, hypoxic started on levophed, echocardiogram showed LVEF of 30 to 35%, she was originally on BiPAP but failed and was eventually intubated.  She was transferred to [facility] for further therapy. On arrival she remains on levophd at 23 mcg/kg/min, blood pressure is 103/64, she is in A. fib with RVR and ventricular rate 110 to 120 bpm. She is somnolent post sedation given in the ambulance, she does not have dilated pupils, she does have elevated JVDs, crackles in  her lungs, her peripheral pulses are hard to palpate in her extremities are very cool.  EKG shows atrial fibrillation with left bundle branch block and RVR, otherwise unchanged EKG from prior. Initial labs show creatinine of 2.39, baseline 0.99, severely elevated LFTs, AST 1085, ALT 885, troponin is 3, lactic acid 6.6, hemoglobin is pending.  Normal potassium and sodium.  Assessment and plan: Non-STEMI complicated by cardiogenic shock Permanent atrial fibrillation now with RVR Acute respiratory failure  Start amiodarone drip, head CT is pending if negative for bleeding start heparin drip for non-STEMI and atrial fibrillation. Obtain echocardiogram, given multi organ failure including heart kidneys, liver and lungs, patient's age and severely elevated lactic acid her prognosis is very poor.  If she improves and gets extubated we will consider cardiac catheterization.  Ena Dawley, MD 02/24/2018

## 2018-02-16 ENCOUNTER — Other Ambulatory Visit (HOSPITAL_COMMUNITY): Payer: Medicare Other

## 2018-02-16 ENCOUNTER — Inpatient Hospital Stay (HOSPITAL_COMMUNITY): Payer: Medicare Other

## 2018-02-16 ENCOUNTER — Encounter (HOSPITAL_COMMUNITY): Admission: AD | Disposition: E | Payer: Self-pay | Source: Other Acute Inpatient Hospital | Attending: Pulmonary Disease

## 2018-02-16 DIAGNOSIS — N170 Acute kidney failure with tubular necrosis: Secondary | ICD-10-CM

## 2018-02-16 DIAGNOSIS — E872 Acidosis: Secondary | ICD-10-CM

## 2018-02-16 DIAGNOSIS — J9601 Acute respiratory failure with hypoxia: Secondary | ICD-10-CM

## 2018-02-16 DIAGNOSIS — Z9911 Dependence on respirator [ventilator] status: Secondary | ICD-10-CM

## 2018-02-16 DIAGNOSIS — I214 Non-ST elevation (NSTEMI) myocardial infarction: Secondary | ICD-10-CM

## 2018-02-16 LAB — POCT I-STAT 3, ART BLOOD GAS (G3+)
ACID-BASE DEFICIT: 3 mmol/L — AB (ref 0.0–2.0)
Acid-Base Excess: 5 mmol/L — ABNORMAL HIGH (ref 0.0–2.0)
BICARBONATE: 24 mmol/L (ref 20.0–28.0)
Bicarbonate: 21.1 mmol/L (ref 20.0–28.0)
Bicarbonate: 30.6 mmol/L — ABNORMAL HIGH (ref 20.0–28.0)
O2 SAT: 96 %
O2 SAT: 96 %
O2 Saturation: 97 %
PCO2 ART: 35.5 mmHg (ref 32.0–48.0)
PO2 ART: 76 mmHg — AB (ref 83.0–108.0)
Patient temperature: 37.1
TCO2: 22 mmol/L (ref 22–32)
TCO2: 25 mmol/L (ref 22–32)
TCO2: 32 mmol/L (ref 22–32)
pCO2 arterial: 33 mmHg (ref 32.0–48.0)
pCO2 arterial: 49.8 mmHg — ABNORMAL HIGH (ref 32.0–48.0)
pH, Arterial: 7.397 (ref 7.350–7.450)
pH, Arterial: 7.414 (ref 7.350–7.450)
pH, Arterial: 7.437 (ref 7.350–7.450)
pO2, Arterial: 83 mmHg (ref 83.0–108.0)
pO2, Arterial: 88 mmHg (ref 83.0–108.0)

## 2018-02-16 LAB — BASIC METABOLIC PANEL
Anion gap: 21 — ABNORMAL HIGH (ref 5–15)
BUN: 35 mg/dL — AB (ref 8–23)
CHLORIDE: 98 mmol/L (ref 98–111)
CO2: 20 mmol/L — ABNORMAL LOW (ref 22–32)
Calcium: 7.3 mg/dL — ABNORMAL LOW (ref 8.9–10.3)
Creatinine, Ser: 3.24 mg/dL — ABNORMAL HIGH (ref 0.44–1.00)
GFR calc Af Amer: 14 mL/min — ABNORMAL LOW (ref >60)
GFR, EST NON AFRICAN AMERICAN: 12 mL/min — AB (ref >60)
GLUCOSE: 222 mg/dL — AB (ref 70–99)
POTASSIUM: 3.7 mmol/L (ref 3.5–5.1)
Sodium: 139 mmol/L (ref 135–145)

## 2018-02-16 LAB — CBC
HCT: 39.7 % (ref 36.0–46.0)
Hemoglobin: 12.8 g/dL (ref 12.0–15.0)
MCH: 30.9 pg (ref 26.0–34.0)
MCHC: 32.2 g/dL (ref 30.0–36.0)
MCV: 95.9 fL (ref 80.0–100.0)
PLATELETS: 185 10*3/uL (ref 150–400)
RBC: 4.14 MIL/uL (ref 3.87–5.11)
RDW: 14.2 % (ref 11.5–15.5)
WBC: 11.6 10*3/uL — ABNORMAL HIGH (ref 4.0–10.5)
nRBC: 0 % (ref 0.0–0.2)

## 2018-02-16 LAB — MAGNESIUM: Magnesium: 1.8 mg/dL (ref 1.7–2.4)

## 2018-02-16 LAB — TROPONIN I
TROPONIN I: 14.89 ng/mL — AB (ref <0.03)
Troponin I: 24.12 ng/mL (ref <0.03)

## 2018-02-16 LAB — COOXEMETRY PANEL
Carboxyhemoglobin: 0.7 % (ref 0.5–1.5)
Methemoglobin: 1.6 % — ABNORMAL HIGH (ref 0.0–1.5)
O2 Saturation: 49.4 %
Total hemoglobin: 13.3 g/dL (ref 12.0–16.0)

## 2018-02-16 LAB — GLUCOSE, CAPILLARY
GLUCOSE-CAPILLARY: 214 mg/dL — AB (ref 70–99)
GLUCOSE-CAPILLARY: 165 mg/dL — AB (ref 70–99)
Glucose-Capillary: 148 mg/dL — ABNORMAL HIGH (ref 70–99)
Glucose-Capillary: 210 mg/dL — ABNORMAL HIGH (ref 70–99)

## 2018-02-16 LAB — PHOSPHORUS: Phosphorus: 4.4 mg/dL (ref 2.5–4.6)

## 2018-02-16 LAB — LACTIC ACID, PLASMA: Lactic Acid, Venous: 9.4 mmol/L (ref 0.5–1.9)

## 2018-02-16 LAB — HEPARIN LEVEL (UNFRACTIONATED): Heparin Unfractionated: >2.2 IU/mL — ABNORMAL HIGH (ref 0.30–0.70)

## 2018-02-16 LAB — APTT: aPTT: 66 seconds — ABNORMAL HIGH (ref 24–36)

## 2018-02-16 SURGERY — Surgical Case

## 2018-02-16 MED ORDER — SODIUM CHLORIDE 0.9 % WEIGHT BASED INFUSION: 1.0000 mL/kg/h | INTRAVENOUS | Status: DC

## 2018-02-16 MED ORDER — SODIUM CHLORIDE 0.9% FLUSH: 10.0000 mL | INTRAVENOUS | Status: DC | PRN

## 2018-02-16 MED ORDER — MORPHINE BOLUS VIA INFUSION
5.0000 mg | INTRAVENOUS | Status: DC | PRN
  Filled 2018-02-16: qty 5

## 2018-02-16 MED ORDER — SODIUM CHLORIDE 0.9% FLUSH: 3.0000 mL | Freq: Two times a day (BID) | INTRAVENOUS | Status: DC

## 2018-02-16 MED ORDER — HEPARIN (PORCINE) 25000 UT/250ML-% IV SOLN
850.0000 [IU]/h | INTRAVENOUS | Status: DC
  Administered 2018-02-16: 800 [IU]/h via INTRAVENOUS
  Filled 2018-02-16: qty 250

## 2018-02-16 MED ORDER — ACETAMINOPHEN 650 MG RE SUPP: 650.0000 mg | Freq: Four times a day (QID) | RECTAL | Status: DC | PRN

## 2018-02-16 MED ORDER — POLYVINYL ALCOHOL 1.4 % OP SOLN
1.0000 [drp] | Freq: Four times a day (QID) | OPHTHALMIC | Status: DC | PRN
  Filled 2018-02-16: qty 15

## 2018-02-16 MED ORDER — ASPIRIN 81 MG PO CHEW: 81.0000 mg | CHEWABLE_TABLET | ORAL | Status: DC

## 2018-02-16 MED ORDER — PIPERACILLIN-TAZOBACTAM IN DEX 2-0.25 GM/50ML IV SOLN
2.2500 g | Freq: Four times a day (QID) | INTRAVENOUS | Status: DC
  Filled 2018-02-16 (×2): qty 50

## 2018-02-16 MED ORDER — SODIUM CHLORIDE 0.9 % IV SOLN: 250.0000 mL | INTRAVENOUS | Status: DC | PRN

## 2018-02-16 MED ORDER — HEPARIN SODIUM (PORCINE) 1000 UNIT/ML DIALYSIS
1000.0000 [IU] | INTRAMUSCULAR | Status: DC | PRN
  Filled 2018-02-16: qty 6

## 2018-02-16 MED ORDER — PRISMASOL BGK 4/2.5 32-4-2.5 MEQ/L IV SOLN
INTRAVENOUS | Status: DC
  Filled 2018-02-16 (×5): qty 5000

## 2018-02-16 MED ORDER — SODIUM CHLORIDE 0.9% FLUSH: 3.0000 mL | INTRAVENOUS | Status: DC | PRN

## 2018-02-16 MED ORDER — MORPHINE 100MG IN NS 100ML (1MG/ML) PREMIX INFUSION
0.0000 mg/h | INTRAVENOUS | Status: DC
  Administered 2018-02-16: 15 mg/h via INTRAVENOUS
  Filled 2018-02-16: qty 100

## 2018-02-16 MED ORDER — GLYCOPYRROLATE 1 MG PO TABS
1.0000 mg | ORAL_TABLET | ORAL | Status: DC | PRN
  Filled 2018-02-16: qty 1

## 2018-02-16 MED ORDER — LORAZEPAM 2 MG/ML IJ SOLN
2.0000 mg | INTRAMUSCULAR | Status: DC | PRN
  Filled 2018-02-16: qty 2

## 2018-02-16 MED ORDER — AMIODARONE HCL IN DEXTROSE 360-4.14 MG/200ML-% IV SOLN
60.0000 mg/h | INTRAVENOUS | Status: AC
  Administered 2018-02-16: 60 mg/h via INTRAVENOUS
  Filled 2018-02-16: qty 200

## 2018-02-16 MED ORDER — SODIUM CHLORIDE 0.9% FLUSH: 10.0000 mL | Freq: Two times a day (BID) | INTRAVENOUS | Status: DC

## 2018-02-16 MED ORDER — ACETAMINOPHEN 325 MG PO TABS: 650.0000 mg | ORAL_TABLET | Freq: Four times a day (QID) | ORAL | Status: DC | PRN

## 2018-02-16 MED ORDER — DEXTROSE 5 % IV SOLN
INTRAVENOUS | Status: DC
  Administered 2018-02-16: 16:00:00 via INTRAVENOUS

## 2018-02-16 MED ORDER — GLYCOPYRROLATE 0.2 MG/ML IJ SOLN: 0.2000 mg | INTRAMUSCULAR | Status: DC | PRN

## 2018-02-16 MED ORDER — PRISMASOL BGK 4/2.5 32-4-2.5 MEQ/L REPLACEMENT SOLN
Status: DC
  Filled 2018-02-16 (×2): qty 5000

## 2018-02-16 MED ORDER — DIPHENHYDRAMINE HCL 50 MG/ML IJ SOLN: 25.0000 mg | INTRAMUSCULAR | Status: DC | PRN

## 2018-02-16 MED ORDER — HEPARIN (PORCINE) 2000 UNITS/L FOR CRRT
INTRAVENOUS_CENTRAL | Status: DC | PRN
  Filled 2018-02-16: qty 1000

## 2018-02-16 MED ORDER — AMIODARONE HCL IN DEXTROSE 360-4.14 MG/200ML-% IV SOLN: 30.0000 mg/h | INTRAVENOUS | Status: DC

## 2018-02-16 MED ORDER — CHLORHEXIDINE GLUCONATE CLOTH 2 % EX PADS
6.0000 | MEDICATED_PAD | Freq: Every day | CUTANEOUS | Status: DC
  Administered 2018-02-16: 6 via TOPICAL

## 2018-02-16 MED ORDER — MORPHINE SULFATE (PF) 2 MG/ML IV SOLN: 2.0000 mg | INTRAVENOUS | Status: DC | PRN

## 2018-02-16 MED ORDER — SODIUM BICARBONATE 8.4 % IV SOLN
50.0000 meq | Freq: Once | INTRAVENOUS | Status: AC
  Administered 2018-02-16: 50 meq via INTRAVENOUS

## 2018-02-16 MED ORDER — VERAPAMIL HCL 2.5 MG/ML IV SOLN
INTRAVENOUS | Status: AC
  Filled 2018-02-16: qty 2

## 2018-02-16 MED ORDER — FENTANYL 2500MCG IN NS 250ML (10MCG/ML) PREMIX INFUSION
25.0000 ug/h | INTRAVENOUS | Status: DC
  Administered 2018-02-16: 50 ug/h via INTRAVENOUS
  Filled 2018-02-16: qty 250

## 2018-02-16 MED ORDER — VASOPRESSIN 20 UNIT/ML IV SOLN
0.0400 [IU]/min | INTRAVENOUS | Status: DC
  Administered 2018-02-16: 0.04 [IU]/min via INTRAVENOUS
  Filled 2018-02-16: qty 2

## 2018-02-16 MED ORDER — PRISMASOL BGK 4/2.5 32-4-2.5 MEQ/L REPLACEMENT SOLN
Status: DC
  Filled 2018-02-16 (×3): qty 5000

## 2018-02-16 MED ORDER — MIDAZOLAM HCL 2 MG/2ML IJ SOLN
2.0000 mg | Freq: Once | INTRAMUSCULAR | Status: AC
  Administered 2018-02-16: 2 mg via INTRAVENOUS

## 2018-02-16 MED ORDER — MIDAZOLAM HCL 2 MG/2ML IJ SOLN
INTRAMUSCULAR | Status: AC
  Administered 2018-02-16: 2 mg via INTRAVENOUS
  Filled 2018-02-16: qty 2

## 2018-02-16 MED ORDER — SODIUM CHLORIDE 0.9 % WEIGHT BASED INFUSION: 3.0000 mL/kg/h | INTRAVENOUS | Status: DC

## 2018-02-18 LAB — CULTURE, RESPIRATORY W GRAM STAIN: Culture: NORMAL

## 2018-02-18 LAB — URINE CULTURE: Culture: >=100000 — AB

## 2018-02-19 MED FILL — Verapamil HCl IV Soln 2.5 MG/ML: INTRAVENOUS | Qty: 2 | Status: CN

## 2018-02-21 LAB — CULTURE, BLOOD (ROUTINE X 2)
CULTURE: NO GROWTH
Culture: NO GROWTH

## 2018-03-04 NOTE — Progress Notes (Signed)
   2018-03-03 1500  Clinical Encounter Type  Visited With Patient and family together;Health care provider  Visit Type Follow-up;Patient actively dying  Referral From Family  Spiritual Encounters  Spiritual Needs Grief support;Emotional;Prayer   Pt's granddaughter had arrived and desired chaplain to pray w/ everyone before pt removed from respiratory support.  Prayed w/ pt, dau, granddau, and granddaughter's fiance.  Myra Gianotti resident, [phone]

## 2018-03-04 NOTE — Progress Notes (Signed)
NAME:  Jody [name], MRN:  [id number], DOB:  10/10/1927, LOS: 1 ADMISSION DATE:  02/28/2018, CONSULTATION DATE:  02/17/2018 REFERRING MD:  Ge[facility], CHIEF COMPLAINT:  Cardiogenic shock  Brief History   84 year old female presented to [facility] with chest pain and SOB, found to be hypoxic and hypotensive.  Mild elevation in troponin with nonspecific EKG changes.  Failed BiPAP and intubated.  On levophed for BP support.     History of present illness   84 year old female with PMH significant for Afib on Eliquis, CAD, arthritis and frequent UTI.  Presented to [facility]. Patient has been feeling unwell for the past week. She was seen in the ED on Saturday and treated for a urinary tract infection. Over the past few days she has had increasing SOB. He daughter brought her to the ED today. She has had progressive SOB and was placed on BIPAP. Due to the shock state and cool extremities there was concern for cardiogenic shock. The patient was transferred here for further evaluation.  At her baseline she lives independently. Widowed. One daughter and one grand daughter.   Past Medical History  Afib on Eliquis, CAD, UTI, arthritis, neuropathy  Significant Hospital Events   02/14/2018 - Intubation   Consults:  02/14/2018: PCCM   Procedures:  02/23/2018: RIJ CVC   Significant Diagnostic Tests:  CXR: pulmonary edema   Micro Data:  Blood Cx: pending  Urine Cx: pending  Trach asp: pending   Antimicrobials:  Pip/tazo   Interim history/subjective:  No urine output overnight, progressively hypotensive requiring up titration of vasopressors.  Initiation of milrinone.  Progressively acidotic placed on bicarbonate drip.  Patient is more awake able to follow basic commands with head nods this morning.  Family at bedside updated.  Care discussed with nursing staff.  Objective   Blood pressure 96/68, pulse (!) 133, temperature 98.8 F (37.1 C), resp. rate (!) 28, height 5\' 2"  (1.575 m), weight  51.9 kg, SpO2 92 %. CVP:  [8 mmHg-16 mmHg] 11 mmHg  Vent Mode: PRVC FiO2 (%):  [40 %-100 %] 80 % Set Rate:  [14 bmp-25 bmp] 25 bmp Vt Set:  [400 mL-500 mL] 400 mL PEEP:  [5 cmH20-8 cmH20] 8 cmH20 Plateau Pressure:  [20 cmH20-24 cmH20] 21 cmH20   Intake/Output Summary (Last 24 hours) at 26-Feb-2018 0920 Last data filed at 02-26-18 0700 Gross per 24 hour  Intake 1740.6 ml  Output 125 ml  Net 1615.6 ml   Filed Weights   02/06/2018 1800 02-26-18 0600  Weight: 54 kg 51.9 kg    Examination: General: Critically ill, intubated, mechanically ventilated, sedated however able to follow some basic commands HEENT: Mucous membranes moist, ET tube in place, lips less cyanotic Neuro: Following basic commands, alert, moving all 4 extremities no focal deficit CV: Irregular, fast, tachycardic S1-S2, heart rate in the 1 30-1 40 sustained PULM: Bilateral ventilated breath sounds, bilateral crackles GI: Soft, nontender, nondistended Extremities: Distal lower extremities are still cold, less cyanotic than yesterday little improved Skin: Distally cyanotic   Resolved Hospital Problem list    Assessment & Plan:   Shock- cardiogenic with acute pulmonary edema, vs less likely septic (normal WBC, afebrile, recent hospitalization for UTI) --Now with significant elevation in troponin, likely NSTEMI -Patient is less cold as yesterday.  Extremities still cool but better. P:  We appreciate cardiology's recommendations. I discussed the case briefly with our advanced heart failure team. Patient currently blood pressure maintained on titrating levo fed to 45  mics, milrinone Will add vasopressin.  Remains significantly tachycardic Echo pending. Continue heparin, however will need to hold for dialysis catheter placement today.  Acute hypoxic respiratory failure requiring intubation and mechanical ventilation secondary to above P:  Patient remains on full vent support PRVC 8 cc/kg, wean FiO2 to maintain sats  greater than 90%.  Afib with RVR on eliquis prior to admission P: Holding patient's PTA Eliquis, continue heparin for the time being  Acute encephalopathy secondary to above  P:  Head CT canceled, doing stable to move last night. Neurologic exam improved was following commands felt comfortable last night starting heparin per the night team.  AKI  P:  Likely mixed cardiorenal syndrome, shock ATN Of consulted nephrology to see the patient this morning. Likely need IHD catheter and initiation of CRRT  UTI this past week, treated per patients daughter  P: Cultures pending, continue piperacillin tazobactam  Best practice:  Diet: NPO Pain/Anxiety/Delirium protocol (if indicated): prn fentanyl, need to assess mental status VAP protocol (if indicated): yes DVT prophylaxis: eliquis prior to, will need heparin gtt  GI prophylaxis: protonix per tube Glucose control: SSI / sensitive  Mobility: BR Code Status: Full on arrival  Family Communication: I spoke with daugther this morning at bedside  Disposition: ICU  Labs    Labs from OSH:  Troponin 0.25 BNP 9K   WBC 8.1, Hgb 12, PLT 164  Na 138 K 4.9 Scr 1.8  INR 1.1   __________________________  CBC: Recent Labs  Lab 03/01/2018 0522  WBC 11.6*  HGB 12.8  HCT 39.7  MCV 95.9  PLT 323    Basic Metabolic Panel: Recent Labs  Lab 02/27/2018 1650 03-01-18 0522  NA 137 139  K 4.9 3.7  CL 107 98  CO2 12* 20*  GLUCOSE 207* 222*  BUN 31* 35*  CREATININE 2.39* 3.24*  CALCIUM 8.5* 7.3*  MG 2.3 1.8  PHOS 8.0* 4.4   GFR: Estimated Creatinine Clearance: 9.5 mL/min (A) (by C-G formula based on SCr of 3.24 mg/dL (H)). Recent Labs  Lab 02/18/2018 1650 2018/03/01 0038 03/01/2018 0522  PROCALCITON 0.27  --   --   WBC  --   --  11.6*  LATICACIDVEN 6.6* 9.4*  --     Liver Function Tests: Recent Labs  Lab 02/14/2018 1650  AST 1,085*  ALT 885*  ALKPHOS 111  BILITOT 1.0  PROT 6.6  ALBUMIN 3.5   No results for input(s):  LIPASE, AMYLASE in the last 168 hours. No results for input(s): AMMONIA in the last 168 hours.  ABG    Component Value Date/Time   PHART 7.414 03-01-2018 0301   PCO2ART 33.0 03-01-18 0301   PO2ART 83.0 03/01/2018 0301   HCO3 21.1 2018/03/01 0301   TCO2 22 2018/03/01 0301   ACIDBASEDEF 3.0 (H) March 01, 2018 0301   O2SAT 96.0 03-01-2018 0301     Coagulation Profile: No results for input(s): INR, PROTIME in the last 168 hours.  Cardiac Enzymes: Recent Labs  Lab 02/05/2018 1650 03-01-2018 0038 03/01/18 0522  TROPONINI 3.09* 14.89* 24.12*    HbA1C: No results found for: HGBA1C  CBG: Recent Labs  Lab 02/13/2018 1659 02/14/2018 2042 02/08/2018 2354  GLUCAP 177* 201* 210*    CO-OX 49%  This patient is critically ill with multiple organ system failure; which, requires frequent high complexity decision making, assessment, support, evaluation, and titration of therapies. This was completed through the application of advanced monitoring technologies and extensive interpretation of multiple databases.  Care coordinated and discussed  with the advanced heart failure team as well as are the urology service.  Also discussed pressor titration and management with nursing staff. During this encounter critical care time was devoted to patient care services described in this note for 42 minutes.   Garner Nash, DO [facility] 03/15/2018 9:20 AM  Personal pager: [phone] If unanswered, please page CCM On-call: [phone]

## 2018-03-04 NOTE — Progress Notes (Signed)
CRITICAL VALUE ALERT  Critical Value:  Lactic acid 9.4  Date & Time Notied:  03/04/18 1:21 AM   Provider Notified: [facility]-Link  Orders Received/Actions taken: no new orders given

## 2018-03-04 NOTE — Progress Notes (Signed)
   2018/03/18 1400  Clinical Encounter Type  Visited With Family;Patient and family together;Health care provider  Visit Type Initial;Patient actively dying;Critical Care  Referral From Family  Spiritual Encounters  Spiritual Needs Prayer;Emotional  Stress Factors  Patient Stress Factors Major life changes;Health changes  Family Stress Factors Major life changes;Loss of control;Family relationships   Called to unit by care nurse at request of pt's daughter.  Compassionate listening to daughter talk about event which precipitated this hospitalization, prayed with daughter and pt.  Pt's granddaughter to arrived around 2:30pm, feel free to page chaplain to return for granddaughter and expected removal of respiratory support.  Myra Gianotti resident, [phone]

## 2018-03-04 NOTE — Progress Notes (Deleted)
Elevated HR.  Will attempt at a later time.

## 2018-03-04 NOTE — Progress Notes (Signed)
Patient had no urine output despite lasix given. Agarwala, MD gave RN verbal order to start lasix drip. See new orders.

## 2018-03-04 NOTE — Progress Notes (Signed)
ANTICOAGULATION CONSULT NOTE - Initial Consult  Pharmacy Consult for heparin Indication: chest pain/ACS and atrial fibrillation  Allergies  Allergen Reactions  . Levaquin [Levofloxacin] Other (See Comments)    Patient stated vision became blurry and became weak.    Patient Measurements: Height: '5\' 2"'$  (157.5 cm) Weight: 119 lb 0.8 oz (54 kg) IBW/kg (Calculated) : 50.1  Vital Signs: Temp: 98.8 F (37.1 C) (11/15 0030) Temp Source: Bladder (11/14 2000) BP: 95/71 (11/14 2300) Pulse Rate: 115 (11/15 0030)  Labs: Recent Labs    02/25/2018 1650 02/18/2018 1841  APTT  --  35  CREATININE 2.39*  --   TROPONINI 3.09*  --     Estimated Creatinine Clearance: 12.9 mL/min (A) (by C-G formula based on SCr of 2.39 mg/dL (H)).   Medical History: Past Medical History:  Diagnosis Date  . Afib ([place])   . Anemia   . Aortic stenosis    moderate by 07/19/17 echo  . Arthritis   . CAD (coronary artery disease)   . Chronic diastolic heart failure ([place])   . Dyspnea   . HTN (hypertension)   . LBBB (left bundle branch block)   . Mitral regurgitation    Moderate, echo, 07/2012  . Pulmonary hypertension ([place])   . Varicose veins of both lower extremities     Medications:  Facility-Administered Medications Prior to Admission  Medication Dose Route Frequency Provider Last Rate Last Dose  . Bevacizumab (AVASTIN) SOLN 1.25 mg  1.25 mg Intravitreal  Bernarda Caffey, MD   1.25 mg at 08/31/17 1242  . Bevacizumab (AVASTIN) SOLN 1.25 mg  1.25 mg Intravitreal  Bernarda Caffey, MD   1.25 mg at 10/01/17 0152  . Bevacizumab (AVASTIN) SOLN 1.25 mg  1.25 mg Intravitreal  Bernarda Caffey, MD   1.25 mg at 10/25/17 1254  . Bevacizumab (AVASTIN) SOLN 1.25 mg  1.25 mg Intravitreal  Bernarda Caffey, MD   1.25 mg at 11/22/17 1216  . Bevacizumab (AVASTIN) SOLN 1.25 mg  1.25 mg Intravitreal  Bernarda Caffey, MD   1.25 mg at 12/27/17 1214  . Bevacizumab (AVASTIN) SOLN 1.25 mg  1.25 mg Intravitreal  Bernarda Caffey, MD   1.25 mg  at 02/07/18 1436   Medications Prior to Admission  Medication Sig Dispense Refill Last Dose  . acetaminophen (TYLENOL) 500 MG tablet Take 500 mg by mouth daily as needed for moderate pain.   Taking  . apixaban (ELIQUIS) 2.5 MG TABS tablet Take 1 tablet (2.5 mg total) by mouth 2 (two) times daily. 180 tablet 1   . cyproheptadine (PERIACTIN) 2 MG/5ML syrup Take 5 mLs (2 mg total) by mouth every 8 (eight) hours for 7 days, THEN 10 mLs (4 mg total) every 8 (eight) hours. (Patient not taking: Reported on 07/19/2017) 120 mL 2 Not Taking at Unknown time  . cyproheptadine (PERIACTIN) 2 MG/5ML syrup   2 Taking  . diltiazem (CARTIA XT) 240 MG 24 hr capsule Take 1 capsule (240 mg total) by mouth daily. 90 capsule 3 Taking  . ferrous sulfate 325 (65 FE) MG tablet Take 1 tablet (325 mg total) by mouth daily. 90 tablet 1 Taking  . furosemide (LASIX) 20 MG tablet Take 1 tablet (20 mg total) by mouth daily as needed for edema. 30 tablet 3 Taking  . traMADol (ULTRAM) 50 MG tablet Take 1-2 tablets (50-100 mg total) by mouth every 6 (six) hours as needed. 60 tablet 0 Taking  . vitamin B-12 (CYANOCOBALAMIN) 1000 MCG tablet Take 1,000 mcg by mouth daily.  Taking   Scheduled:  . chlorhexidine gluconate (MEDLINE KIT)  15 mL Mouth Rinse BID  . chlorhexidine gluconate (MEDLINE KIT)  15 mL Mouth Rinse BID  . insulin aspart  0-9 Units Subcutaneous Q4H  . mouth rinse  15 mL Mouth Rinse 10 times per day  . pantoprazole sodium  40 mg Per Tube Daily    Assessment: 84yo female on Eliquis PTA for Afib to transition to UFH for NSTEMI complicated by cardiogenic shock; pt is mentating well and CCM will defer head CT.  Goal of Therapy:  aPTT 66-102 seconds Monitor platelets by anticoagulation protocol: Yes   Plan:  Will start heparin gtt at 800 units/hr and monitor heparin levels, aPTT (while Eliquis affects anti-Xa), and CBC.  Wynona Neat, PharmD, BCPS  07-Mar-2018,1:05 AM

## 2018-03-04 NOTE — Death Summary Note (Signed)
DEATH SUMMARY   Patient Details  Name: Jody [name] MRN: [id number] DOB: [birth year]  Admission/Discharge Information   Admit Date:  03-12-18  Date of Death: Date of Death: 2018-03-13  Time of Death: Time of Death: Jul 23, 1551  Length of Stay: 1  Referring Physician: Sharion Balloon, FNP   Reason(s) for Hospitalization  Cardiogenic Shock  Diagnoses  Preliminary cause of death:  Secondary Diagnoses (including complications and co-morbidities):  Active Problems:   Shock [facility])   Brief Hospital Course (including significant findings, care, treatment, and services provided and events leading to death)  Jody [name] is a 84 y.o. year old female who PMH significant for Afib on Eliquis, CAD, arthritis and frequent UTI.  Presented to [facility]. Patient has been feeling unwell for the past week. She was seen in the ED on Saturday and treated for a urinary tract infection. Over the past few days she has had increasing SOB. He daughter brought her to the ED today. She has had progressive SOB and was placed on BIPAP. Due to the shock state and cool extremities there was concern for cardiogenic shock. The patient was transferred here for further evaluation. At her baseline she lives independently. Widowed. One daughter and one grand daughter.   The patient was admitted to the ICU. She continued to further decompensate requiring high dose vasopressors. The patient developed worsening renal failure. Ultimately, she needed dialysis to continue. Along with further discussions regarding her severely failing heart the decision was made not to initiate dialysis and the family opted for comfort measures and withdrawal of care.   Pertinent Labs and Studies  Significant Diagnostic Studies Dg Chest Port 1 View  Result Date: 03-13-18 CLINICAL DATA:  Central line placement. EXAM: PORTABLE CHEST 1 VIEW COMPARISON:  Mar 13, 2018. FINDINGS: Interim placement of left IJ dual-lumen catheter, its tip is at  the cavoatrial junction. Endotracheal tube, NG tube, right IJ line in stable position. Stable cardiomegaly. Stable diffuse bilateral pulmonary infiltrates/edema and small bilateral pleural effusions. Chest is stable from prior exam. IMPRESSION: 1. Interim placement of dual-lumen left IJ catheter, its tip is at the cavoatrial junction. Remaining lines and tubes in stable position. No pneumothorax. 2. Stable cardiomegaly. Stable diffuse bilateral pulmonary infiltrates/edema and small bilateral pleural effusions. No interim change from prior exam. Electronically Signed   By: [place]   On: 03/13/2018 11:51   Dg Chest Port 1 View  Result Date: 2018/03/13 CLINICAL DATA:  Intubation. EXAM: PORTABLE CHEST 1 VIEW COMPARISON:  2018/03/12. FINDINGS: Endotracheal tube, NG tube, right IJ line in stable position. Cardiomegaly with diffuse bilateral pulmonary infiltrates consistent pulmonary edema again noted. Small bilateral pleural effusions again noted. IMPRESSION: 1.  Lines and tubes in stable position. 2. Persistent changes of congestive heart failure with bilateral pulmonary edema and small pleural effusions. No interim change from prior exam. Electronically Signed   By: Marcello Moores  Register   On: 2018/03/13 09:41   Dg Chest Port 1 View  Result Date: 03-12-2018 CLINICAL DATA:  Encounter for central line placement. EXAM: PORTABLE CHEST 1 VIEW COMPARISON:  1645 hours on the same day FINDINGS: New right IJ approach central line catheter is seen at the cavoatrial juncture. No pneumothorax. Stable cardiomegaly with aortic atherosclerosis. Gastric tube is seen coiled below the left hemidiaphragm. The tip is excluded. Endotracheal tube has been repositioned and is now seen approximately 4.7 cm above the carina in satisfactory position. PH or temperature probe projects over the superior mediastinum. Stable interstitial edema. Small right effusion. Apical pleuroparenchymal  opacities are unchanged bilaterally.  IMPRESSION: 1. New right IJ central line catheter is seen at the cavoatrial junction. No pneumothorax. 2. Endotracheal tube and gastric tube in satisfactory position. 3. Stable cardiomegaly with aortic atherosclerosis. 4. Stable mild interstitial edema with small right effusion. Electronically Signed   By: Ashley Royalty M.D.   On: 02/14/2018 19:29   Dg Chest Port 1 View  Result Date: 02/14/2018 CLINICAL DATA:  ETT placement. Hx of afib, aortic stenosis, CAD, chronic diastolic heart failure, hypertension, left bundle branch block, mitral regurgitation, pulmonary hypertension. Nonsmoker. EXAM: PORTABLE CHEST 1 VIEW COMPARISON:  03/03/2018 1433 hours FINDINGS: Endotracheal tube is only 7 mm above the carina. Wall not overtly malposition, I do recommend retraction of 2.0 cm for safety purposes. Another wire projects over the upper chest and could be a esophageal temperature probe. Suspected nasogastric tube extending into the stomach where it may be coiling. Moderate enlargement of the cardiopericardial silhouette with interstitial pulmonary edema and some faint perihilar airspace edema, improved from the earlier exam. IMPRESSION: 1. Improved edema.  Moderate cardiomegaly. 2. Endotracheal tube tip is only 7 mm above the carina. Although not overtly malpositioned, I do recommend retraction of 2 cm for safety purposes. These results will be called to the ordering clinician or representative by the Radiologist Assistant, and communication documented in the PACS or zVision Dashboard. Electronically Signed   By: Van Clines M.D.   On: 03/02/2018 17:02    Microbiology Recent Results (from the past 240 hour(s))  MRSA PCR Screening     Status: None   Collection Time: 02/17/2018  5:02 PM  Result Value Ref Range Status   MRSA by PCR NEGATIVE NEGATIVE Final    Comment:        The GeneXpert MRSA Assay (FDA approved for NASAL specimens only), is one component of a comprehensive MRSA colonization surveillance  program. It is not intended to diagnose MRSA infection nor to guide or monitor treatment for MRSA infections. Performed at [facility], [place] [address] [place], [place] [address]   Urine Culture     Status: Abnormal   Collection Time: 02/28/2018  5:44 PM  Result Value Ref Range Status   Specimen Description URINE, RANDOM  Final   Special Requests   Final    NONE Performed at [facility], [place] [address], [place], [place] [address]    Culture (A)  Final    >=100,000 COLONIES/mL ESCHERICHIA COLI Confirmed Extended Spectrum Beta-Lactamase Producer (ESBL).  In bloodstream infections from ESBL organisms, carbapenems are preferred over piperacillin/tazobactam. They are shown to have a lower risk of mortality.    Report Status 02/18/2018 FINAL  Final   Organism ID, Bacteria ESCHERICHIA COLI (A)  Final      Susceptibility   Escherichia coli - MIC*    AMPICILLIN >=32 RESISTANT Resistant     CEFAZOLIN >=64 RESISTANT Resistant     CEFTRIAXONE >=64 RESISTANT Resistant     CIPROFLOXACIN >=4 RESISTANT Resistant     GENTAMICIN <=1 SENSITIVE Sensitive     IMIPENEM <=0.25 SENSITIVE Sensitive     NITROFURANTOIN <=16 SENSITIVE Sensitive     TRIMETH/SULFA >=320 RESISTANT Resistant     AMPICILLIN/SULBACTAM >=32 RESISTANT Resistant     PIP/TAZO <=4 SENSITIVE Sensitive     Extended ESBL POSITIVE Resistant     * >=100,000 COLONIES/mL ESCHERICHIA COLI  Culture, blood (routine x 2)     Status: None   Collection Time: 02/13/2018  6:30 PM  Result Value Ref  Range Status   Specimen Description BLOOD LEFT WRIST  Final   Special Requests   Final    BOTTLES DRAWN AEROBIC ONLY Blood Culture results may not be optimal due to an inadequate volume of blood received in culture bottles   Culture   Final    NO GROWTH 5 DAYS Performed at [facility], [place] [address], [place], [place] [address]    Report Status 02/21/2018 FINAL  Final  Culture, blood (routine x 2)     Status: None   Collection  Time: 02/14/2018  6:43 PM  Result Value Ref Range Status   Specimen Description BLOOD LEFT HAND  Final   Special Requests   Final    BOTTLES DRAWN AEROBIC ONLY Blood Culture results may not be optimal due to an inadequate volume of blood received in culture bottles   Culture   Final    NO GROWTH 5 DAYS Performed at [facility], [place] [address], [place], [place] [address]    Report Status 02/21/2018 FINAL  Final  Culture, respiratory (non-expectorated)     Status: None   Collection Time: 02-21-18  5:22 AM  Result Value Ref Range Status   Specimen Description TRACHEAL ASPIRATE  Final   Special Requests NONE  Final   Gram Stain   Final    FEW WBC PRESENT, PREDOMINANTLY PMN FEW GRAM POSITIVE COCCI IN PAIRS IN CLUSTERS FEW GRAM NEGATIVE COCCOBACILLI RARE GRAM POSITIVE RODS RARE GRAM NEGATIVE RODS    Culture   Final    FEW Consistent with normal respiratory flora. Performed at [facility], [place] [address] [place], [place] [address]    Report Status 02/18/2018 FINAL  Final    Lab Basic Metabolic Panel: Recent Labs  Lab 2018/02/21 0522  NA 139  K 3.7  CL 98  CO2 20*  GLUCOSE 222*  BUN 35*  CREATININE 3.24*  CALCIUM 7.3*  MG 1.8  PHOS 4.4   Liver Function Tests: No results for input(s): AST, ALT, ALKPHOS, BILITOT, PROT, ALBUMIN in the last 168 hours. No results for input(s): LIPASE, AMYLASE in the last 168 hours. No results for input(s): AMMONIA in the last 168 hours. CBC: Recent Labs  Lab February 21, 2018 0522  WBC 11.6*  HGB 12.8  HCT 39.7  MCV 95.9  PLT 185   Cardiac Enzymes: Recent Labs  Lab Feb 21, 2018 0038 02/21/2018 0522  TROPONINI 14.89* 24.12*   Sepsis Labs: Recent Labs  Lab February 21, 2018 0038 Feb 21, 2018 0522  WBC  --  11.6*  LATICACIDVEN 9.4*  --     Procedures/Operations  11/14 - CVC RIJ   Kearie Mennen L Calan Doren 02/22/2018, 9:26 PM

## 2018-03-04 NOTE — Progress Notes (Signed)
Reason for Consult: ARF Referring Physician:  Valeta Harms, MD  Jody [name] is an 84 y.o. female.  HPI: Jody [name] is an 84yo WF with PMH significant for A fib, CAD, DJD, and frequent UTI's who presented to an outside hospital with a 1 week history generalized malaise.  She was treated for an UTI last weekend but then developed worsening SOB and in ED was noted to be hypoxic, hypotensive and in cardiogenic shock.  She was transferred to [facility] for further evaluation.  We were consulted due to the development of oliguric, ARF, metabolic acidosis, and hyperkalemia.  The PCCM team has already addressed goals of care with her family and they wish to be aggressive initially as she had been living independently prior to admission.  The trend in Scr is seen below.   She is currently intubated and on multiple pressors.   Trend in Creatinine: Creatinine, Ser  Date/Time Value Ref Range Status  03-04-18 05:22 AM 3.24 (H) 0.44 - 1.00 mg/dL Final  02/07/2018 04:50 PM 2.39 (H) 0.44 - 1.00 mg/dL Final  08/02/2017 04:36 AM 0.99 0.44 - 1.00 mg/dL Final  07/24/2017 10:21 AM 1.00 0.44 - 1.00 mg/dL Final  06/23/2017 12:47 PM 1.24 (H) 0.57 - 1.00 mg/dL Final  01/02/2017 01:54 PM 1.08 (H) 0.44 - 1.00 mg/dL Final  09/12/2016 04:13 PM 1.33 (H) 0.57 - 1.00 mg/dL Final  06/10/2016 11:26 AM 0.94 0.57 - 1.00 mg/dL Final  12/14/2015 06:11 AM 1.02 (H) 0.44 - 1.00 mg/dL Final  12/13/2015 11:13 AM 1.01 (H) 0.44 - 1.00 mg/dL Final  12/11/2015 06:11 AM 1.07 (H) 0.44 - 1.00 mg/dL Final  12/10/2015 06:30 AM 0.91 0.44 - 1.00 mg/dL Final  12/08/2015 06:28 PM 1.02 (H) 0.44 - 1.00 mg/dL Final  07/22/2013 09:49 AM 1.00 0.57 - 1.00 mg/dL Final  04/16/2013 03:49 PM 1.4 (H) 0.4 - 1.2 mg/dL Final  08/29/2012 09:37 AM 1.0 0.4 - 1.2 mg/dL Final  07/03/2012 11:21 AM 0.9 0.4 - 1.2 mg/dL Final  04/12/2011 11:33 AM 0.9 0.4 - 1.2 mg/dL Final  12/21/2009 05:42 PM 0.6 0.4 - 1.2 mg/dL Final  02/10/2009 04:50 AM 0.91 0.4 - 1.2 mg/dL Final   02/09/2009 08:08 AM 0.83 0.4 - 1.2 mg/dL Final  02/07/2009 11:16 AM 0.62 0.4 - 1.2 mg/dL Final    PMH:   Past Medical History:  Diagnosis Date  . Afib ([place])   . Anemia   . Aortic stenosis    moderate by 07/19/17 echo  . Arthritis   . CAD (coronary artery disease)   . Chronic diastolic heart failure ([place])   . Dyspnea   . HTN (hypertension)   . LBBB (left bundle branch block)   . Mitral regurgitation    Moderate, echo, 07/2012  . Pulmonary hypertension ([place])   . Varicose veins of both lower extremities     PSH:   Past Surgical History:  Procedure Laterality Date  . CATARACT EXTRACTION Bilateral   . JOINT REPLACEMENT Right Feb 2015   Dr. Case- Ledell Noss  . TOTAL HIP ARTHROPLASTY Left 08/01/2017   Procedure: LEFT TOTAL HIP ARTHROPLASTY ANTERIOR APPROACH;  Surgeon: Mcarthur Rossetti, MD;  Location: [facility];  Service: Orthopedics;  Laterality: Left;  . TOTAL KNEE ARTHROPLASTY Left   . varicose vein laser ablation Bilateral     Allergies:  Allergies  Allergen Reactions  . Levaquin [Levofloxacin] Other (See Comments)    Patient stated vision became blurry and became weak.    Medications:   Prior to Admission medications  Medication Sig Start Date End Date Taking? Authorizing Provider  acetaminophen (TYLENOL) 500 MG tablet Take 500 mg by mouth daily as needed for moderate pain.    [provider]  apixaban (ELIQUIS) 2.5 MG TABS tablet Take 1 tablet (2.5 mg total) by mouth 2 (two) times daily. 11/27/17   Deboraha Sprang, MD  cyproheptadine (PERIACTIN) 2 MG/5ML syrup Take 5 mLs (2 mg total) by mouth every 8 (eight) hours for 7 days, THEN 10 mLs (4 mg total) every 8 (eight) hours. Patient not taking: Reported on 07/19/2017 04/10/17 05/17/17  Sharion Balloon, FNP  cyproheptadine (PERIACTIN) 2 MG/5ML syrup  06/26/17   [provider]  diltiazem (CARTIA XT) 240 MG 24 hr capsule Take 1 capsule (240 mg total) by mouth daily. 07/20/17   Evelina Dun A, FNP  ferrous  sulfate 325 (65 FE) MG tablet Take 1 tablet (325 mg total) by mouth daily. 07/20/17   Sharion Balloon, FNP  furosemide (LASIX) 20 MG tablet Take 1 tablet (20 mg total) by mouth daily as needed for edema. 07/20/17   Sharion Balloon, FNP  traMADol (ULTRAM) 50 MG tablet Take 1-2 tablets (50-100 mg total) by mouth every 6 (six) hours as needed. 08/03/17   Mcarthur Rossetti, MD  vitamin B-12 (CYANOCOBALAMIN) 1000 MCG tablet Take 1,000 mcg by mouth daily.    [provider]    Inpatient medications: . chlorhexidine gluconate (MEDLINE KIT)  15 mL Mouth Rinse BID  . chlorhexidine gluconate (MEDLINE KIT)  15 mL Mouth Rinse BID  . Chlorhexidine Gluconate Cloth  6 each Topical Daily  . insulin aspart  0-9 Units Subcutaneous Q4H  . mouth rinse  15 mL Mouth Rinse 10 times per day  . pantoprazole sodium  40 mg Per Tube Daily  . sodium chloride flush  10-40 mL Intracatheter Q12H    Discontinued Meds:   Medications Discontinued During This Encounter  Medication Reason  . norepinephrine (LEVOPHED) 69m in D5W 2529mpremix infusion   . MEDLINE mouth rinse   . furosemide (LASIX) 250 mg in dextrose 5 % 250 mL (1 mg/mL) infusion     Social History:  reports that she has never smoked. She has never used smokeless tobacco. She reports that she does not drink alcohol or use drugs.  Family History:   Family History  Problem Relation Age of Onset  . Diabetes Mother   . Amblyopia Neg Hx   . Blindness Neg Hx   . Cataracts Neg Hx   . Glaucoma Neg Hx   . Macular degeneration Neg Hx   . Retinal detachment Neg Hx   . Retinitis pigmentosa Neg Hx     Review of systems not obtained due to patient factors. Weight change:   Intake/Output Summary (Last 24 hours) at [id number] Last data filed at 11Dec 08, 2019700 Gross per 24 hour  Intake 1740.6 ml  Output 125 ml  Net 1615.6 ml   BP 96/68   Pulse (!) 133   Temp 98.8 F (37.1 C)   Resp (!) 28   Ht 5' 2" (1.575 m)   Wt 51.9 kg   SpO2  92%   BMI 20.93 kg/m  Vitals:   11Dec 08, 2019630 112019/12/08645 1108-Dec-2019700 11December 08, 2019723  BP: 100/71 94/66 96/68   Pulse: (!) 132 (!) 133 (!) 137 (!) 133  Resp: (!) 26 (!) 23 (!) 21 (!) 28  Temp: 99 F (37.2 C) 98.8 F (37.1 C) 98.8 F (37.1 C)  TempSrc:      SpO2: 91% 93% 94% 92%  Weight:      Height:         General appearance: frail, cachectic WF intubated but awake and alert Head: Normocephalic, without obvious abnormality, atraumatic Resp: rhonchi bibasilar Cardio: tachy at 133 GI: soft, non-tender; bowel sounds normal; no masses,  no organomegaly Extremities: extremities normal, atraumatic, no cyanosis or edema  Labs: Basic Metabolic Panel: Recent Labs  Lab 02/14/2018 1650 2018-03-02 0522  NA 137 139  K 4.9 3.7  CL 107 98  CO2 12* 20*  GLUCOSE 207* 222*  BUN 31* 35*  CREATININE 2.39* 3.24*  ALBUMIN 3.5  --   CALCIUM 8.5* 7.3*  PHOS 8.0* 4.4   Liver Function Tests: Recent Labs  Lab 02/08/2018 1650  AST 1,085*  ALT 885*  ALKPHOS 111  BILITOT 1.0  PROT 6.6  ALBUMIN 3.5   No results for input(s): LIPASE, AMYLASE in the last 168 hours. No results for input(s): AMMONIA in the last 168 hours. CBC: Recent Labs  Lab 03-02-2018 0522  WBC 11.6*  HGB 12.8  HCT 39.7  MCV 95.9  PLT 185   PT/INR: _0 (inr:5) Cardiac Enzymes: ) Recent Labs  Lab 02/20/2018 1650 03/02/18 0038 02-Mar-2018 0522  TROPONINI 3.09* 14.89* 24.12*   CBG: Recent Labs  Lab 02/28/2018 1659 02/07/2018 2042 02/09/2018 2354  GLUCAP 177* 201* 210*    Iron Studies: No results for input(s): IRON, TIBC, TRANSFERRIN, FERRITIN in the last 168 hours.  Xrays/Other Studies: Dg Chest Port 1 View  Result Date: 02/06/2018 CLINICAL DATA:  Encounter for central line placement. EXAM: PORTABLE CHEST 1 VIEW COMPARISON:  1645 hours on the same day FINDINGS: New right IJ approach central line catheter is seen at the cavoatrial juncture. No pneumothorax. Stable cardiomegaly with aortic atherosclerosis.  Gastric tube is seen coiled below the left hemidiaphragm. The tip is excluded. Endotracheal tube has been repositioned and is now seen approximately 4.7 cm above the carina in satisfactory position. PH or temperature probe projects over the superior mediastinum. Stable interstitial edema. Small right effusion. Apical pleuroparenchymal opacities are unchanged bilaterally. IMPRESSION: 1. New right IJ central line catheter is seen at the cavoatrial junction. No pneumothorax. 2. Endotracheal tube and gastric tube in satisfactory position. 3. Stable cardiomegaly with aortic atherosclerosis. 4. Stable mild interstitial edema with small right effusion. Electronically Signed   By: Ashley Royalty M.D.   On: 02/18/2018 19:29   Dg Chest Port 1 View  Result Date: 02/07/2018 CLINICAL DATA:  ETT placement. Hx of afib, aortic stenosis, CAD, chronic diastolic heart failure, hypertension, left bundle branch block, mitral regurgitation, pulmonary hypertension. Nonsmoker. EXAM: PORTABLE CHEST 1 VIEW COMPARISON:  02/25/2018 1433 hours FINDINGS: Endotracheal tube is only 7 mm above the carina. Wall not overtly malposition, I do recommend retraction of 2.0 cm for safety purposes. Another wire projects over the upper chest and could be a esophageal temperature probe. Suspected nasogastric tube extending into the stomach where it may be coiling. Moderate enlargement of the cardiopericardial silhouette with interstitial pulmonary edema and some faint perihilar airspace edema, improved from the earlier exam. IMPRESSION: 1. Improved edema.  Moderate cardiomegaly. 2. Endotracheal tube tip is only 7 mm above the carina. Although not overtly malpositioned, I do recommend retraction of 2 cm for safety purposes. These results will be called to the ordering clinician or representative by the Radiologist Assistant, and communication documented in the PACS or zVision Dashboard. Electronically Signed   By: Cindra Eves.D.  On: 02/26/2018  17:02     Assessment/Plan: 1.  ARF in setting of cardiogenic shock and hypotension.  She is currently anuric with metabolic acidosis and requiring multiple pressors.  I spoke with her daughter who wants to be aggressive initially and see how she does.  They understand how critically ill she is and acknowledges that she may not be able to tolerate CVVHD and would be willing to involve hospice if she does not do well.  2. Metabolic acidosis-  Improved with bicarb but plan for CVVHD 3.  NSTEMI with cardiogenic shock- Cardiology following.  Currently on amiodarone drip and ECHO pending 4. VDRF- per PCCM 5. Cardiogenic shock with ongoing hypotension currently on pressors and milrinone.    Governor Rooks Oluwatomisin Deman 22-Feb-2018, 8:54 AM

## 2018-03-04 NOTE — Progress Notes (Signed)
    ADVANCED HF NOTE  Asked to see patient by Dr. Meda Coffee for worsening cardiogenic shock.  84 y/o woman with h/o CAD, PAF, frequent UTIs who was transferred from [facility] for further management of cardiogenic shock.  She is frail but has been apparently been living independently. Presented with 1 week of progressive anginal symptoms.   Developed respiratory failure and shock.   Troponin climbing now up to 24. Intubated on 3 pressors with NE at 69. SBP in 80s. Sats 85% on 100% FiO2.   Anuric with creatinine 3.24. Trialysis catheter in place.   Lactate currently 6 -> 9.  EF ~30%  ECG AF with RVR and LBBBB  On exam Intubated sedated Frail  RIJ TLC LIJ trialysis Cor tachy irreg + 3s Lungs crackles Ab soft NT/ND Ext. Cachetic. Chronic venous stasis changes  She has profound cardiogenic shock in setting of acute MI complicated by MSOF and worsening lactic acidosis and oxygenation despite aggressive support.   I have discussed 3 options with family 1. Continue current care with aggressive support but no cath 2. Take to cath emergently with possible PCI and IABP 3. Comfort care  Given age and severity off illness, I doubt there is any chance we can have a meaningful outcome here. Family is in agreement and feels that she would now want this level of support. Will continue current level of support until her granddaughter gets here and then switch to comfort care.  CRITICAL CARE Performed by: Glori Bickers  Total critical care time: 60 minutes  Critical care time was exclusive of separately billable procedures and treating other patients.  Critical care was necessary to treat or prevent imminent or life-threatening deterioration.  Critical care was time spent personally by me (independent of midlevel providers or residents) on the following activities: development of treatment plan with patient and/or surrogate as well as nursing, discussions with consultants, evaluation  of patient's response to treatment, examination of patient, obtaining history from patient or surrogate, ordering and performing treatments and interventions, ordering and review of laboratory studies, ordering and review of radiographic studies, pulse oximetry and re-evaluation of patient's condition.  Glori Bickers, MD  12:23 PM

## 2018-03-04 NOTE — Procedures (Signed)
Extubation Procedure Note  Patient Details:   Name: Jody [name] DOB: 03/25/1928 MRN: [id number]   Airway Documentation:    Vent end date: 03/12/2018 Vent end time: 1543   Evaluation  O2 sats: stable throughout Complications: No apparent complications Patient did tolerate procedure well. Bilateral Breath Sounds: Clear   Yes   Patient extubated to 3L [place] without complications at thie time. RN and family at bedside. Will continue to monitor.  Herbie Baltimore 03/12/18, 3:44 PM

## 2018-03-04 NOTE — Progress Notes (Signed)
Patient fighting vent and desating. E-Link and RT called and notified. Awaiting new orders. Will continue to monitor.

## 2018-03-04 NOTE — Progress Notes (Signed)
PCCM Update:  I spoke with nursing. The advance heart failure team has graciously seen the patient. CCM agrees with the recommendations for palliative care.   I have also discussed this with the patients daughter.  I will place withdrawal of life sustaining orders.   Comfort is the only goal  Garner Nash, DO [facility] 03/14/2018 1:57 PM  Personal pager: [phone] If unanswered, please page CCM On-call: [phone]

## 2018-03-04 NOTE — Progress Notes (Signed)
Lyda Perone MD regarding patient desat and fighting vent, see new orders.

## 2018-03-04 NOTE — Progress Notes (Signed)
Elevated HR.  Will attempt echo at a later time.

## 2018-03-04 NOTE — Progress Notes (Signed)
RT attempted recruitment maneuver on patient.

## 2018-03-04 NOTE — Progress Notes (Signed)
Patient is hypotensive despite levo at 40. MAP 59-67 SBP 70's. Remains acidotic per ABG. No urine output thus far. E-Link called and notified at 2145, 2235, and 2301. See new orders.

## 2018-03-04 NOTE — Progress Notes (Signed)
200cc wasted out of Fentanyl bag in the presence of Emer C RN. 12ml of morphine wasted in the presence of Melvern Sample RN.

## 2018-03-04 NOTE — Progress Notes (Addendum)
ANTICOAGULATION CONSULT NOTE - Follow Up Consult  Pharmacy Consult for heparin Indication: chest pain/ACS and atrial fibrillation  Allergies  Allergen Reactions  . Levaquin [Levofloxacin] Other (See Comments)    Patient stated vision became blurry and became weak.    Patient Measurements: Height: '5\' 2"'$  (157.5 cm) Weight: 114 lb 6.7 oz (51.9 kg) IBW/kg (Calculated) : 50.1  Vital Signs: Temp: 98.8 F (37.1 C) (11/15 0700) BP: 96/68 (11/15 0700) Pulse Rate: 133 (11/15 0723)  Labs: Recent Labs    02/02/2018 1650 02/23/2018 1841 2018-03-06 0038 03/06/18 0522 03-06-2018 0748  HGB  --   --   --  12.8  --   HCT  --   --   --  39.7  --   PLT  --   --   --  185  --   APTT  --  35  --   --  66*  HEPARINUNFRC  --   --   --  >2.20*  --   CREATININE 2.39*  --   --  3.24*  --   TROPONINI 3.09*  --  14.89* 24.12*  --     Estimated Creatinine Clearance: 9.5 mL/min (A) (by C-G formula based on SCr of 3.24 mg/dL (H)).   Medical History: Past Medical History:  Diagnosis Date  . Afib ([place])   . Anemia   . Aortic stenosis    moderate by 07/19/17 echo  . Arthritis   . CAD (coronary artery disease)   . Chronic diastolic heart failure ([place])   . Dyspnea   . HTN (hypertension)   . LBBB (left bundle branch block)   . Mitral regurgitation    Moderate, echo, 07/2012  . Pulmonary hypertension ([place])   . Varicose veins of both lower extremities     Medications:  Facility-Administered Medications Prior to Admission  Medication Dose Route Frequency Provider Last Rate Last Dose  . Bevacizumab (AVASTIN) SOLN 1.25 mg  1.25 mg Intravitreal  Bernarda Caffey, MD   1.25 mg at 08/31/17 1242  . Bevacizumab (AVASTIN) SOLN 1.25 mg  1.25 mg Intravitreal  Bernarda Caffey, MD   1.25 mg at 10/01/17 0152  . Bevacizumab (AVASTIN) SOLN 1.25 mg  1.25 mg Intravitreal  Bernarda Caffey, MD   1.25 mg at 10/25/17 1254  . Bevacizumab (AVASTIN) SOLN 1.25 mg  1.25 mg Intravitreal  Bernarda Caffey, MD   1.25 mg at 11/22/17 1216   . Bevacizumab (AVASTIN) SOLN 1.25 mg  1.25 mg Intravitreal  Bernarda Caffey, MD   1.25 mg at 12/27/17 1214  . Bevacizumab (AVASTIN) SOLN 1.25 mg  1.25 mg Intravitreal  Bernarda Caffey, MD   1.25 mg at 02/07/18 1436   Medications Prior to Admission  Medication Sig Dispense Refill Last Dose  . acetaminophen (TYLENOL) 500 MG tablet Take 500 mg by mouth daily as needed for moderate pain.   02/24/2018 at prn  . apixaban (ELIQUIS) 2.5 MG TABS tablet Take 1 tablet (2.5 mg total) by mouth 2 (two) times daily. 180 tablet 1 02/22/2018 at 0500  . diltiazem (CARTIA XT) 240 MG 24 hr capsule Take 1 capsule (240 mg total) by mouth daily. 90 capsule 3 02/21/2018 at Unknown time  . ferrous sulfate 325 (65 FE) MG tablet Take 1 tablet (325 mg total) by mouth daily. 90 tablet 1 02/21/2018 at Unknown time  . furosemide (LASIX) 20 MG tablet Take 1 tablet (20 mg total) by mouth daily as needed for edema. 30 tablet 3 02/14/2018 at prn  . traMADol (  ULTRAM) 50 MG tablet Take 1-2 tablets (50-100 mg total) by mouth every 6 (six) hours as needed. 60 tablet 0 unknown at pen  . vitamin B-12 (CYANOCOBALAMIN) 1000 MCG tablet Take 1,000 mcg by mouth daily.   02/18/2018 at Unknown time  . cyproheptadine (PERIACTIN) 2 MG/5ML syrup Take 5 mLs (2 mg total) by mouth every 8 (eight) hours for 7 days, THEN 10 mLs (4 mg total) every 8 (eight) hours. (Patient not taking: Reported on 07/19/2017) 120 mL 2 Not Taking at Unknown time   Scheduled:  . chlorhexidine gluconate (MEDLINE KIT)  15 mL Mouth Rinse BID  . chlorhexidine gluconate (MEDLINE KIT)  15 mL Mouth Rinse BID  . Chlorhexidine Gluconate Cloth  6 each Topical Daily  . insulin aspart  0-9 Units Subcutaneous Q4H  . mouth rinse  15 mL Mouth Rinse 10 times per day  . pantoprazole sodium  40 mg Per Tube Daily  . sodium chloride flush  10-40 mL Intracatheter Q12H    Assessment: 84yo female on Eliquis PTA for Afib to transition to UFH for NSTEMI complicated by cardiogenic shock. Pt is  currently on IV heparin at 800 units/hr and initial aPTT is low therapeutic. H/H and Plt wnl   Goal of Therapy:  aPTT 66-102 seconds Monitor platelets by anticoagulation protocol: Yes   Plan:  Increase heparin infusion to 850 units/hr and f/u 8 hr aPTT  Monitor heparin levels, aPTT (while Eliquis affects anti-Xa), and CBC.  Albertina Parr, PharmD., BCPS Clinical Pharmacist Clinical phone for 2018-02-26 until 3:30pm: [phone] If after 3:30pm, please refer to [facility] for unit-specific pharmacist   Addendum: Patient to start CRRT today. Will adjust Zosyn dose to 2.25 gm IV Q 6 hours. Monitor CBC, cultures and clinical progress  Albertina Parr, PharmD., BCPS Clinical Pharmacist t

## 2018-03-04 NOTE — Progress Notes (Addendum)
PCCM Interval Progress Note  Called by Warren Lacy MD and asked to assess pt at bedside due to persistent acidemia and hypotension.  S:  On 38mcg/kg/min levophed with SBP in low 70's.  Milrinone just added on by [facility] at 0.38mcg/kg/min.  Last ABG from 2140 was 7.164 / 32 / 118.  She was started on HCO3 at 75cc/hr.  Repeat ABG after 3 amps HCO3 7.43 / 35 / 76.  O: VITAL SIGNS: BP 95/71   Pulse (!) 117   Temp 99 F (37.2 C)   Resp (!) 25   Ht 5\' 2"  (1.575 m)   Wt 54 kg   SpO2 96%   BMI 21.77 kg/m   PHYSICAL EXAM: General:  Adult female, on vent. Neuro:  Awake, follows basic commands. Cardiovascular:  RRR, no M/R/G. Lungs:  Faint crackles left lung base, otherwise clear. Abdomen:  BS x 4, S/NT/ND.  Musculoskeletal:  No deformities, no edema. Skin: Cool and dry.  A / P:  Shock - cardiogenic (co-ox 49). - Continue levophed, have increased ceiling to 50. - Continue milrinone. - Continue bicarb, have increased to 125/hr. - Continue to monitor CVP's. - Follow troponin, lactate. - D/c lasix gtt for now (no UOP thus far and clinically does not appear volume overloaded). - F/u on echo. - Appreciate cardiology assistance. - No escalation given DNR status.  NSTEMI. - Follow troponin. - Will start heparin now and defer head CT (doubt any intracranial process as she is awake and following commands with no neuro deficits on exam).  Respiratory insufficiency. - Continue full vent support. - FiO2 dropped from 70 to 50.  AKI. AGMA. - Hold lasix gtt given no UOP even after 100mg  lasix IV push. - Follow BMP. - Might need renal consult in AM (no emergent needs as acidemia has improved with bicarb).  CC time: 30 min.   Montey Hora, [facility] Pgr: [phone]  or [phone] Mar 15, 2018, 12:21 AM

## 2018-03-04 NOTE — Progress Notes (Signed)
Progress Note  Patient Name: Jody [name] Date of Encounter: February 22, 2018  Primary Cardiologist: New patient  Subjective   Worsening condition overnight with increased demands for levophed, adding vasopressin, milrinone, anuria, received HD catheter. Respond to family members despite low dose fentanyl.  Inpatient Medications    Scheduled Meds: . chlorhexidine gluconate (MEDLINE KIT)  15 mL Mouth Rinse BID  . chlorhexidine gluconate (MEDLINE KIT)  15 mL Mouth Rinse BID  . Chlorhexidine Gluconate Cloth  6 each Topical Daily  . insulin aspart  0-9 Units Subcutaneous Q4H  . mouth rinse  15 mL Mouth Rinse 10 times per day  . midazolam      . midazolam  2 mg Intravenous Once  . pantoprazole sodium  40 mg Per Tube Daily  . sodium chloride flush  10-40 mL Intracatheter Q12H   Continuous Infusions: .  prismasol BGK 4/2.5    .  prismasol BGK 4/2.5    . fentaNYL infusion INTRAVENOUS 75 mcg/hr (Feb 22, 2018 0700)  . heparin 800 Units/hr (2018-02-22 0700)  . heparin    . milrinone 0.25 mcg/kg/min (22-Feb-2018 0700)  . norepinephrine (LEVOPHED) Adult infusion 45 mcg/min (2018/02/22 0928)  . piperacillin-tazobactam (ZOSYN)  IV 12.5 mL/hr at 02-22-2018 0700  . prismasol BGK 4/2.5    .  sodium bicarbonate  infusion 1000 mL 125 mL/hr at 22-Feb-2018 0701  . vasopressin (PITRESSIN) infusion - *FOR SHOCK* 0.04 Units/min (02/22/2018 1036)   PRN Meds: bisacodyl, docusate, fentaNYL (SUBLIMAZE) injection, heparin, heparin, ipratropium-albuterol, sodium chloride flush   Vital Signs    Vitals:   2018/02/22 0645 22-Feb-2018 0700 02/22/18 0723 2018/02/22 0857  BP: 94/66 96/68    Pulse: (!) 133 (!) 137 (!) 133   Resp: (!) 23 (!) 21 (!) 28   Temp: 98.8 F (37.1 C) 98.8 F (37.1 C)    TempSrc:      SpO2: 93% 94% 92% 92%  Weight:      Height:        Intake/Output Summary (Last 24 hours) at 2018/02/22 1104 Last data filed at 2018-02-22 0700 Gross per 24 hour  Intake 1740.6 ml  Output 125 ml  Net 1615.6 ml    Filed Weights   02/05/2018 1800 2018/02/22 0600  Weight: 54 kg 51.9 kg    Telemetry    A-fib with RVR 150'- Personally Reviewed  ECG    A-fib, RVR, LBBB - Personally Reviewed  Physical Exam  Intubated sedated Central line and HD catheter in place Pupils not dilated Cardiac: iRRR, tachy,   Respiratory: Crackles B/L GI: Soft, nontender, non-distended Cold extremities, non-palpable peripheral pulses   Labs    Chemistry Recent Labs  Lab 02/27/2018 1650 February 22, 2018 0522  NA 137 139  K 4.9 3.7  CL 107 98  CO2 12* 20*  GLUCOSE 207* 222*  BUN 31* 35*  CREATININE 2.39* 3.24*  CALCIUM 8.5* 7.3*  PROT 6.6  --   ALBUMIN 3.5  --   AST 1,085*  --   ALT 885*  --   ALKPHOS 111  --   BILITOT 1.0  --   GFRNONAA 17* 12*  GFRAA 20* 14*  ANIONGAP 18* 21*     Hematology Recent Labs  Lab 2018/02/22 0522  WBC 11.6*  RBC 4.14  HGB 12.8  HCT 39.7  MCV 95.9  MCH 30.9  MCHC 32.2  RDW 14.2  PLT 185    Cardiac Enzymes Recent Labs  Lab 02/09/2018 1650 02/22/18 0038 Feb 22, 2018 0522  TROPONINI 3.09* 14.89* 24.12*   No  results for input(s): TROPIPOC in the last 168 hours.   BNPNo results for input(s): BNP, PROBNP in the last 168 hours.   DDimer No results for input(s): DDIMER in the last 168 hours.   Radiology    Dg Chest Port 1 View  Result Date: 02/17/18 CLINICAL DATA:  Intubation. EXAM: PORTABLE CHEST 1 VIEW COMPARISON:  02/10/2018. FINDINGS: Endotracheal tube, NG tube, right IJ line in stable position. Cardiomegaly with diffuse bilateral pulmonary infiltrates consistent pulmonary edema again noted. Small bilateral pleural effusions again noted. IMPRESSION: 1.  Lines and tubes in stable position. 2. Persistent changes of congestive heart failure with bilateral pulmonary edema and small pleural effusions. No interim change from prior exam. Electronically Signed   By: Marcello Moores  Register   On: 17-Feb-2018 09:41   Dg Chest Port 1 View  Result Date: 02/04/2018 CLINICAL DATA:   Encounter for central line placement. EXAM: PORTABLE CHEST 1 VIEW COMPARISON:  1645 hours on the same day FINDINGS: New right IJ approach central line catheter is seen at the cavoatrial juncture. No pneumothorax. Stable cardiomegaly with aortic atherosclerosis. Gastric tube is seen coiled below the left hemidiaphragm. The tip is excluded. Endotracheal tube has been repositioned and is now seen approximately 4.7 cm above the carina in satisfactory position. PH or temperature probe projects over the superior mediastinum. Stable interstitial edema. Small right effusion. Apical pleuroparenchymal opacities are unchanged bilaterally. IMPRESSION: 1. New right IJ central line catheter is seen at the cavoatrial junction. No pneumothorax. 2. Endotracheal tube and gastric tube in satisfactory position. 3. Stable cardiomegaly with aortic atherosclerosis. 4. Stable mild interstitial edema with small right effusion. Electronically Signed   By: Ashley Royalty M.D.   On: 02/08/2018 19:29   Dg Chest Port 1 View  Result Date: 02/19/2018 CLINICAL DATA:  ETT placement. Hx of afib, aortic stenosis, CAD, chronic diastolic heart failure, hypertension, left bundle branch block, mitral regurgitation, pulmonary hypertension. Nonsmoker. EXAM: PORTABLE CHEST 1 VIEW COMPARISON:  02/04/2018 1433 hours FINDINGS: Endotracheal tube is only 7 mm above the carina. Wall not overtly malposition, I do recommend retraction of 2.0 cm for safety purposes. Another wire projects over the upper chest and could be a esophageal temperature probe. Suspected nasogastric tube extending into the stomach where it may be coiling. Moderate enlargement of the cardiopericardial silhouette with interstitial pulmonary edema and some faint perihilar airspace edema, improved from the earlier exam. IMPRESSION: 1. Improved edema.  Moderate cardiomegaly. 2. Endotracheal tube tip is only 7 mm above the carina. Although not overtly malpositioned, I do recommend retraction of 2  cm for safety purposes. These results will be called to the ordering clinician or representative by the Radiologist Assistant, and communication documented in the PACS or zVision Dashboard. Electronically Signed   By: Van Clines M.D.   On: 02/11/2018 17:02   Cardiac Studies    Patient Profile     84 y.o. female with a hx of permament atrial fibrillation on chronic anticoagulation with Eliquis, moderate AS, s/p left great saphenous vein ablation on 07/13/2017, LBBB, HTN, and chronic diastolic CHF, patient of Dr. Caryl Comes last seen in March 2019 for preoperative clearance when her LVEF was 55 to 60%.  The patient is fully independent living alone, she went to the ER at [facility] on 02/10/2018 with chest pain was rule out for MI and discharged home.  She developed acute onset chest pain radiating to her neck and associated with a headache this morning, she went back to the ER where she was  found to be in cardiogenic shock, hypotensive, hypoxic started on levophed, echocardiogram showed LVEF of 30 to 35%, she was originally on BiPAP but failed and was eventually intubated.  She was transferred to [facility] for further therapy. EKG shows atrial fibrillation with left bundle branch block and RVR, unchanged EKG from prior. Initial labs show creatinine of 2.39, baseline 0.99, severely elevated LFTs, AST 1085, ALT 885, troponin is 3, lactic acid 6.6, hemoglobin is pending.  Normal potassium and sodium.   Assessment & Plan    Non-STEMI complicated by cardiogenic shock Permanent atrial fibrillation now with RVR Acute respiratory failure Acute kidney failure with anuria  The patient deteriorated overnight, Levophed requirements up now at 45 mcg/min, Milrinone was started at 0.25, Coox 50, improved to 70 at midnight. Vasopressin was added this am, CVP 10. Crea 2.39 -> 3.24 with anuria (20 cc overnight), dialysis catheter was just placed and CVVHD is scheduled shortly.  Telemetry shows  a-fib, now with increased ventricular rates in 150.  Lactice acid further elavated 6-> 9.  Plan: Consider left/right heart catheterization and potentially IABP Continue checking Coox, continue milrinone at current dose.  Start amiodarone drip without a bolus given hypotension Echocardiogram is pending Given multi organ failure including heart, kidneys, liver and lung failure, patient's age and severely elevated lactic acid her prognosis is very poor.  For questions or updates, please contact [facility] Please consult [email or web address] for contact info under     Signed, Ena Dawley, MD  03/07/18, 11:04 AM

## 2018-03-04 NOTE — Procedures (Signed)
Hemodialysis Insertion Procedure Note Jody [name] [id number] [birth year]  Procedure: Insertion of Hemodialysis Catheter Type: 3 port  Indications: Hemodialysis   Procedure Details Consent: Risks of procedure as well as the alternatives and risks of each were explained to the (patient/caregiver).  Consent for procedure obtained. Time Out: Verified patient identification, verified procedure, site/side was marked, verified correct patient position, special equipment/implants available, medications/allergies/relevent history reviewed, required imaging and test results available.  Performed  Maximum sterile technique was used including antiseptics, cap, gloves, gown, hand hygiene, mask and sheet. Skin prep: Chlorhexidine; local anesthetic administered A antimicrobial bonded/coated triple lumen catheter was placed in the left internal jugular vein using the Seldinger technique. Ultrasound guidance used.Yes.   Catheter placed to 20 cm. Blood aspirated via all 3 ports and then flushed x 3. Line sutured x 2 and dressing applied.  Evaluation Blood flow good Complications: No apparent complications Patient did tolerate procedure well. Chest X-ray ordered to verify placement.  CXR: pending.       Jody [name] Jody [name] ACNP Maryanna Shape PCCM Pager [phone] till 1 pm If no answer page [phone] 2018-03-18, 10:31 AM

## 2018-03-04 DEATH — deceased

## 2018-03-19 ENCOUNTER — Ambulatory Visit (INDEPENDENT_AMBULATORY_CARE_PROVIDER_SITE_OTHER): Payer: Medicare Other | Admitting: Orthopaedic Surgery

## 2018-04-02 ENCOUNTER — Encounter (INDEPENDENT_AMBULATORY_CARE_PROVIDER_SITE_OTHER): Payer: Medicare Other | Admitting: Ophthalmology
